# Patient Record
Sex: Female | Born: 1975
Health system: Southern US, Community
[De-identification: ages and names within clinical notes are randomized; demographics above are authoritative.]

## PROBLEM LIST (undated history)

## (undated) DIAGNOSIS — L4050 Arthropathic psoriasis, unspecified: Secondary | ICD-10-CM

## (undated) DIAGNOSIS — E119 Type 2 diabetes mellitus without complications: Secondary | ICD-10-CM

## (undated) DIAGNOSIS — R519 Headache, unspecified: Secondary | ICD-10-CM

## (undated) DIAGNOSIS — I10 Essential (primary) hypertension: Secondary | ICD-10-CM

## (undated) DIAGNOSIS — R112 Nausea with vomiting, unspecified: Secondary | ICD-10-CM

## (undated) DIAGNOSIS — Z9889 Other specified postprocedural states: Secondary | ICD-10-CM

## (undated) HISTORY — PX: BACK SURGERY: SHX140

## (undated) HISTORY — PX: KNEE SURGERY: SHX244

---

## 2015-11-18 ENCOUNTER — Ambulatory Visit: Payer: Self-pay | Admitting: Cardiovascular Disease

## 2016-03-31 DIAGNOSIS — G43109 Migraine with aura, not intractable, without status migrainosus: Secondary | ICD-10-CM | POA: Diagnosis not present

## 2016-03-31 DIAGNOSIS — R21 Rash and other nonspecific skin eruption: Secondary | ICD-10-CM | POA: Diagnosis not present

## 2016-03-31 DIAGNOSIS — M2550 Pain in unspecified joint: Secondary | ICD-10-CM | POA: Diagnosis not present

## 2016-03-31 DIAGNOSIS — E119 Type 2 diabetes mellitus without complications: Secondary | ICD-10-CM | POA: Diagnosis not present

## 2016-03-31 DIAGNOSIS — R768 Other specified abnormal immunological findings in serum: Secondary | ICD-10-CM | POA: Diagnosis not present

## 2016-03-31 DIAGNOSIS — F418 Other specified anxiety disorders: Secondary | ICD-10-CM | POA: Diagnosis not present

## 2016-03-31 DIAGNOSIS — I10 Essential (primary) hypertension: Secondary | ICD-10-CM | POA: Diagnosis not present

## 2016-04-12 DIAGNOSIS — L409 Psoriasis, unspecified: Secondary | ICD-10-CM | POA: Diagnosis not present

## 2016-04-12 DIAGNOSIS — L400 Psoriasis vulgaris: Secondary | ICD-10-CM | POA: Diagnosis not present

## 2016-04-21 DIAGNOSIS — Z79899 Other long term (current) drug therapy: Secondary | ICD-10-CM | POA: Diagnosis not present

## 2016-04-21 DIAGNOSIS — M25562 Pain in left knee: Secondary | ICD-10-CM | POA: Diagnosis not present

## 2016-04-21 DIAGNOSIS — R760 Raised antibody titer: Secondary | ICD-10-CM | POA: Diagnosis not present

## 2016-04-21 DIAGNOSIS — L4050 Arthropathic psoriasis, unspecified: Secondary | ICD-10-CM | POA: Diagnosis not present

## 2016-04-21 DIAGNOSIS — R7989 Other specified abnormal findings of blood chemistry: Secondary | ICD-10-CM | POA: Diagnosis not present

## 2016-04-21 DIAGNOSIS — M79641 Pain in right hand: Secondary | ICD-10-CM | POA: Diagnosis not present

## 2016-04-21 DIAGNOSIS — M25561 Pain in right knee: Secondary | ICD-10-CM | POA: Diagnosis not present

## 2016-04-21 DIAGNOSIS — M79642 Pain in left hand: Secondary | ICD-10-CM | POA: Diagnosis not present

## 2016-05-12 DIAGNOSIS — R7989 Other specified abnormal findings of blood chemistry: Secondary | ICD-10-CM | POA: Diagnosis not present

## 2016-05-12 DIAGNOSIS — L4050 Arthropathic psoriasis, unspecified: Secondary | ICD-10-CM | POA: Diagnosis not present

## 2016-05-12 DIAGNOSIS — Z79899 Other long term (current) drug therapy: Secondary | ICD-10-CM | POA: Diagnosis not present

## 2016-06-08 MED FILL — LABETALOL HCL 200 MG TABLET: 200 | 90 days supply | Qty: 90 | Fill #0

## 2016-06-08 MED FILL — SERTRALINE HCL 100 MG TAB: 100 | 90 days supply | Qty: 90 | Fill #0

## 2016-06-08 MED FILL — predniSONE 10 MG TABS: 10 | 30 days supply | Qty: 60 | Fill #0

## 2016-06-11 MED FILL — TRIAMCINOLONE 0.1% CREAM: 0.1 | 30 days supply | Qty: 454 | Fill #0

## 2016-06-11 MED FILL — metFORMIN HCL 500 MG TABS: 500 | 90 days supply | Qty: 90 | Fill #0

## 2016-06-11 MED FILL — SUMATRIPTAN SUCC 100 MG TAB: 100 | 30 days supply | Qty: 18 | Fill #0

## 2016-07-05 DIAGNOSIS — G43109 Migraine with aura, not intractable, without status migrainosus: Secondary | ICD-10-CM | POA: Diagnosis not present

## 2016-07-05 DIAGNOSIS — F418 Other specified anxiety disorders: Secondary | ICD-10-CM | POA: Diagnosis not present

## 2016-07-05 DIAGNOSIS — Z7984 Long term (current) use of oral hypoglycemic drugs: Secondary | ICD-10-CM | POA: Diagnosis not present

## 2016-07-05 DIAGNOSIS — E119 Type 2 diabetes mellitus without complications: Secondary | ICD-10-CM | POA: Diagnosis not present

## 2016-07-05 DIAGNOSIS — L4050 Arthropathic psoriasis, unspecified: Secondary | ICD-10-CM | POA: Diagnosis not present

## 2016-07-05 DIAGNOSIS — I10 Essential (primary) hypertension: Secondary | ICD-10-CM | POA: Diagnosis not present

## 2016-07-05 MED FILL — LOSARTAN-HCTZ 50-12.5 MG TA: 50-12.5 | 30 days supply | Qty: 30 | Fill #0

## 2016-08-03 MED FILL — TRIAMCINOLONE 0.1% CREAM: 0.1 | 30 days supply | Qty: 454 | Fill #1

## 2016-08-03 MED FILL — LOSARTAN-HCTZ 50-12.5 MG TA: 50-12.5 | 30 days supply | Qty: 30 | Fill #1

## 2016-09-06 MED FILL — LOSARTAN-HCTZ 50-12.5 MG TA: 50-12.5 | 30 days supply | Qty: 30 | Fill #2

## 2016-09-09 MED FILL — TRIAMCINOLONE 0.1% CREAM: 0.1 | 30 days supply | Qty: 454 | Fill #2

## 2016-09-09 MED FILL — SERTRALINE HCL 100 MG TAB: 100 | 90 days supply | Qty: 90 | Fill #1

## 2016-09-20 MED FILL — metFORMIN HCL 500 MG TABS: 500 | 90 days supply | Qty: 90 | Fill #1

## 2016-09-21 DIAGNOSIS — E119 Type 2 diabetes mellitus without complications: Secondary | ICD-10-CM | POA: Diagnosis not present

## 2016-09-21 DIAGNOSIS — R5383 Other fatigue: Secondary | ICD-10-CM | POA: Diagnosis not present

## 2016-09-21 DIAGNOSIS — M7989 Other specified soft tissue disorders: Secondary | ICD-10-CM | POA: Diagnosis not present

## 2016-09-21 DIAGNOSIS — M2550 Pain in unspecified joint: Secondary | ICD-10-CM | POA: Diagnosis not present

## 2016-09-21 DIAGNOSIS — L4050 Arthropathic psoriasis, unspecified: Secondary | ICD-10-CM | POA: Diagnosis not present

## 2016-09-21 DIAGNOSIS — L409 Psoriasis, unspecified: Secondary | ICD-10-CM | POA: Diagnosis not present

## 2016-09-21 DIAGNOSIS — Z6841 Body Mass Index (BMI) 40.0 and over, adult: Secondary | ICD-10-CM | POA: Diagnosis not present

## 2016-09-26 ENCOUNTER — Encounter: Payer: Self-pay | Admitting: Emergency Medicine

## 2016-09-26 ENCOUNTER — Emergency Department (INDEPENDENT_AMBULATORY_CARE_PROVIDER_SITE_OTHER)
Admission: EM | Admit: 2016-09-26 | Discharge: 2016-09-26 | Disposition: A | Discharge status: Home / Self Care | Payer: 59 | Source: Home / Self Care | Attending: Family Medicine | Admitting: Family Medicine

## 2016-09-26 DIAGNOSIS — L02211 Cutaneous abscess of abdominal wall: Secondary | ICD-10-CM | POA: Diagnosis not present

## 2016-09-26 HISTORY — DX: Essential (primary) hypertension: I10

## 2016-09-26 HISTORY — DX: Type 2 diabetes mellitus without complications: E11.9

## 2016-09-26 HISTORY — DX: Arthropathic psoriasis, unspecified: L40.50

## 2016-09-26 MED ORDER — OXYCODONE-ACETAMINOPHEN 5-325 MG PO TABS: 1.0000 | ORAL_TABLET | Freq: Four times a day (QID) | ORAL | 0 refills | Status: AC | PRN

## 2016-09-26 MED ORDER — CLINDAMYCIN HCL 300 MG PO CAPS: 300.0000 mg | ORAL_CAPSULE | Freq: Three times a day (TID) | ORAL | 0 refills | Status: AC

## 2016-09-26 NOTE — Discharge Instructions (Signed)
Leave bandage in place for 24 hours, then change daily until healed.  May apply heating pad 3 to 4 times daily.  May take Ibuprofen 200mg , 4 tabs every 8 hours with food.   If symptoms become significantly worse during the night or over the weekend, proceed to the local emergency room.

## 2016-09-26 NOTE — ED Triage Notes (Signed)
Patient presents to [facility] with a abscess to the right abdomen

## 2016-09-26 NOTE — ED Provider Notes (Signed)
[facility]    CSN: [id number] Arrival date & time: 09/26/16  1526     History   Chief Complaint Chief Complaint  Patient presents with  . Abscess    HPI Kirsten Rodriguez is a 41 y.o. female.   Patient reports the onset of a "bump" on her right lower abdomen one week ago.  The area became increasingly painful and swollen, especially during the past three days.  She has psoriasis, and has several plaques on her abdomen.  She has had sweats during the past 24 hours.   The history is provided by the patient.  Abscess  Abscess location: right lower abdomen. Size:  3cm by 6cm Abscess quality: draining, fluctuance, induration, painful, redness, warmth and weeping   Abscess quality: no itching   Red streaking: no   Duration:  1 week Progression:  Worsening Pain details:    Quality:  Aching   Severity:  Moderate   Duration:  1 week   Timing:  Constant   Progression:  Worsening Chronicity:  New Context: not skin injury   Relieved by:  Nothing Worsened by:  Draining/squeezing Ineffective treatments:  None tried Associated symptoms: fatigue   Associated symptoms: no fever, no headaches, no nausea and no vomiting     Past Medical History:  Diagnosis Date  . Diabetes mellitus without complication ([place])   . Hypertension   . Psoriatic arthritis ([place])     There are no active problems to display for this patient.   Past Surgical History:  Procedure Laterality Date  . BACK SURGERY    . KNEE SURGERY      OB History    No data available       Home Medications    Prior to Admission medications   Medication Sig Start Date End Date Taking? Authorizing Provider  losartan-hydrochlorothiazide (HYZAAR) 50-12.5 MG tablet Take 1 tablet by mouth daily.   Yes [provider]  metFORMIN (GLUCOPHAGE) 500 MG tablet Take by mouth 2 (two) times daily with a meal.   Yes [provider]  sertraline (ZOLOFT) 50 MG tablet Take 50 mg by mouth daily.   Yes  [provider]  SUMAtriptan (IMITREX) 50 MG tablet Take 50 mg by mouth every 2 (two) hours as needed for migraine. May repeat in 2 hours if headache persists or recurs.   Yes [provider]  clindamycin (CLEOCIN) 300 MG capsule Take 1 capsule (300 mg total) by mouth 3 (three) times daily. 09/26/16 10/06/16  Kandra Nicolas, MD  oxyCODONE-acetaminophen (ROXICET) 5-325 MG tablet Take 1 tablet by mouth every 6 (six) hours as needed for moderate pain. 09/26/16 09/26/17  Kandra Nicolas, MD    Family History History reviewed. No pertinent family history.  Social History Social History  Substance Use Topics  . Smoking status: Former Research scientist (life sciences)  . Smokeless tobacco: Former Systems developer  . Alcohol use Yes     Comment: 5-6 per week      Allergies   Patient has no known allergies.   Review of Systems Review of Systems  Constitutional: Positive for fatigue. Negative for fever.  Gastrointestinal: Negative for nausea and vomiting.  Neurological: Negative for headaches.  All other systems reviewed and are negative.    Physical Exam Triage Vital Signs ED Triage Vitals  Enc Vitals Group     BP 09/26/16 1549 116/81     Pulse Rate 09/26/16 1549 (!) 118     Resp 09/26/16 1549 16     Temp  09/26/16 1549 98.2 F (36.8 C)     Temp Source 09/26/16 1549 Oral     SpO2 09/26/16 1549 98 %     Weight 09/26/16 1552 286 lb (129.7 kg)     Height 09/26/16 1552 5\' 9"  (1.753 m)     Head Circumference --      Peak Flow --      Pain Score 09/26/16 1552 7     Pain Loc --      Pain Edu? --      Excl. in [place]? --    No data found.   Updated Vital Signs BP 116/81 (BP Location: Left Arm)   Pulse (!) 118   Temp 98.2 F (36.8 C) (Oral)   Resp 16   Ht 5\' 9"  (1.753 m)   Wt 286 lb (129.7 kg)   LMP 09/08/2016   SpO2 98%   BMI 42.23 kg/m   Visual Acuity Right Eye Distance:   Left Eye Distance:   Bilateral Distance:    Right Eye Near:   Left Eye Near:    Bilateral Near:     Physical Exam    Constitutional: She appears well-developed and well-nourished. No distress.  HENT:  Head: Normocephalic.  Right Ear: External ear normal.  Left Ear: External ear normal.  Nose: Nose normal.  Mouth/Throat: Oropharynx is clear and moist.  Eyes: Pupils are equal, round, and reactive to light. Conjunctivae are normal.  Neck: Neck supple.  Cardiovascular: Normal heart sounds.   Pulmonary/Chest: Breath sounds normal.  Abdominal: Bowel sounds are normal.    Area of tenderness and fluctuance right lower quadrant surrounded by erythema as noted on diagram.  There are several scattered psoriatic plaques on abdomen.  Lymphadenopathy:    She has no cervical adenopathy.  Neurological: She is alert.  Skin: Skin is warm and dry.  Nursing note and vitals reviewed.    UC Treatments / Results  Labs (all labs ordered are listed, but only abnormal results are displayed) Labs Reviewed  WOUND CULTURE    EKG  EKG Interpretation None       Radiology No results found.  Procedures Procedures  Incise and drain cyst/abscess Risks and benefits of procedure explained to patient and verbal consent obtained.  Using sterile technique, applied topical refrigerant spray followed by local anesthesia with 1% lidocaine with epinephrine, and cleansed affected area with Betadine and saline. Identified the most fluctuant area of lesion and incised with #11 blade.    Expressed blood and purulent material.  Inserted Iodoform gauze packing.  Bandage applied.  Patient tolerated well   Medications Ordered in UC Medications - No data to display   Initial Impression / Assessment and Plan / UC Course  I have reviewed the triage vital signs and the nursing notes.  Pertinent labs & imaging results that were available during my care of the patient were reviewed by me and considered in my medical decision making (see chart for details).    Patient's initial cellulitis may have been a result of infected psoriatic  plaque. Wound culture pending. Begin Clindamycin 300mg  TID for staph coverage. Rx for Percocet (#12, no ref) Leave bandage in place for 24 hours, then change daily until healed.  May apply heating pad 3 to 4 times daily.  May take Ibuprofen 200mg , 4 tabs every 8 hours with food.   If symptoms become significantly worse during the night or over the weekend, proceed to the local emergency room.  Return 24 hours for dressing change  and packing removal.    Final Clinical Impressions(s) / UC Diagnoses   Final diagnoses:  Abscess of skin of abdomen    New Prescriptions New Prescriptions   CLINDAMYCIN (CLEOCIN) 300 MG CAPSULE    Take 1 capsule (300 mg total) by mouth 3 (three) times daily.   OXYCODONE-ACETAMINOPHEN (ROXICET) 5-325 MG TABLET    Take 1 tablet by mouth every 6 (six) hours as needed for moderate pain.     Kandra Nicolas, MD 10/08/16 [phone]

## 2016-09-29 ENCOUNTER — Telehealth: Payer: Self-pay

## 2016-09-29 LAB — WOUND CULTURE
GRAM STAIN: NONE SEEN
Gram Stain: NONE SEEN

## 2016-09-29 NOTE — Telephone Encounter (Signed)
Called patient, notified of wound cx results, and to continue tx until finished.  Will follow up as needed.

## 2016-10-04 DIAGNOSIS — L02211 Cutaneous abscess of abdominal wall: Secondary | ICD-10-CM | POA: Diagnosis not present

## 2016-10-04 DIAGNOSIS — M2550 Pain in unspecified joint: Secondary | ICD-10-CM | POA: Diagnosis not present

## 2016-10-04 DIAGNOSIS — L4050 Arthropathic psoriasis, unspecified: Secondary | ICD-10-CM | POA: Diagnosis not present

## 2016-10-04 DIAGNOSIS — E119 Type 2 diabetes mellitus without complications: Secondary | ICD-10-CM | POA: Diagnosis not present

## 2016-10-04 DIAGNOSIS — R5383 Other fatigue: Secondary | ICD-10-CM | POA: Diagnosis not present

## 2016-10-04 DIAGNOSIS — M7989 Other specified soft tissue disorders: Secondary | ICD-10-CM | POA: Diagnosis not present

## 2016-10-04 DIAGNOSIS — L409 Psoriasis, unspecified: Secondary | ICD-10-CM | POA: Diagnosis not present

## 2016-10-04 DIAGNOSIS — Z6841 Body Mass Index (BMI) 40.0 and over, adult: Secondary | ICD-10-CM | POA: Diagnosis not present

## 2016-10-04 MED FILL — SULFAMETHOXAZOLE/TMP DS TAB: 800-160 | 10 days supply | Qty: 20 | Fill #0

## 2016-10-19 MED FILL — LOSARTAN-HCTZ 50-12.5 MG TA: 50-12.5 | 30 days supply | Qty: 30 | Fill #0

## 2016-11-09 DIAGNOSIS — E119 Type 2 diabetes mellitus without complications: Secondary | ICD-10-CM | POA: Diagnosis not present

## 2016-11-09 DIAGNOSIS — I10 Essential (primary) hypertension: Secondary | ICD-10-CM | POA: Diagnosis not present

## 2016-11-09 DIAGNOSIS — Z7984 Long term (current) use of oral hypoglycemic drugs: Secondary | ICD-10-CM | POA: Diagnosis not present

## 2016-11-16 DIAGNOSIS — M5441 Lumbago with sciatica, right side: Secondary | ICD-10-CM | POA: Diagnosis not present

## 2016-11-16 DIAGNOSIS — G8929 Other chronic pain: Secondary | ICD-10-CM | POA: Diagnosis not present

## 2016-11-22 MED FILL — SUMATRIPTAN SUCC 100 MG TAB: 100 | 90 days supply | Qty: 24 | Fill #0

## 2016-11-22 MED FILL — LOSARTAN-HCTZ 50-12.5 MG TA: 50-12.5 | 90 days supply | Qty: 90 | Fill #0

## 2016-12-14 DIAGNOSIS — J069 Acute upper respiratory infection, unspecified: Secondary | ICD-10-CM | POA: Diagnosis not present

## 2016-12-20 MED FILL — metFORMIN HCL 500 MG TABS: 500 | 90 days supply | Qty: 90 | Fill #0

## 2016-12-30 MED FILL — SERTRALINE HCL 100 MG TAB: 100 | 90 days supply | Qty: 90 | Fill #2

## 2017-01-04 DIAGNOSIS — R5383 Other fatigue: Secondary | ICD-10-CM | POA: Diagnosis not present

## 2017-01-04 DIAGNOSIS — Z6841 Body Mass Index (BMI) 40.0 and over, adult: Secondary | ICD-10-CM | POA: Diagnosis not present

## 2017-01-04 DIAGNOSIS — E119 Type 2 diabetes mellitus without complications: Secondary | ICD-10-CM | POA: Diagnosis not present

## 2017-01-04 DIAGNOSIS — L02211 Cutaneous abscess of abdominal wall: Secondary | ICD-10-CM | POA: Diagnosis not present

## 2017-01-04 DIAGNOSIS — M2550 Pain in unspecified joint: Secondary | ICD-10-CM | POA: Diagnosis not present

## 2017-01-04 DIAGNOSIS — L409 Psoriasis, unspecified: Secondary | ICD-10-CM | POA: Diagnosis not present

## 2017-01-04 DIAGNOSIS — L4050 Arthropathic psoriasis, unspecified: Secondary | ICD-10-CM | POA: Diagnosis not present

## 2017-02-21 MED FILL — LOSARTAN-HCTZ 50-12.5 MG TA: 50-12.5 | 90 days supply | Qty: 90 | Fill #1

## 2017-03-04 DIAGNOSIS — M545 Low back pain: Secondary | ICD-10-CM | POA: Diagnosis not present

## 2017-03-12 DIAGNOSIS — M5416 Radiculopathy, lumbar region: Secondary | ICD-10-CM | POA: Diagnosis not present

## 2017-03-17 MED FILL — metFORMIN HCL 500 MG TABS: 500 | 90 days supply | Qty: 90 | Fill #1

## 2017-03-18 DIAGNOSIS — M545 Low back pain: Secondary | ICD-10-CM | POA: Diagnosis not present

## 2017-03-21 DIAGNOSIS — M545 Low back pain: Secondary | ICD-10-CM | POA: Diagnosis not present

## 2017-03-22 DIAGNOSIS — M5416 Radiculopathy, lumbar region: Secondary | ICD-10-CM | POA: Diagnosis not present

## 2017-03-29 MED FILL — SERTRALINE HCL 100 MG TAB: 100 | 90 days supply | Qty: 90 | Fill #3

## 2017-04-06 DIAGNOSIS — M5416 Radiculopathy, lumbar region: Secondary | ICD-10-CM | POA: Diagnosis not present

## 2017-04-21 DIAGNOSIS — M47816 Spondylosis without myelopathy or radiculopathy, lumbar region: Secondary | ICD-10-CM | POA: Diagnosis not present

## 2017-05-20 MED FILL — TRIAMCINOLONE ACETONIDE 0.1: 0.1 | 30 days supply | Qty: 454 | Fill #3

## 2017-05-24 MED FILL — LOSARTAN-HCTZ 50-12.5 MG TA: 50-12.5 | 90 days supply | Qty: 90 | Fill #0

## 2017-06-02 DIAGNOSIS — E119 Type 2 diabetes mellitus without complications: Secondary | ICD-10-CM | POA: Diagnosis not present

## 2017-06-02 DIAGNOSIS — I10 Essential (primary) hypertension: Secondary | ICD-10-CM | POA: Diagnosis not present

## 2017-06-08 DIAGNOSIS — Z13220 Encounter for screening for lipoid disorders: Secondary | ICD-10-CM | POA: Diagnosis not present

## 2017-06-08 DIAGNOSIS — R748 Abnormal levels of other serum enzymes: Secondary | ICD-10-CM | POA: Diagnosis not present

## 2017-06-08 DIAGNOSIS — R7309 Other abnormal glucose: Secondary | ICD-10-CM | POA: Diagnosis not present

## 2017-06-09 ENCOUNTER — Other Ambulatory Visit: Payer: Self-pay | Admitting: Family Medicine

## 2017-06-09 DIAGNOSIS — R748 Abnormal levels of other serum enzymes: Secondary | ICD-10-CM

## 2017-06-09 MED FILL — metFORMIN HCL 500 MG TABS: 500 | 90 days supply | Qty: 360 | Fill #0

## 2017-06-13 ENCOUNTER — Encounter (HOSPITAL_BASED_OUTPATIENT_CLINIC_OR_DEPARTMENT_OTHER): Payer: Self-pay

## 2017-06-13 ENCOUNTER — Ambulatory Visit (HOSPITAL_BASED_OUTPATIENT_CLINIC_OR_DEPARTMENT_OTHER)
Admission: RE | Admit: 2017-06-13 | Discharge: 2017-06-13 | Disposition: A | Discharge status: Home / Self Care | Payer: 59 | Source: Ambulatory Visit | Attending: Family Medicine | Admitting: Family Medicine

## 2017-06-13 DIAGNOSIS — R748 Abnormal levels of other serum enzymes: Secondary | ICD-10-CM | POA: Insufficient documentation

## 2017-06-13 DIAGNOSIS — R161 Splenomegaly, not elsewhere classified: Secondary | ICD-10-CM | POA: Insufficient documentation

## 2017-06-13 DIAGNOSIS — K8020 Calculus of gallbladder without cholecystitis without obstruction: Secondary | ICD-10-CM | POA: Diagnosis not present

## 2017-06-16 DIAGNOSIS — R748 Abnormal levels of other serum enzymes: Secondary | ICD-10-CM | POA: Diagnosis not present

## 2017-06-30 DIAGNOSIS — R1011 Right upper quadrant pain: Secondary | ICD-10-CM | POA: Diagnosis not present

## 2017-06-30 DIAGNOSIS — R748 Abnormal levels of other serum enzymes: Secondary | ICD-10-CM | POA: Diagnosis not present

## 2017-06-30 DIAGNOSIS — E119 Type 2 diabetes mellitus without complications: Secondary | ICD-10-CM | POA: Diagnosis not present

## 2017-06-30 DIAGNOSIS — R161 Splenomegaly, not elsewhere classified: Secondary | ICD-10-CM | POA: Diagnosis not present

## 2017-06-30 DIAGNOSIS — Z124 Encounter for screening for malignant neoplasm of cervix: Secondary | ICD-10-CM | POA: Diagnosis not present

## 2017-06-30 DIAGNOSIS — Z0000 Encounter for general adult medical examination without abnormal findings: Secondary | ICD-10-CM | POA: Diagnosis not present

## 2017-07-08 MED FILL — SERTRALINE HCL 100 MG TAB: 100 | 30 days supply | Qty: 30 | Fill #0

## 2017-07-12 ENCOUNTER — Other Ambulatory Visit: Payer: Self-pay | Admitting: Family Medicine

## 2017-07-12 DIAGNOSIS — R161 Splenomegaly, not elsewhere classified: Secondary | ICD-10-CM

## 2017-08-05 MED FILL — SUMATRIPTAN SUCC 100 MG TAB: 100 | 30 days supply | Qty: 9 | Fill #0

## 2017-08-22 MED FILL — SERTRALINE HCL 100 MG TAB: 100 | 30 days supply | Qty: 30 | Fill #1

## 2017-08-23 MED FILL — LOSARTAN-HCTZ 50-12.5 MG TA: 50-12.5 | 90 days supply | Qty: 90 | Fill #0

## 2017-09-14 MED FILL — SUMATRIPTAN SUCC 100 MG TAB: 100 | 30 days supply | Qty: 9 | Fill #1

## 2017-09-23 MED FILL — SERTRALINE HCL 100 MG TAB: 100 | 30 days supply | Qty: 30 | Fill #2

## 2017-09-23 MED FILL — metFORMIN HCL 500 MG TABS: 500 | 90 days supply | Qty: 360 | Fill #1

## 2017-10-17 MED FILL — SUMATRIPTAN SUCC 100 MG TAB: 100 | 30 days supply | Qty: 9 | Fill #2

## 2017-11-03 MED FILL — SERTRALINE HCL 100 MG TAB: 100 | 30 days supply | Qty: 30 | Fill #3

## 2017-11-21 MED FILL — LOSARTAN-HCTZ 50-12.5 MG TA: 50-12.5 | 90 days supply | Qty: 90 | Fill #0

## 2017-11-29 ENCOUNTER — Telehealth: Payer: Self-pay | Admitting: Pharmacist

## 2017-11-29 DIAGNOSIS — Z9119 Patient's noncompliance with other medical treatment and regimen: Secondary | ICD-10-CM | POA: Diagnosis not present

## 2017-11-29 DIAGNOSIS — L409 Psoriasis, unspecified: Secondary | ICD-10-CM | POA: Diagnosis not present

## 2017-11-29 DIAGNOSIS — L4050 Arthropathic psoriasis, unspecified: Secondary | ICD-10-CM | POA: Diagnosis not present

## 2017-11-29 DIAGNOSIS — Z6841 Body Mass Index (BMI) 40.0 and over, adult: Secondary | ICD-10-CM | POA: Diagnosis not present

## 2017-11-29 DIAGNOSIS — R5383 Other fatigue: Secondary | ICD-10-CM | POA: Diagnosis not present

## 2017-11-29 DIAGNOSIS — L02211 Cutaneous abscess of abdominal wall: Secondary | ICD-10-CM | POA: Diagnosis not present

## 2017-11-29 DIAGNOSIS — E119 Type 2 diabetes mellitus without complications: Secondary | ICD-10-CM | POA: Diagnosis not present

## 2017-11-29 DIAGNOSIS — M2550 Pain in unspecified joint: Secondary | ICD-10-CM | POA: Diagnosis not present

## 2017-11-29 NOTE — Telephone Encounter (Signed)
Called patient to schedule an appointment for the [facility]. I was unable to reach the patient so I left a HIPAA-compliant message requesting that the patient return my call.   

## 2017-12-06 NOTE — Telephone Encounter (Signed)
Called patient to schedule an appointment for the [place] [facility]. I was unable to reach the patient so I left a HIPAA-compliant message requesting that the patient return my call. Second attempt.  

## 2017-12-09 ENCOUNTER — Ambulatory Visit: Payer: 59 | Admitting: Pharmacist

## 2017-12-09 DIAGNOSIS — Z79899 Other long term (current) drug therapy: Secondary | ICD-10-CM

## 2017-12-09 MED ORDER — IXEKIZUMAB 80 MG/ML ~~LOC~~ SOSY: 1.0000 mL | PREFILLED_SYRINGE | SUBCUTANEOUS | 2 refills | Status: DC

## 2017-12-09 NOTE — Progress Notes (Signed)
   S: Patient presents to [facility] [place] for review of their specialty medication therapy.  Patient is currently taking Taltz for psoriatic arthritis. Patient is managed by Marella Chimes for this.   Adherence: denies any issues - has taken in the past  Efficacy: reports that it worked well for her in the past. Did not have any adverse effects while on it.   Dosing:  Psoriatic arthritis: SubQ: 160 mg once, followed by 80 mg every 4 weeks; may administer alone or in combination with conventional disease-modifying antirheumatic drugs (eg, methotrexate).   Dose adjustments: Renal: no dose adjustments (has not been studied) Hepatic: no dose adjustments (has not been studied)   Screening: TB test: completed per patient  Monitoring: S/sx of infection: denies CBC: monitored by [facility] rheumatology S/sx of hypersensitivity: denies   O:     No results found for: WBC, HGB, HCT, MCV, PLT    Chemistry   No results found for: NA, K, CL, CO2, BUN, CREATININE, GLU No results found for: CALCIUM, ALKPHOS, AST, ALT, BILITOT     A/P: 1. Medication review: Patient currently on [place] for psoriatic arthritis. Reviewed the medication with the patient, including the following: Donnetta Hail is a monoclonal antibody used in the treatment of ankylosing spondylitis, plaque psoriasis, and psoriatic arthritis. Administration instructions were provided: Subcutaneous: Allow to reach room temperature prior to injection (30 minutes). Do not shake. Inject full amount into the upper arms, thighs or any quadrant of the abdomen; administer each injection at a different anatomic location than a previous injection and avoid areas where the skin is tender, bruised, erythematous, indurated, or affected by psoriasis. Administration in the upper, outer arm may be performed by a caregiver or health care provider. Ixekizumab is intended for use under the guidance and supervision of a physician; may be self-injected by the  patient following proper training in SubQ injection technique. Possible adverse effects include neutropenia, increased risk of infection, local injection site reaction, and antibody development (loss of efficacy of drug). May increase risk of Crohn disease or ulcerative colitis. Live vaccinations should be avoided. No recommendations for any changes.     Christella Hartigan, PharmD, BCPS, BCACP, CPP Clinical Pharmacist Practitioner  [phone]

## 2017-12-12 MED FILL — SUMAtriptan SUCCINATE 100 M: 100 | 90 days supply | Qty: 27 | Fill #0

## 2017-12-12 MED FILL — SERTRALINE HCL 100 MG TAB: 100 | 30 days supply | Qty: 30 | Fill #0

## 2017-12-21 DIAGNOSIS — I10 Essential (primary) hypertension: Secondary | ICD-10-CM | POA: Diagnosis not present

## 2017-12-21 DIAGNOSIS — R300 Dysuria: Secondary | ICD-10-CM | POA: Diagnosis not present

## 2017-12-21 DIAGNOSIS — L4050 Arthropathic psoriasis, unspecified: Secondary | ICD-10-CM | POA: Diagnosis not present

## 2017-12-21 DIAGNOSIS — Z23 Encounter for immunization: Secondary | ICD-10-CM | POA: Diagnosis not present

## 2017-12-21 DIAGNOSIS — K8020 Calculus of gallbladder without cholecystitis without obstruction: Secondary | ICD-10-CM | POA: Diagnosis not present

## 2017-12-21 DIAGNOSIS — K760 Fatty (change of) liver, not elsewhere classified: Secondary | ICD-10-CM | POA: Diagnosis not present

## 2017-12-21 DIAGNOSIS — B370 Candidal stomatitis: Secondary | ICD-10-CM | POA: Diagnosis not present

## 2017-12-21 DIAGNOSIS — E119 Type 2 diabetes mellitus without complications: Secondary | ICD-10-CM | POA: Diagnosis not present

## 2017-12-21 DIAGNOSIS — R748 Abnormal levels of other serum enzymes: Secondary | ICD-10-CM | POA: Diagnosis not present

## 2017-12-26 ENCOUNTER — Other Ambulatory Visit (HOSPITAL_COMMUNITY): Payer: Self-pay | Admitting: Family Medicine

## 2017-12-26 DIAGNOSIS — R161 Splenomegaly, not elsewhere classified: Secondary | ICD-10-CM

## 2017-12-26 DIAGNOSIS — R945 Abnormal results of liver function studies: Principal | ICD-10-CM

## 2017-12-26 DIAGNOSIS — R7989 Other specified abnormal findings of blood chemistry: Secondary | ICD-10-CM

## 2018-01-03 ENCOUNTER — Encounter (HOSPITAL_COMMUNITY): Payer: Self-pay

## 2018-01-03 ENCOUNTER — Ambulatory Visit (HOSPITAL_COMMUNITY)
Admission: RE | Admit: 2018-01-03 | Discharge: 2018-01-03 | Disposition: A | Discharge status: Home / Self Care | Payer: 59 | Source: Ambulatory Visit | Attending: Family Medicine | Admitting: Family Medicine

## 2018-01-03 ENCOUNTER — Other Ambulatory Visit (HOSPITAL_COMMUNITY): Payer: Self-pay | Admitting: Family Medicine

## 2018-01-03 DIAGNOSIS — R162 Hepatomegaly with splenomegaly, not elsewhere classified: Secondary | ICD-10-CM | POA: Insufficient documentation

## 2018-01-03 DIAGNOSIS — R161 Splenomegaly, not elsewhere classified: Secondary | ICD-10-CM

## 2018-01-03 DIAGNOSIS — K760 Fatty (change of) liver, not elsewhere classified: Secondary | ICD-10-CM | POA: Diagnosis not present

## 2018-01-03 DIAGNOSIS — R7989 Other specified abnormal findings of blood chemistry: Secondary | ICD-10-CM

## 2018-01-03 DIAGNOSIS — R945 Abnormal results of liver function studies: Secondary | ICD-10-CM | POA: Diagnosis present

## 2018-01-03 DIAGNOSIS — R748 Abnormal levels of other serum enzymes: Secondary | ICD-10-CM | POA: Diagnosis not present

## 2018-01-03 LAB — POCT I-STAT CREATININE: Creatinine, Ser: 0.5 mg/dL (ref 0.44–1.00)

## 2018-01-03 MED ORDER — IOHEXOL 300 MG/ML  SOLN
100.0000 mL | Freq: Once | INTRAMUSCULAR | Status: AC | PRN
  Administered 2018-01-03: 100 mL via INTRAVENOUS

## 2018-01-04 ENCOUNTER — Other Ambulatory Visit (HOSPITAL_COMMUNITY): Payer: Self-pay | Admitting: Family Medicine

## 2018-01-04 DIAGNOSIS — R160 Hepatomegaly, not elsewhere classified: Secondary | ICD-10-CM

## 2018-01-06 ENCOUNTER — Ambulatory Visit (HOSPITAL_COMMUNITY)
Admission: RE | Admit: 2018-01-06 | Discharge: 2018-01-06 | Disposition: A | Discharge status: Home / Self Care | Payer: 59 | Source: Ambulatory Visit | Attending: Family Medicine | Admitting: Family Medicine

## 2018-01-06 DIAGNOSIS — R162 Hepatomegaly with splenomegaly, not elsewhere classified: Secondary | ICD-10-CM | POA: Diagnosis not present

## 2018-01-06 DIAGNOSIS — R160 Hepatomegaly, not elsewhere classified: Secondary | ICD-10-CM

## 2018-01-06 DIAGNOSIS — K760 Fatty (change of) liver, not elsewhere classified: Secondary | ICD-10-CM | POA: Diagnosis not present

## 2018-01-06 DIAGNOSIS — K8020 Calculus of gallbladder without cholecystitis without obstruction: Secondary | ICD-10-CM | POA: Insufficient documentation

## 2018-01-06 DIAGNOSIS — K769 Liver disease, unspecified: Secondary | ICD-10-CM | POA: Insufficient documentation

## 2018-01-06 DIAGNOSIS — I868 Varicose veins of other specified sites: Secondary | ICD-10-CM | POA: Diagnosis not present

## 2018-01-06 MED ORDER — GADOXETATE DISODIUM 0.25 MMOL/ML IV SOLN
10.0000 mL | Freq: Once | INTRAVENOUS | Status: AC
  Administered 2018-01-06: 10 mL via INTRAVENOUS

## 2018-01-18 MED FILL — SERTRALINE HCL 100 MG TAB: 100 | 30 days supply | Qty: 30 | Fill #0

## 2018-02-09 MED FILL — metFORMIN HCL 500 MG TABS: 500 | 90 days supply | Qty: 360 | Fill #2

## 2018-02-17 MED FILL — LOSARTAN POTASSIUM 50 MG TA: 50 | 90 days supply | Qty: 90 | Fill #0

## 2018-02-17 MED FILL — HYDROCHLOROTHIAZIDE 12.5 MG: 12.5 | 90 days supply | Qty: 90 | Fill #0

## 2018-03-02 MED FILL — SERTRALINE HCL 100 MG TAB: 100 | 30 days supply | Qty: 30 | Fill #0

## 2018-03-21 ENCOUNTER — Telehealth: Payer: 59 | Admitting: Family Medicine

## 2018-03-21 DIAGNOSIS — J069 Acute upper respiratory infection, unspecified: Secondary | ICD-10-CM

## 2018-03-21 MED ORDER — FLUTICASONE PROPIONATE 50 MCG/ACT NA SUSP: 2.0000 | Freq: Every day | NASAL | 0 refills | Status: DC

## 2018-03-21 MED ORDER — BENZONATATE 100 MG PO CAPS: 100.0000 mg | ORAL_CAPSULE | Freq: Two times a day (BID) | ORAL | 0 refills | Status: DC | PRN

## 2018-03-21 MED FILL — FLUTICASONE PROP 50 MCG SPR: 50 | 30 days supply | Qty: 16 | Fill #0

## 2018-03-21 MED FILL — BENZONATATE 100 MG CAPS: 100 | 10 days supply | Qty: 20 | Fill #0

## 2018-03-21 NOTE — Progress Notes (Signed)
We are sorry you are not feeling well.  Here is how we plan to help!  Based on what you have shared with me, it looks like you may have a viral upper respiratory infection or a "common cold".  Colds are caused by a large number of viruses; however, rhinovirus is the most common cause.   Symptoms of the common cold vary from person to person, with common symptoms including sore throat, cough, and malaise.  A low-grade fever of 100.4 may present, but is often uncommon.  Symptoms vary however, and are closely related to a person's age or underlying illnesses.  The most common symptoms associated with the common cold are nasal discharge or congestion, cough, sneezing, headache and pressure in the ears and face.  Cold symptoms usually persist for about 3 to 10 days, but can last up to 2 weeks.  It is important to know that colds do not cause serious illness or complications in most cases.    The common cold is transmitted from person to person, with the most common method of transmission being a person's hands.  The virus is able to live on the skin and can infect other persons for up to 2 hours after direct contact.  Also, colds are transmitted when someone coughs or sneezes; thus, it is important to cover the mouth to reduce this risk.  To keep the spread of the common cold at [place], good hand hygiene is very important.  This is an infection that is most likely caused by a virus. There are no specific treatments for the common cold other than to help you with the symptoms until the infection runs its course.    For nasal congestion, you may use an oral decongestants such as Mucinex D or if you have glaucoma or high blood pressure use plain Mucinex.  Saline nasal spray or nasal drops can help and can safely be used as often as needed for congestion.  For your congestion, I have prescribed Fluticasone nasal spray one spray in each nostril twice a day  If you do not have a history of heart disease, hypertension,  diabetes or thyroid disease, prostate/bladder issues or glaucoma, you may also use Sudafed to treat nasal congestion.  It is highly recommended that you consult with a pharmacist or your primary care physician to ensure this medication is safe for you to take.     If you have a cough, you may use cough suppressants such as Delsym and Robitussin.  If you have glaucoma or high blood pressure, you can also use Coricidin HBP.   For cough I have prescribed for you A prescription cough medication called Tessalon Perles 100 mg. You may take 1-2 capsules every 8 hours as needed for cough  If you have a sore or scratchy throat, use a saltwater gargle-  to  teaspoon of salt dissolved in a 4-ounce to 8-ounce glass of warm water.  Gargle the solution for approximately 15-30 seconds and then spit.  It is important not to swallow the solution.  You can also use throat lozenges/cough drops and Chloraseptic spray to help with throat pain or discomfort.  Warm or cold liquids can also be helpful in relieving throat pain.  For headache, pain or general discomfort, you can use Ibuprofen or Tylenol as directed.   Some authorities believe that zinc sprays or the use of Echinacea may shorten the course of your symptoms.   HOME CARE . Only take medications as instructed by your  medical team. . Be sure to drink plenty of fluids. Water is fine as well as fruit juices, sodas and electrolyte beverages. You may want to stay away from caffeine or alcohol. If you are nauseated, try taking small sips of liquids. How do you know if you are getting enough fluid? Your urine should be a pale yellow or almost colorless. . Get rest. . Taking a steamy shower or using a humidifier may help nasal congestion and ease sore throat pain. You can place a towel over your head and breathe in the steam from hot water coming from a faucet. . Using a saline nasal spray works much the same way. . Cough drops, hard candies and sore throat lozenges  may ease your cough. . Avoid close contacts especially the very young and the elderly . Cover your mouth if you cough or sneeze . Always remember to wash your hands.   GET HELP RIGHT AWAY IF: . You develop worsening fever. . If your symptoms do not improve within 10 days . You develop yellow or green discharge from your nose over 3 days. . You have coughing fits . You develop a severe head ache or visual changes. . You develop shortness of breath, difficulty breathing or start having chest pain . Your symptoms persist after you have completed your treatment plan  MAKE SURE YOU   Understand these instructions.  Will watch your condition.  Will get help right away if you are not doing well or get worse.  Your e-visit answers were reviewed by a board certified advanced clinical practitioner to complete your personal care plan. Depending upon the condition, your plan could have included both over the counter or prescription medications. Please review your pharmacy choice. If there is a problem, you may call our nursing hot line at and have the prescription routed to another pharmacy. Your safety is important to [place]. If you have drug allergies check your prescription carefully.   You can use MyChart to ask questions about today's visit, request a non-urgent call back, or ask for a work or school excuse for 24 hours related to this e-Visit. If it has been greater than 24 hours you will need to follow up with your provider, or enter a new e-Visit to address those concerns. You will get an e-mail in the next two days asking about your experience.  I hope that your e-visit has been valuable and will speed your recovery. Thank you for using e-visits.

## 2018-03-28 MED FILL — AMOXICILLIN 500 MG CAPSULE: 500 | 10 days supply | Qty: 20 | Fill #0

## 2018-04-24 DIAGNOSIS — L409 Psoriasis, unspecified: Secondary | ICD-10-CM | POA: Diagnosis not present

## 2018-04-24 DIAGNOSIS — Z6841 Body Mass Index (BMI) 40.0 and over, adult: Secondary | ICD-10-CM | POA: Diagnosis not present

## 2018-04-24 DIAGNOSIS — R5383 Other fatigue: Secondary | ICD-10-CM | POA: Diagnosis not present

## 2018-04-24 DIAGNOSIS — E119 Type 2 diabetes mellitus without complications: Secondary | ICD-10-CM | POA: Diagnosis not present

## 2018-04-24 DIAGNOSIS — L4050 Arthropathic psoriasis, unspecified: Secondary | ICD-10-CM | POA: Diagnosis not present

## 2018-04-24 DIAGNOSIS — Z9119 Patient's noncompliance with other medical treatment and regimen: Secondary | ICD-10-CM | POA: Diagnosis not present

## 2018-04-24 DIAGNOSIS — L02211 Cutaneous abscess of abdominal wall: Secondary | ICD-10-CM | POA: Diagnosis not present

## 2018-04-24 DIAGNOSIS — M2550 Pain in unspecified joint: Secondary | ICD-10-CM | POA: Diagnosis not present

## 2018-05-19 MED FILL — SUMATRIPTAN SUCC 100 MG TAB: 100 | 90 days supply | Qty: 27 | Fill #0

## 2018-05-19 MED FILL — SERTRALINE HCL 100 MG TAB: 100 | 90 days supply | Qty: 90 | Fill #0

## 2018-05-23 MED FILL — LOSARTAN POTASSIUM 50 MG TA: 50 | 30 days supply | Qty: 30 | Fill #0

## 2018-05-23 MED FILL — HYDROCHLOROTHIAZIDE 12.5 MG: 12.5 | 30 days supply | Qty: 30 | Fill #0

## 2018-06-26 MED FILL — LOSARTAN POTASSIUM 50 MG TA: 50 | 30 days supply | Qty: 30 | Fill #1

## 2018-06-26 MED FILL — HYDROCHLOROTHIAZIDE 12.5 MG: 12.5 | 30 days supply | Qty: 30 | Fill #1

## 2018-07-20 MED FILL — LOSARTAN POTASSIUM 50 MG TA: 50 | 30 days supply | Qty: 30 | Fill #0

## 2018-07-20 MED FILL — HYDROCHLOROTHIAZIDE 12.5 MG: 12.5 | 30 days supply | Qty: 30 | Fill #2

## 2018-07-31 MED FILL — metFORMIN HCL 500 MG TABS: 500 | 90 days supply | Qty: 360 | Fill #0

## 2018-08-19 MED FILL — HYDROCHLOROTHIAZIDE 12.5 MG: 12.5 | 30 days supply | Qty: 30 | Fill #0

## 2018-08-29 MED FILL — SERTRALINE HCL 100 MG TAB: 100 | 90 days supply | Qty: 90 | Fill #0

## 2018-08-29 MED FILL — LOSARTAN-HCTZ 50-12.5 MG TA: 50-12.5 | 30 days supply | Qty: 30 | Fill #0

## 2018-08-29 MED FILL — SUMAtriptan SUCCINATE 100 M: 100 | 90 days supply | Qty: 27 | Fill #0

## 2018-09-29 MED FILL — LOSARTAN-HCTZ 50-12.5 MG TA: 50-12.5 | 30 days supply | Qty: 30 | Fill #1

## 2018-10-18 DIAGNOSIS — Z13220 Encounter for screening for lipoid disorders: Secondary | ICD-10-CM | POA: Diagnosis not present

## 2018-10-18 DIAGNOSIS — E119 Type 2 diabetes mellitus without complications: Secondary | ICD-10-CM | POA: Diagnosis not present

## 2018-10-18 DIAGNOSIS — D1803 Hemangioma of intra-abdominal structures: Secondary | ICD-10-CM | POA: Diagnosis not present

## 2018-10-18 DIAGNOSIS — I10 Essential (primary) hypertension: Secondary | ICD-10-CM | POA: Diagnosis not present

## 2018-10-18 DIAGNOSIS — Z0000 Encounter for general adult medical examination without abnormal findings: Secondary | ICD-10-CM | POA: Diagnosis not present

## 2018-10-18 DIAGNOSIS — L4050 Arthropathic psoriasis, unspecified: Secondary | ICD-10-CM | POA: Diagnosis not present

## 2018-10-18 DIAGNOSIS — F418 Other specified anxiety disorders: Secondary | ICD-10-CM | POA: Diagnosis not present

## 2018-10-18 DIAGNOSIS — Z1231 Encounter for screening mammogram for malignant neoplasm of breast: Secondary | ICD-10-CM | POA: Diagnosis not present

## 2018-10-23 ENCOUNTER — Other Ambulatory Visit: Payer: Self-pay | Admitting: Family Medicine

## 2018-10-23 DIAGNOSIS — Z1231 Encounter for screening mammogram for malignant neoplasm of breast: Secondary | ICD-10-CM

## 2018-10-23 DIAGNOSIS — D1803 Hemangioma of intra-abdominal structures: Secondary | ICD-10-CM

## 2018-10-27 MED FILL — LOSARTAN-HCTZ 50-12.5 MG TA: 50-12.5 | 30 days supply | Qty: 30 | Fill #2

## 2018-11-08 DIAGNOSIS — J069 Acute upper respiratory infection, unspecified: Secondary | ICD-10-CM | POA: Diagnosis not present

## 2018-11-14 DIAGNOSIS — J40 Bronchitis, not specified as acute or chronic: Secondary | ICD-10-CM | POA: Diagnosis not present

## 2018-11-14 MED FILL — AZITHROMYCIN 250 MG TABLET: 250 | 5 days supply | Qty: 6 | Fill #0

## 2018-11-29 DIAGNOSIS — E1169 Type 2 diabetes mellitus with other specified complication: Secondary | ICD-10-CM | POA: Diagnosis not present

## 2018-11-29 DIAGNOSIS — R05 Cough: Secondary | ICD-10-CM | POA: Diagnosis not present

## 2018-11-29 DIAGNOSIS — I10 Essential (primary) hypertension: Secondary | ICD-10-CM | POA: Diagnosis not present

## 2018-11-30 DIAGNOSIS — R05 Cough: Secondary | ICD-10-CM | POA: Diagnosis not present

## 2018-12-01 MED FILL — SERTRALINE HCL 100 MG TAB: 100 | 90 days supply | Qty: 90 | Fill #1

## 2018-12-01 MED FILL — LOSARTAN-HCTZ 50-12.5 MG TA: 50-12.5 | 30 days supply | Qty: 30 | Fill #3

## 2018-12-06 MED FILL — metFORMIN HCL 1000 MG TABS: 1000 | 90 days supply | Qty: 180 | Fill #0

## 2018-12-06 MED FILL — SAXENDA 18 MG/3 ML PEN: 18 | 75 days supply | Qty: 15 | Fill #0

## 2018-12-07 MED FILL — UNIFINE PENTIPS 32GX5/32": 32G X 4 MM | 90 days supply | Qty: 100 | Fill #0

## 2018-12-07 MED FILL — UNIFINE PENTIPS 32GX5/32: 32G X 4 MM | 90 days supply | Qty: 100 | Fill #0

## 2018-12-15 MED FILL — SUMAtriptan SUCCINATE 100 M: 100 | 90 days supply | Qty: 27 | Fill #1

## 2018-12-28 MED FILL — LOSARTAN-HCTZ 50-12.5 MG TA: 50-12.5 | 30 days supply | Qty: 30 | Fill #4

## 2019-01-02 MED FILL — SUMAtriptan SUCCINATE 100 M: 100 | 90 days supply | Qty: 27 | Fill #1

## 2019-02-02 MED FILL — LOSARTAN-HCTZ 50-12.5 MG TA: 50-12.5 | 30 days supply | Qty: 30 | Fill #5

## 2019-02-27 MED FILL — SERTRALINE HCL 100 MG TAB: 100 | 90 days supply | Qty: 90 | Fill #2

## 2019-03-05 MED FILL — LOSARTAN-HCTZ 50-12.5 MG TA: 50-12.5 | 30 days supply | Qty: 30 | Fill #6

## 2019-03-13 MED FILL — UNIFINE PENTIPS 32GX5/32: 32G X 4 MM | 90 days supply | Qty: 100 | Fill #1

## 2019-03-13 MED FILL — SAXENDA 18 MG/3 ML PEN: 18 | 75 days supply | Qty: 15 | Fill #1

## 2019-03-28 DIAGNOSIS — E1169 Type 2 diabetes mellitus with other specified complication: Secondary | ICD-10-CM | POA: Diagnosis not present

## 2019-03-28 DIAGNOSIS — K760 Fatty (change of) liver, not elsewhere classified: Secondary | ICD-10-CM | POA: Diagnosis not present

## 2019-04-02 IMAGING — CT CT ABD-PELV W/ CM
2 of 5 series · 15 of 46 positions shown, 17 images · IV contrast (APPLIED)
Comparison: Abdominal ultrasound June 13, 2017

CLINICAL DATA: Elevated liver enzymes.  History of splenomegaly

EXAM:
CT ABDOMEN AND PELVIS WITH CONTRAST
TECHNIQUE: Multidetector CT imaging of the abdomen and pelvis was performed
using the standard protocol following bolus administration of
intravenous contrast.
CONTRAST:  100mL OMNIPAQUE IOHEXOL 300 MG/ML  SOLN

[Series 3: abdomen 5.0 · axial · 0.90mm/px · z∈[+839,+1239]mm · 12 of 94 slices shown, 14 images]
[im 7/94  soft-tissue]
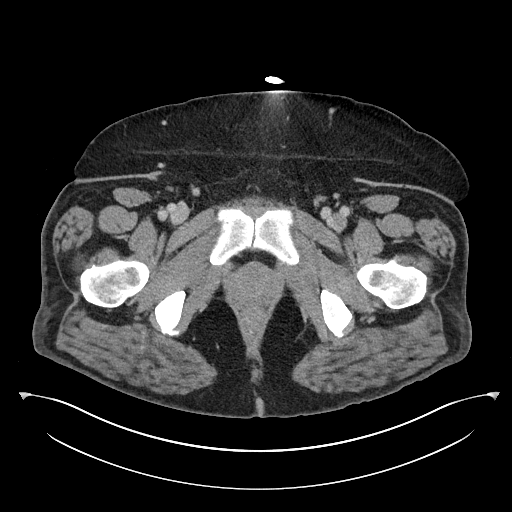
[im 7/94  bone]
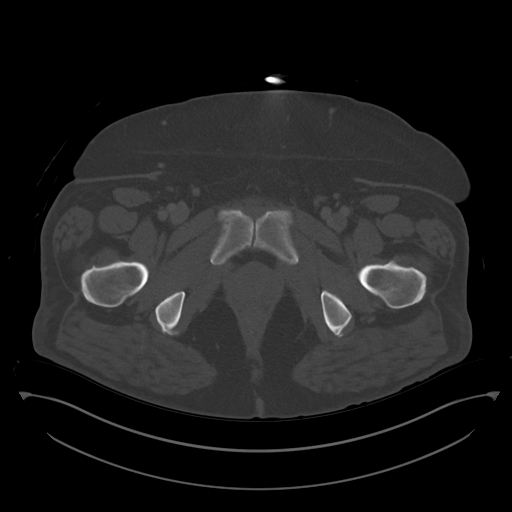
[im 13/94  soft-tissue]
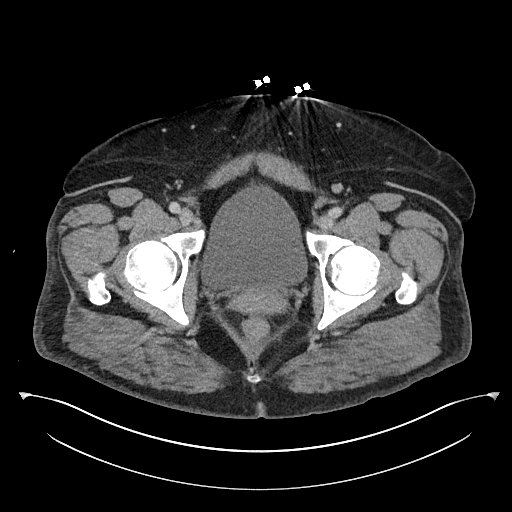
[im 19/94  soft-tissue]
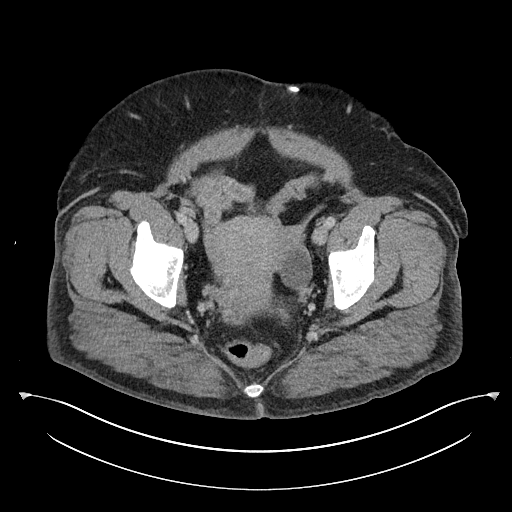
[im 32/94  soft-tissue]
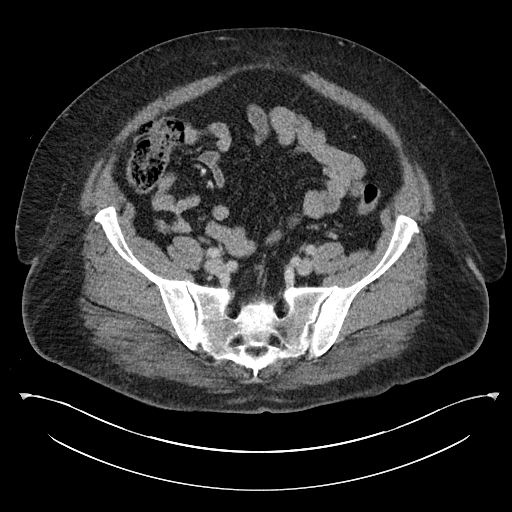
[im 38/94  soft-tissue]
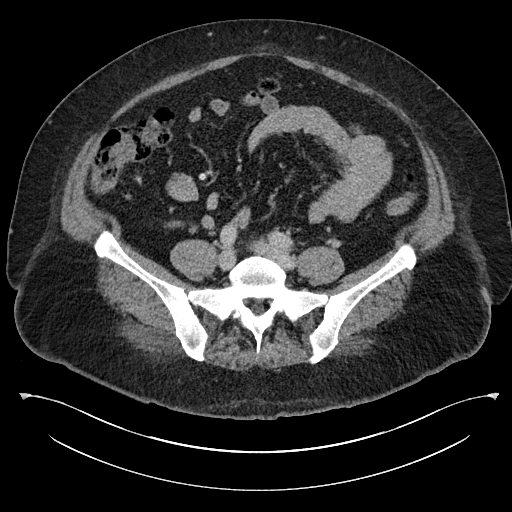
[im 44/94  soft-tissue]
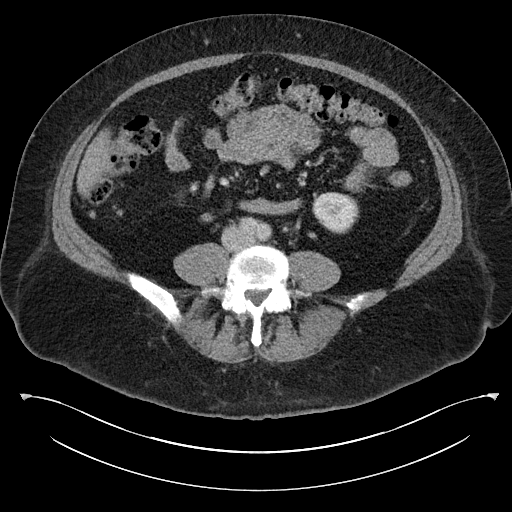
[im 50/94  soft-tissue]
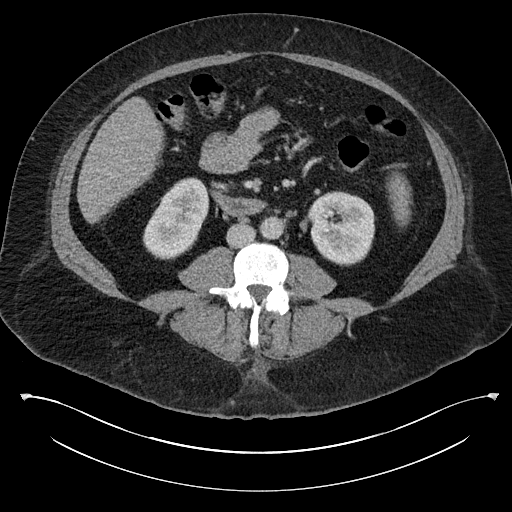
[im 56/94  soft-tissue]
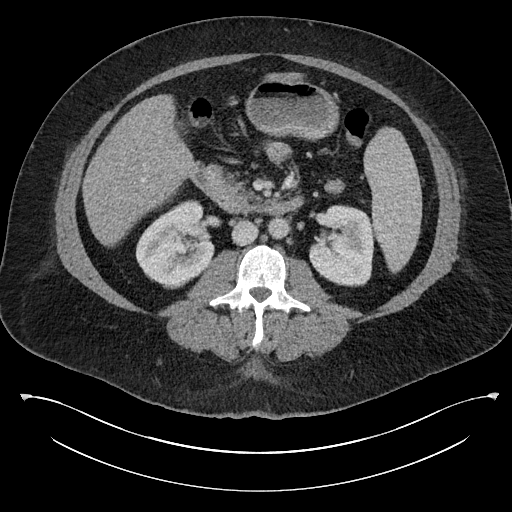
[im 63/94  soft-tissue]
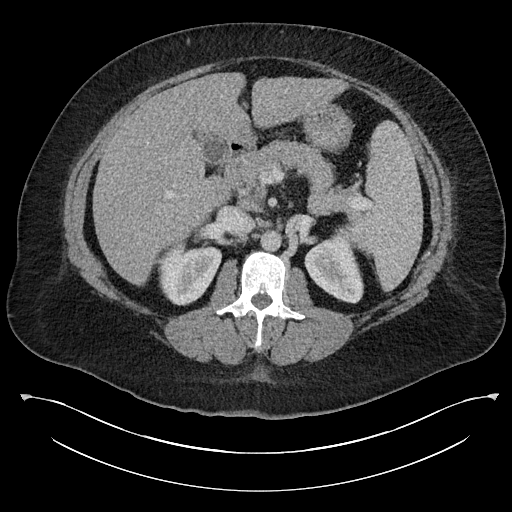
[im 63/94  bone]
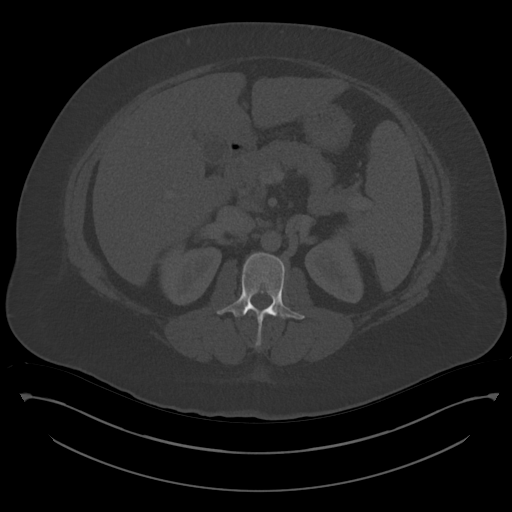
[im 75/94  soft-tissue]
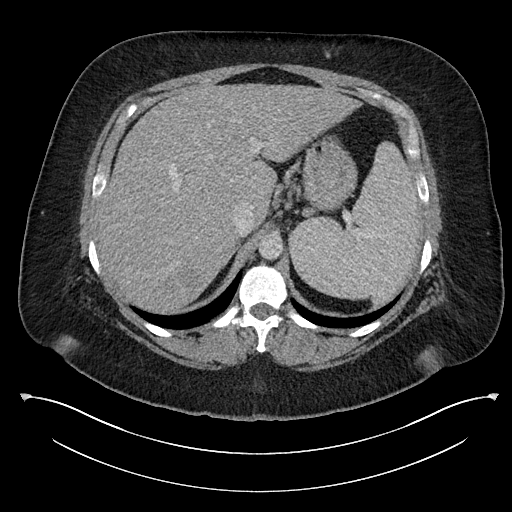
[im 81/94  soft-tissue]
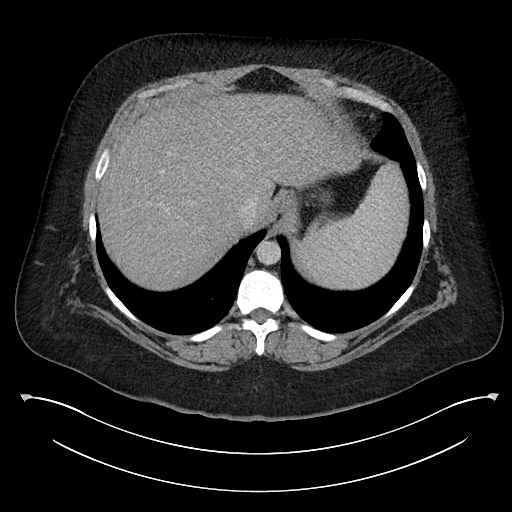
[im 87/94  soft-tissue]
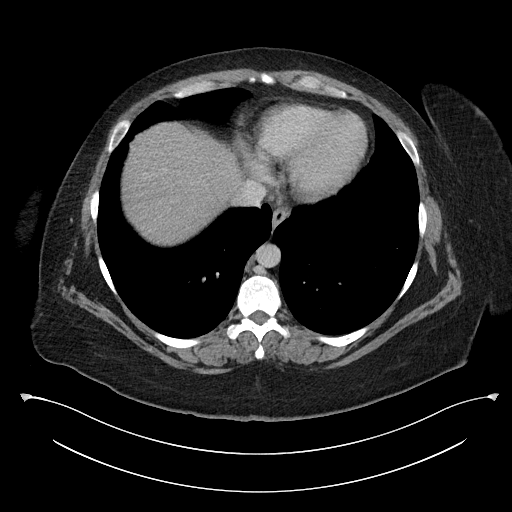

[Series 6: abdomen 3.0 mpr cor · coronal · 0.92mm/px · 3 of 128 slices shown]
[im 43/128  soft-tissue]
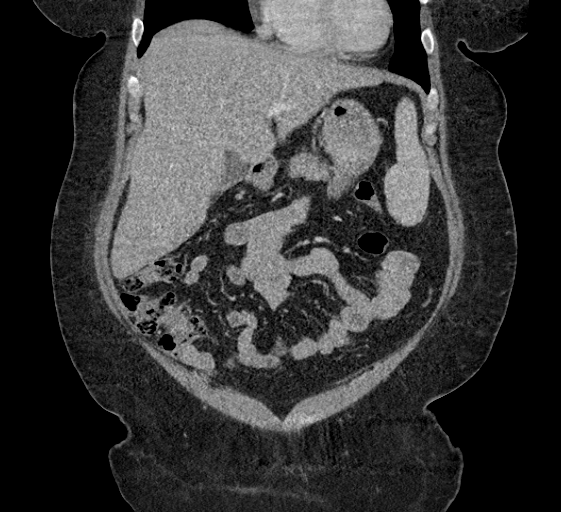
[im 57/128  soft-tissue]
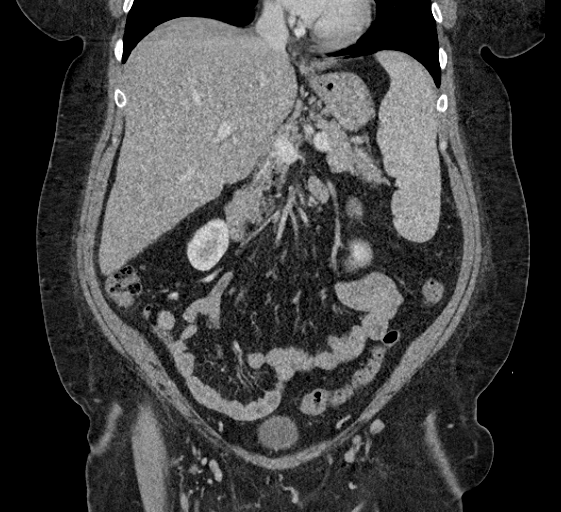
[im 71/128  soft-tissue]
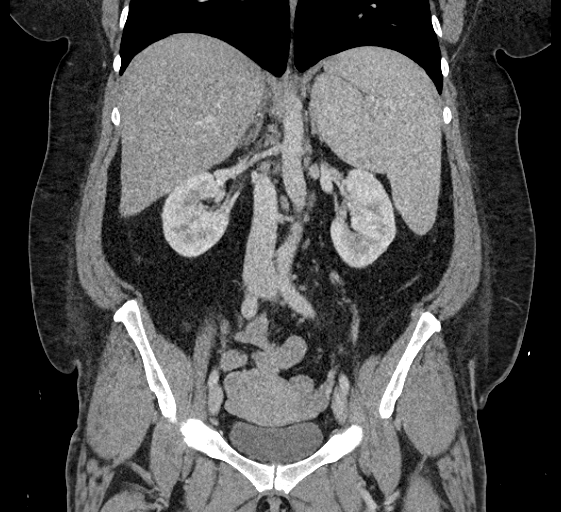

[15 of 46 positions shown; findings below may reference images not displayed]

FINDINGS: Lower chest: Lung bases are clear.

Hepatobiliary: There is hepatic steatosis. Liver measures 25.9 cm in
length. Liver has an equivocal nodular contour along portions,
primarily on the left. There is a decreased attenuation mass arising
from the posterior aspect of the right lobe of the liver measuring
5.7 x 4.4 x 4.4 cm. No other focal liver lesions are appreciable.

Gallbladder wall is not appreciably thickened. No biliary duct
dilatation evident.

Pancreas: No pancreatic mass or inflammatory focus.

Spleen: Spleen measures 18.0 x 14.9 x 11.2 cm with a measured
splenic volume of 1,502 cubic cm. No focal splenic lesions are
evident.

Adrenals/Urinary Tract: Adrenals appear unremarkable. Kidneys
bilaterally show no evident mass or hydronephrosis on either side.
There is no evident renal or ureteral calculus on either side.
Urinary bladder is midline with wall thickness within normal limits.

Stomach/Bowel: There is no appreciable bowel wall or mesenteric
thickening. No evident bowel obstruction. No free air or portal
venous air.

Vascular/Lymphatic: Aorta is mildly tortuous without aneurysm. No
vascular lesions are evident. There is no appreciable adenopathy in
the abdomen or pelvis.

Reproductive: Uterus is anteverted. There is a cystic area arising
from the left ovary measuring 3.2 x 2.9 x 3.0 cm, a likely simple
cyst versus dominant follicle. No other pelvic mass evident.

Other: Appendix appears normal. No abscess or ascites evident in the
abdomen or pelvis.

Musculoskeletal: Degenerative changes noted in the lower lumbar
spine. There are no blastic or lytic bone lesions. No intramuscular
or abdominal wall lesions are appreciable.
IMPRESSION: 1. There is a solid mass arising from the posterior segment of the
right lobe of the liver measuring 5.7 x 4.4 x 4.4 cm. This mass does
not have an appearance that is typical for hemangioma. Its etiology
is uncertain. Given this finding, it may be prudent to consider pre
and serial post-contrast MR to further evaluate.

2. Hepatomegaly with hepatic steatosis. Subtle nodular contour along
portions of the liver raise question of underlying hepatic
cirrhosis.

3.  Splenomegaly.  No focal splenic lesions evident.

4. No evident bowel obstruction. No abscess in the abdomen pelvis.
Appendix appears normal.

5.  No evident renal or ureteral calculus.  No hydronephrosis.

These results will be called to the ordering clinician or
representative by the Radiologist Assistant, and communication
documented in the PACS or zVision Dashboard.

## 2019-04-04 MED FILL — LOSARTAN-HCTZ 50-12.5 MG TA: 50-12.5 | 30 days supply | Qty: 30 | Fill #7

## 2019-04-04 MED FILL — metFORMIN HCL 1000 MG TABS: 1000 | 90 days supply | Qty: 180 | Fill #1

## 2019-04-13 MED FILL — SUMAtriptan SUCCINATE 100 M: 100 | 90 days supply | Qty: 27 | Fill #0

## 2019-04-26 MED FILL — SAXENDA 18 MG/3 ML PEN: 18 | 30 days supply | Qty: 15 | Fill #0

## 2019-05-11 MED FILL — LOSARTAN-HCTZ 50-12.5 MG TA: 50-12.5 | 30 days supply | Qty: 30 | Fill #8

## 2019-06-04 MED FILL — SERTRALINE HCL 100 MG TAB: 100 | 90 days supply | Qty: 90 | Fill #3

## 2019-06-11 MED FILL — LOSARTAN-HCTZ 50-12.5 MG TA: 50-12.5 | 30 days supply | Qty: 30 | Fill #9

## 2019-06-25 MED FILL — SAXENDA 18 MG/3 ML PEN: 18 | 30 days supply | Qty: 15 | Fill #1

## 2019-07-04 DIAGNOSIS — Z029 Encounter for administrative examinations, unspecified: Secondary | ICD-10-CM | POA: Diagnosis not present

## 2019-07-04 DIAGNOSIS — G44009 Cluster headache syndrome, unspecified, not intractable: Secondary | ICD-10-CM | POA: Diagnosis not present

## 2019-07-04 DIAGNOSIS — M5416 Radiculopathy, lumbar region: Secondary | ICD-10-CM | POA: Diagnosis not present

## 2019-07-05 DIAGNOSIS — L409 Psoriasis, unspecified: Secondary | ICD-10-CM | POA: Diagnosis not present

## 2019-07-05 DIAGNOSIS — M2550 Pain in unspecified joint: Secondary | ICD-10-CM | POA: Diagnosis not present

## 2019-07-05 DIAGNOSIS — R5383 Other fatigue: Secondary | ICD-10-CM | POA: Diagnosis not present

## 2019-07-05 DIAGNOSIS — E669 Obesity, unspecified: Secondary | ICD-10-CM | POA: Diagnosis not present

## 2019-07-05 DIAGNOSIS — E119 Type 2 diabetes mellitus without complications: Secondary | ICD-10-CM | POA: Diagnosis not present

## 2019-07-05 DIAGNOSIS — L4050 Arthropathic psoriasis, unspecified: Secondary | ICD-10-CM | POA: Diagnosis not present

## 2019-07-05 DIAGNOSIS — Z6838 Body mass index (BMI) 38.0-38.9, adult: Secondary | ICD-10-CM | POA: Diagnosis not present

## 2019-07-05 DIAGNOSIS — Z9119 Patient's noncompliance with other medical treatment and regimen: Secondary | ICD-10-CM | POA: Diagnosis not present

## 2019-07-09 MED FILL — LOSARTAN-HCTZ 50-12.5 MG TA: 50-12.5 | 30 days supply | Qty: 30 | Fill #10

## 2019-08-15 MED FILL — LOSARTAN-HCTZ 50-12.5 MG TA: 50-12.5 | 30 days supply | Qty: 30 | Fill #11

## 2019-09-10 MED FILL — SAXENDA 18 MG/3 ML PEN: 18 | 30 days supply | Qty: 15 | Fill #2

## 2019-09-10 MED FILL — metFORMIN HCL 1000 MG TABS: 1000 | 90 days supply | Qty: 180 | Fill #2

## 2019-09-11 ENCOUNTER — Other Ambulatory Visit (HOSPITAL_COMMUNITY): Payer: Self-pay | Admitting: Family Medicine

## 2019-09-11 MED FILL — SUMAtriptan SUCCINATE 100 M: 100 | 90 days supply | Qty: 27 | Fill #1

## 2019-09-11 MED FILL — SERTRALINE HCL 100 MG TAB: 100 | 90 days supply | Qty: 90 | Fill #0

## 2019-09-11 MED FILL — LOSARTAN-HCTZ 50-12.5 MG TA: 50-12.5 | 90 days supply | Qty: 90 | Fill #0

## 2019-10-04 DIAGNOSIS — Z9119 Patient's noncompliance with other medical treatment and regimen: Secondary | ICD-10-CM | POA: Diagnosis not present

## 2019-10-04 DIAGNOSIS — G5601 Carpal tunnel syndrome, right upper limb: Secondary | ICD-10-CM | POA: Diagnosis not present

## 2019-10-04 DIAGNOSIS — M2550 Pain in unspecified joint: Secondary | ICD-10-CM | POA: Diagnosis not present

## 2019-10-04 DIAGNOSIS — L409 Psoriasis, unspecified: Secondary | ICD-10-CM | POA: Diagnosis not present

## 2019-10-04 DIAGNOSIS — E669 Obesity, unspecified: Secondary | ICD-10-CM | POA: Diagnosis not present

## 2019-10-04 DIAGNOSIS — Z6838 Body mass index (BMI) 38.0-38.9, adult: Secondary | ICD-10-CM | POA: Diagnosis not present

## 2019-10-04 DIAGNOSIS — E119 Type 2 diabetes mellitus without complications: Secondary | ICD-10-CM | POA: Diagnosis not present

## 2019-10-04 DIAGNOSIS — L4050 Arthropathic psoriasis, unspecified: Secondary | ICD-10-CM | POA: Diagnosis not present

## 2019-10-04 DIAGNOSIS — R5383 Other fatigue: Secondary | ICD-10-CM | POA: Diagnosis not present

## 2019-10-04 MED FILL — NAPROXEN 500 MG TABLET: 500 | 30 days supply | Qty: 60 | Fill #0

## 2019-11-02 MED FILL — SAXENDA 18 MG/3 ML PEN: 18 | 30 days supply | Qty: 15 | Fill #3

## 2019-12-20 ENCOUNTER — Other Ambulatory Visit (HOSPITAL_COMMUNITY): Payer: Self-pay | Admitting: Family Medicine

## 2019-12-20 MED FILL — SERTRALINE HCL 100 MG TAB: 100 | 90 days supply | Qty: 90 | Fill #1

## 2019-12-20 MED FILL — METFORMIN HCL 1000 MG TABS: 1000 | 90 days supply | Qty: 180 | Fill #0

## 2019-12-20 MED FILL — UNIFINE PENTIPS 32GX5/32: 32G X 4 MM | 90 days supply | Qty: 100 | Fill #0

## 2019-12-20 MED FILL — LOSARTAN-HCTZ 50-12.5 MG TA: 50-12.5 | 90 days supply | Qty: 90 | Fill #1

## 2020-01-03 ENCOUNTER — Other Ambulatory Visit (HOSPITAL_COMMUNITY): Payer: Self-pay | Admitting: Family Medicine

## 2020-01-03 MED FILL — SAXENDA 18 MG/3 ML PEN: 18 | 30 days supply | Qty: 15 | Fill #4

## 2020-01-03 MED FILL — SUMAtriptan SUCCINATE 100 M: 100 | 90 days supply | Qty: 27 | Fill #0

## 2020-01-29 ENCOUNTER — Other Ambulatory Visit (HOSPITAL_COMMUNITY): Payer: Self-pay | Admitting: Family Medicine

## 2020-01-29 DIAGNOSIS — Z7984 Long term (current) use of oral hypoglycemic drugs: Secondary | ICD-10-CM | POA: Diagnosis not present

## 2020-01-29 DIAGNOSIS — E1169 Type 2 diabetes mellitus with other specified complication: Secondary | ICD-10-CM | POA: Diagnosis not present

## 2020-01-29 DIAGNOSIS — G8929 Other chronic pain: Secondary | ICD-10-CM | POA: Diagnosis not present

## 2020-01-29 DIAGNOSIS — M5441 Lumbago with sciatica, right side: Secondary | ICD-10-CM | POA: Diagnosis not present

## 2020-01-29 DIAGNOSIS — E113293 Type 2 diabetes mellitus with mild nonproliferative diabetic retinopathy without macular edema, bilateral: Secondary | ICD-10-CM | POA: Diagnosis not present

## 2020-01-29 DIAGNOSIS — I10 Essential (primary) hypertension: Secondary | ICD-10-CM | POA: Diagnosis not present

## 2020-01-29 MED FILL — CYCLOBENZAPRINE HCL 10 MG T: 10 | 10 days supply | Qty: 30 | Fill #0

## 2020-01-30 ENCOUNTER — Other Ambulatory Visit (HOSPITAL_COMMUNITY): Payer: Self-pay | Admitting: Family Medicine

## 2020-01-30 MED FILL — GLIMEPIRIDE 1 MG TABLET: 1 | 30 days supply | Qty: 30 | Fill #0

## 2020-01-30 MED FILL — FREESTYLE LITE TEST STRIP: 25 days supply | Qty: 100 | Fill #0

## 2020-02-08 MED FILL — SAXENDA 18 MG/3 ML PEN: 18 | 30 days supply | Qty: 15 | Fill #5

## 2020-02-22 MED FILL — SAXENDA 18 MG/3 ML PEN: 18 | 30 days supply | Qty: 15 | Fill #5

## 2020-02-28 ENCOUNTER — Other Ambulatory Visit (HOSPITAL_COMMUNITY): Payer: Self-pay | Admitting: Family Medicine

## 2020-03-03 MED FILL — UNIFINE PENTIPS 32GX5/32: 32G X 4 MM | 90 days supply | Qty: 100 | Fill #0

## 2020-03-21 MED FILL — LOSARTAN-HCTZ 50-12.5 MG TA: 50-12.5 | 30 days supply | Qty: 30 | Fill #2

## 2020-03-21 MED FILL — SERTRALINE HCL 100 MG TAB: 100 | 90 days supply | Qty: 90 | Fill #2

## 2020-04-04 MED FILL — GLIMEPIRIDE 1 MG TABLET: 1 | 30 days supply | Qty: 30 | Fill #1

## 2020-04-11 MED FILL — CYCLOBENZAPRINE HCL 10 MG T: 10 | 10 days supply | Qty: 30 | Fill #1

## 2020-04-22 MED FILL — LOSARTAN-HCTZ 50-12.5 MG TA: 50-12.5 | 30 days supply | Qty: 30 | Fill #3

## 2020-05-08 ENCOUNTER — Telehealth: Payer: Self-pay | Admitting: Pharmacist

## 2020-05-08 DIAGNOSIS — E119 Type 2 diabetes mellitus without complications: Secondary | ICD-10-CM | POA: Diagnosis not present

## 2020-05-08 DIAGNOSIS — M2550 Pain in unspecified joint: Secondary | ICD-10-CM | POA: Diagnosis not present

## 2020-05-08 DIAGNOSIS — Z6839 Body mass index (BMI) 39.0-39.9, adult: Secondary | ICD-10-CM | POA: Diagnosis not present

## 2020-05-08 DIAGNOSIS — L4050 Arthropathic psoriasis, unspecified: Secondary | ICD-10-CM | POA: Diagnosis not present

## 2020-05-08 DIAGNOSIS — Z9119 Patient's noncompliance with other medical treatment and regimen: Secondary | ICD-10-CM | POA: Diagnosis not present

## 2020-05-08 DIAGNOSIS — G5601 Carpal tunnel syndrome, right upper limb: Secondary | ICD-10-CM | POA: Diagnosis not present

## 2020-05-08 DIAGNOSIS — M25431 Effusion, right wrist: Secondary | ICD-10-CM | POA: Diagnosis not present

## 2020-05-08 DIAGNOSIS — R5383 Other fatigue: Secondary | ICD-10-CM | POA: Diagnosis not present

## 2020-05-08 DIAGNOSIS — L409 Psoriasis, unspecified: Secondary | ICD-10-CM | POA: Diagnosis not present

## 2020-05-08 NOTE — Telephone Encounter (Signed)
Called patient to schedule an appointment for the [place] [facility]. I was unable to reach the patient so I left a HIPAA-compliant message requesting that the patient return my call.   Luke Van Ausdall, PharmD, BCACP, CPP Clinical Pharmacist [facility] [phone]  

## 2020-05-19 ENCOUNTER — Other Ambulatory Visit: Payer: Self-pay

## 2020-05-19 ENCOUNTER — Other Ambulatory Visit: Payer: Self-pay | Admitting: Internal Medicine

## 2020-05-19 ENCOUNTER — Ambulatory Visit: Payer: 59 | Attending: Family Medicine | Admitting: Pharmacist

## 2020-05-19 DIAGNOSIS — Z79899 Other long term (current) drug therapy: Secondary | ICD-10-CM

## 2020-05-19 MED ORDER — TALTZ 80 MG/ML ~~LOC~~ SOSY: PREFILLED_SYRINGE | SUBCUTANEOUS | 0 refills | Status: DC

## 2020-05-19 NOTE — Progress Notes (Signed)
   S: Patient presents to [facility] [place] for review of their specialty medication therapy.  Patient is getting ready to restart Taltz for psoriatic arthritis. Patient is managed by Marella Chimes for this.   Adherence: denies any issues - has taken in the past  Efficacy: reports that it worked well for her in the past. Did not have any adverse effects while on it.   Dosing:  Psoriatic arthritis: SubQ: 160 mg once, followed by 80 mg every 4 weeks; may administer alone or in combination with conventional disease-modifying antirheumatic drugs (eg, methotrexate).   Dose adjustments: Renal: no dose adjustments (has not been studied) Hepatic: no dose adjustments (has not been studied)  Screening: TB test: completed per patient  Monitoring: S/sx of infection: denies CBC: monitored by [facility] rheumatology S/sx of hypersensitivity: denies   O:     No results found for: WBC, HGB, HCT, MCV, PLT    Chemistry      Component Value Date/Time   CREATININE 0.50 01/03/2018 1656   No results found for: CALCIUM, ALKPHOS, AST, ALT, BILITOT     A/P: 1. Medication review: Patient currently prescribed Taltz for psoriatic arthritis. Reviewed the medication with the patient, including the following: Donnetta Hail is a monoclonal antibody used in the treatment of ankylosing spondylitis, plaque psoriasis, and psoriatic arthritis. Administration instructions were provided: Subcutaneous: Allow to reach room temperature prior to injection (30 minutes). Do not shake. Inject full amount into the upper arms, thighs or any quadrant of the abdomen; administer each injection at a different anatomic location than a previous injection and avoid areas where the skin is tender, bruised, erythematous, indurated, or affected by psoriasis. Administration in the upper, outer arm may be performed by a caregiver or health care provider. Ixekizumab is intended for use under the guidance and supervision of a physician; may be  self-injected by the patient following proper training in SubQ injection technique. Possible adverse effects include neutropenia, increased risk of infection, local injection site reaction, and antibody development (loss of efficacy of drug). May increase risk of Crohn disease or ulcerative colitis. Live vaccinations should be avoided. No recommendations for any changes.   Benard Halsted, PharmD, [place], [place] [phone]

## 2020-05-22 MED FILL — GLIMEPIRIDE 1 MG TABLET: 1 | 30 days supply | Qty: 30 | Fill #2

## 2020-05-22 MED FILL — METFORMIN HCL 1000 MG TABS: 1000 | 90 days supply | Qty: 180 | Fill #1

## 2020-05-22 MED FILL — LOSARTAN-HCTZ 50-12.5 MG TA: 50-12.5 | 30 days supply | Qty: 30 | Fill #4

## 2020-05-29 ENCOUNTER — Other Ambulatory Visit (HOSPITAL_COMMUNITY): Payer: Self-pay

## 2020-06-03 ENCOUNTER — Other Ambulatory Visit (HOSPITAL_COMMUNITY): Payer: Self-pay

## 2020-06-03 MED FILL — Ixekizumab Subcutaneous Soln Prefilled Syringe 80 MG/ML: SUBCUTANEOUS | 28 days supply | Qty: 4 | Fill #0 | Status: CN

## 2020-06-22 ENCOUNTER — Emergency Department
Admission: RE | Admit: 2020-06-22 | Discharge: 2020-06-22 | Disposition: A | Discharge status: Home / Self Care | Payer: 59 | Source: Ambulatory Visit

## 2020-06-22 ENCOUNTER — Other Ambulatory Visit: Payer: Self-pay

## 2020-06-22 VITALS — BP 129/86 | HR 89 | Temp 98.4°F

## 2020-06-22 DIAGNOSIS — J069 Acute upper respiratory infection, unspecified: Secondary | ICD-10-CM | POA: Diagnosis not present

## 2020-06-22 MED ORDER — AMOXICILLIN 875 MG PO TABS: 875.0000 mg | ORAL_TABLET | Freq: Two times a day (BID) | ORAL | 0 refills | Status: AC

## 2020-06-22 NOTE — Discharge Instructions (Addendum)
Sent in amoxicillin for you to take twice a day for 7 days.  May continue with over-the-counter cough medication  Follow up with this office or with primary care if symptoms are persisting.  Follow up in the ER for high fever, trouble swallowing, trouble breathing, other concerning symptoms.

## 2020-06-22 NOTE — ED Triage Notes (Signed)
Patient c/o productive cough, congestion, runny nose, afebrile x 10 days.  Patient has been taking Mucinex, Alka Seltzer.  Patient is vaccinated.

## 2020-06-22 NOTE — ED Provider Notes (Signed)
[place]   [id number] 06/22/20 Arrival Time: 1056   CC: COVID symptoms  SUBJECTIVE: History from: patient.  Kirsten Rodriguez is a 45 y.o. female who presents with cough, fatigue, nasal congestion, headache for the last 10 days.  States that everyone in the household has the same illness.  Denies sick exposure to COVID, flu or strep. Denies recent travel. Has negative history of Covid. Has completed Covid vaccines and booster.  Has completed flu vaccine.  Has taken over-the-counter cough and cold with mild temporary relief.  Mom is a current employee, and has had negative COVID PCR within the last 7 days. There are no aggravating or alleviating factors. Denies previous symptoms in the past. Denies fever, chills, fatigue, sinus pain, rhinorrhea, sore throat, SOB, wheezing, chest pain, nausea, changes in bowel or bladder habits.    ROS: As per HPI.  All other pertinent ROS negative.     Past Medical History:  Diagnosis Date  . Diabetes mellitus without complication ([place])   . Hypertension   . Psoriatic arthritis [facility])    Past Surgical History:  Procedure Laterality Date  . BACK SURGERY     L-spine  . KNEE SURGERY Right    No Known Allergies No current facility-administered medications on file prior to encounter.   Current Outpatient Medications on File Prior to Encounter  Medication Sig Dispense Refill  . glimepiride (AMARYL) 1 MG tablet TAKE 1 TABLET BY MOUTH ONCE DAILY WITH BREAKFAST OR FIRST MAIN MEAL OF THE DAY. 30 tablet 3  . glucose blood test strip USE AS DIRECTED TO CHECK BLOOD GLUCOSE 3 TO 4 TIMES DAILY. (Patient taking differently: USE AS DIRECTED TO CHECK BLOOD GLUCOSE 3 TO 4 TIMES DAILY.) 100 strip 99  . Insulin Pen Needle 32G X 4 MM MISC USE AS DIRECTED 100 each 0  . Insulin Pen Needle 32G X 4 MM MISC USE AS DIRECTED 100 each 0  . Ixekizumab 80 MG/ML SOSY ON DAY 0, INJECT 2 SYRINGES UNDER THE SKIN, THEN INJECT 1 SYRINGE EVERY 2 WEEKS FOR 6 WEEKS. 8 mL 0  .  Liraglutide -Weight Management (SAXENDA) 18 MG/3ML SOPN See admin instructions.    Marland Kitchen losartan (COZAAR) 100 MG tablet 2 tablets    . losartan-hydrochlorothiazide (HYZAAR) 50-12.5 MG tablet Take 1 tablet by mouth daily.    Marland Kitchen losartan-hydrochlorothiazide (HYZAAR) 50-12.5 MG tablet TAKE 1 TABLET BY MOUTH ONCE DAILY. 90 tablet 3  . metFORMIN (GLUCOPHAGE) 1000 MG tablet TAKE 1 TABLET BY MOUTH 2 TIMES A DAY WITH A MEAL 180 tablet 3  . sertraline (ZOLOFT) 50 MG tablet Take 50 mg by mouth daily.    . SUMAtriptan (IMITREX) 100 MG tablet TAKE 1 TABLET BY MOUTH AT ONSET OF HEADACHE,MAY BE REPEATED IN 2 HOURS IF STILL HAVING MIGRAINE MAX DOSE=200 MG/DAY 27 tablet 1  . benzonatate (TESSALON) 100 MG capsule Take 1 capsule (100 mg total) by mouth 2 (two) times daily as needed for cough. 20 capsule 0  . cyclobenzaprine (FLEXERIL) 10 MG tablet TAKE 1 TABLET BY MOUTH UP TO 3 TIMES A DAY AS NEEDED FOR BACK PAIN FOR 10 DAYS 30 tablet 1  . fluticasone (FLONASE) 50 MCG/ACT nasal spray Place 2 sprays into both nostrils daily. 16 g 0  . metFORMIN (GLUCOPHAGE) 500 MG tablet Take by mouth 2 (two) times daily with a meal.    . sertraline (ZOLOFT) 100 MG tablet TAKE 1 TABLET BY MOUTH ONCE DAILY. NEEDS OFFICE VISIT FOR FURTHER REFILLS. 90 tablet 3  .  SUMAtriptan (IMITREX) 50 MG tablet Take 50 mg by mouth every 2 (two) hours as needed for migraine. May repeat in 2 hours if headache persists or recurs.     Social History   Socioeconomic History  . Marital status: Married    Spouse name: Not on file  . Number of children: Not on file  . Years of education: Not on file  . Highest education level: Not on file  Occupational History  . Not on file  Tobacco Use  . Smoking status: Former Research scientist (life sciences)  . Smokeless tobacco: Former Network engineer  . Vaping Use: Former  Substance and Sexual Activity  . Alcohol use: Yes    Comment: 5-6 per week   . Drug use: No  . Sexual activity: Not on file  Other Topics Concern  . Not on file   Social History Narrative  . Not on file   Social Determinants of Health   Financial Resource Strain: Not on file  Food Insecurity: Not on file  Transportation Needs: Not on file  Physical Activity: Not on file  Stress: Not on file  Social Connections: Not on file  Intimate Partner Violence: Not on file   No family history on file.  OBJECTIVE:  Vitals:   06/22/20 1125  BP: 129/86  Pulse: 89  Temp: 98.4 F (36.9 C)  TempSrc: Oral  SpO2: 96%     General appearance: alert; appears fatigued, but nontoxic; speaking in full sentences and tolerating own secretions HEENT: NCAT; Ears: EACs clear, TMs pearly gray; Eyes: PERRL.  EOM grossly intact. Sinuses: nontender; Nose: nares patent with clear rhinorrhea, Throat: oropharynx erythematous, cobblestoning present, tonsils non erythematous or enlarged, uvula midline  Neck: supple without LAD Lungs: unlabored respirations, symmetrical air entry; cough: mild; no respiratory distress; CTAB Heart: regular rate and rhythm.  Radial pulses 2+ symmetrical bilaterally Skin: warm and dry Psychological: alert and cooperative; normal mood and affect  LABS:  No results found for this or any previous visit (from the past 24 hour(s)).   ASSESSMENT & PLAN:  1. Upper respiratory tract infection, unspecified type     Meds ordered this encounter  Medications  . amoxicillin (AMOXIL) 875 MG tablet    Sig: Take 1 tablet (875 mg total) by mouth 2 (two) times daily for 7 days.    Dispense:  14 tablet    Refill:  0    Order Specific Question:   Supervising Provider    Answer:   Chase Picket [[id number]]    We will go ahead and cover for bacterial URI with amoxicillin 875 twice daily x7 days, given length of symptoms and negative COVID PCR within the last 7 days Continue supportive care at home Get plenty of rest and push fluids Use OTC zyrtec for nasal congestion, runny nose, and/or sore throat Use OTC flonase for nasal congestion and runny  nose Use medications daily for symptom relief Use OTC medications like ibuprofen or tylenol as needed fever or pain Call or go to the ED if you have any new or worsening symptoms such as fever, worsening cough, shortness of breath, chest tightness, chest pain, turning blue, changes in mental status.  Reviewed expectations re: course of current medical issues. Questions answered. Outlined signs and symptoms indicating need for more acute intervention. Patient verbalized understanding. After Visit Summary given.         Faustino Congress, NP 06/22/20 1253

## 2020-06-24 ENCOUNTER — Other Ambulatory Visit (HOSPITAL_COMMUNITY): Payer: Self-pay

## 2020-06-25 ENCOUNTER — Other Ambulatory Visit (HOSPITAL_COMMUNITY): Payer: Self-pay

## 2020-06-25 MED ORDER — LOSARTAN POTASSIUM-HCTZ 50-12.5 MG PO TABS
1.0000 | ORAL_TABLET | Freq: Every day | ORAL | 3 refills | Status: DC
  Filled 2020-06-25: qty 90, 90d supply, fill #0

## 2020-06-25 MED ORDER — SUMATRIPTAN SUCCINATE 100 MG PO TABS: ORAL_TABLET | ORAL | 1 refills | Status: AC

## 2020-06-25 MED ORDER — SERTRALINE HCL 100 MG PO TABS: 100.0000 mg | ORAL_TABLET | Freq: Every day | ORAL | 3 refills | Status: DC

## 2020-06-25 MED ORDER — INSULIN PEN NEEDLE 32G X 4 MM MISC
3 refills | Status: DC
  Filled 2020-06-25: qty 100, 90d supply, fill #0
  Filled 2021-02-10: qty 100, 90d supply, fill #1

## 2020-06-25 MED ORDER — SAXENDA 18 MG/3ML ~~LOC~~ SOPN
3.0000 mg | PEN_INJECTOR | Freq: Every day | SUBCUTANEOUS | 6 refills | Status: DC
  Filled 2020-06-25: qty 15, 30d supply, fill #0
  Filled 2021-02-10: qty 15, 30d supply, fill #1
  Filled 2021-04-09: qty 15, 30d supply, fill #0

## 2020-06-25 MED ORDER — GLIMEPIRIDE 1 MG PO TABS: 1.0000 mg | ORAL_TABLET | Freq: Every day | ORAL | 3 refills | Status: DC

## 2020-06-25 MED FILL — Losartan Potassium & Hydrochlorothiazide Tab 50-12.5 MG: ORAL | 30 days supply | Qty: 30 | Fill #0 | Status: AC

## 2020-06-25 MED FILL — Sertraline HCl Tab 100 MG: ORAL | 90 days supply | Qty: 90 | Fill #0 | Status: AC

## 2020-06-25 MED FILL — Sumatriptan Succinate Tab 100 MG: ORAL | 90 days supply | Qty: 27 | Fill #0 | Status: AC

## 2020-06-25 MED FILL — Glimepiride Tab 1 MG: ORAL | 30 days supply | Qty: 30 | Fill #0 | Status: AC

## 2020-07-28 MED FILL — Losartan Potassium & Hydrochlorothiazide Tab 50-12.5 MG: ORAL | 30 days supply | Qty: 30 | Fill #1 | Status: AC

## 2020-07-29 ENCOUNTER — Other Ambulatory Visit (HOSPITAL_COMMUNITY): Payer: Self-pay

## 2020-08-21 ENCOUNTER — Other Ambulatory Visit (HOSPITAL_COMMUNITY): Payer: Self-pay

## 2020-08-21 MED ORDER — GLIMEPIRIDE 1 MG PO TABS
1.0000 mg | ORAL_TABLET | Freq: Every morning | ORAL | 3 refills | Status: DC
  Filled 2020-08-21 – 2020-09-01 (×2): qty 30, 30d supply, fill #0
  Filled 2020-10-31: qty 30, 30d supply, fill #1
  Filled 2020-12-24: qty 30, 30d supply, fill #2

## 2020-08-29 ENCOUNTER — Other Ambulatory Visit (HOSPITAL_COMMUNITY): Payer: Self-pay

## 2020-09-01 ENCOUNTER — Other Ambulatory Visit (HOSPITAL_COMMUNITY): Payer: Self-pay

## 2020-09-01 MED FILL — Losartan Potassium & Hydrochlorothiazide Tab 50-12.5 MG: ORAL | 30 days supply | Qty: 30 | Fill #2 | Status: AC

## 2020-09-28 ENCOUNTER — Other Ambulatory Visit (HOSPITAL_COMMUNITY): Payer: Self-pay

## 2020-09-28 MED FILL — Metformin HCl Tab 1000 MG: ORAL | 90 days supply | Qty: 180 | Fill #0 | Status: AC

## 2020-09-29 ENCOUNTER — Other Ambulatory Visit (HOSPITAL_COMMUNITY): Payer: Self-pay

## 2020-09-29 MED ORDER — SERTRALINE HCL 100 MG PO TABS
100.0000 mg | ORAL_TABLET | Freq: Every day | ORAL | 0 refills | Status: DC
  Filled 2020-09-29: qty 90, 90d supply, fill #0

## 2020-09-29 MED ORDER — LOSARTAN POTASSIUM-HCTZ 50-12.5 MG PO TABS
1.0000 | ORAL_TABLET | Freq: Every day | ORAL | 0 refills | Status: DC
  Filled 2020-09-29: qty 90, 90d supply, fill #0

## 2020-10-31 ENCOUNTER — Other Ambulatory Visit (HOSPITAL_COMMUNITY): Payer: Self-pay

## 2020-11-11 ENCOUNTER — Other Ambulatory Visit (HOSPITAL_COMMUNITY): Payer: Self-pay

## 2020-11-11 MED ORDER — SUMATRIPTAN SUCCINATE 100 MG PO TABS
ORAL_TABLET | ORAL | 1 refills | Status: DC
  Filled 2020-11-11 – 2021-04-09 (×2): qty 27, 90d supply, fill #0

## 2020-11-13 DIAGNOSIS — U071 COVID-19: Secondary | ICD-10-CM | POA: Diagnosis not present

## 2020-11-17 ENCOUNTER — Other Ambulatory Visit (HOSPITAL_COMMUNITY): Payer: Self-pay

## 2020-11-17 MED ORDER — GUAIFENESIN-CODEINE 100-10 MG/5ML PO SOLN
5.0000 mL | ORAL | 0 refills | Status: DC | PRN
  Filled 2020-11-17: qty 240, 4d supply, fill #0

## 2020-11-17 MED ORDER — PREDNISONE 10 MG PO TABS
ORAL_TABLET | ORAL | 0 refills | Status: AC
  Filled 2020-11-17: qty 21, 6d supply, fill #0

## 2020-11-17 MED ORDER — AZITHROMYCIN 250 MG PO TABS
ORAL_TABLET | ORAL | 0 refills | Status: DC
  Filled 2020-11-17: qty 6, 5d supply, fill #0

## 2020-12-11 ENCOUNTER — Other Ambulatory Visit (HOSPITAL_COMMUNITY): Payer: Self-pay

## 2020-12-24 ENCOUNTER — Other Ambulatory Visit (HOSPITAL_COMMUNITY): Payer: Self-pay

## 2020-12-24 MED ORDER — LOSARTAN POTASSIUM-HCTZ 50-12.5 MG PO TABS
1.0000 | ORAL_TABLET | Freq: Every day | ORAL | 0 refills | Status: DC
  Filled 2020-12-24: qty 90, 90d supply, fill #0

## 2020-12-24 MED ORDER — SERTRALINE HCL 100 MG PO TABS
100.0000 mg | ORAL_TABLET | Freq: Every day | ORAL | 0 refills | Status: DC
  Filled 2020-12-24: qty 90, 90d supply, fill #0

## 2020-12-26 ENCOUNTER — Other Ambulatory Visit (HOSPITAL_COMMUNITY): Payer: Self-pay

## 2020-12-29 ENCOUNTER — Other Ambulatory Visit (HOSPITAL_COMMUNITY): Payer: Self-pay

## 2021-01-01 ENCOUNTER — Other Ambulatory Visit (HOSPITAL_COMMUNITY): Payer: Self-pay

## 2021-01-02 ENCOUNTER — Other Ambulatory Visit (HOSPITAL_COMMUNITY): Payer: Self-pay

## 2021-01-02 DIAGNOSIS — Z124 Encounter for screening for malignant neoplasm of cervix: Secondary | ICD-10-CM | POA: Diagnosis not present

## 2021-01-02 DIAGNOSIS — E1169 Type 2 diabetes mellitus with other specified complication: Secondary | ICD-10-CM | POA: Diagnosis not present

## 2021-01-02 DIAGNOSIS — Z0000 Encounter for general adult medical examination without abnormal findings: Secondary | ICD-10-CM | POA: Diagnosis not present

## 2021-01-02 DIAGNOSIS — L4050 Arthropathic psoriasis, unspecified: Secondary | ICD-10-CM | POA: Diagnosis not present

## 2021-01-02 DIAGNOSIS — Z13220 Encounter for screening for lipoid disorders: Secondary | ICD-10-CM | POA: Diagnosis not present

## 2021-01-02 DIAGNOSIS — Z7984 Long term (current) use of oral hypoglycemic drugs: Secondary | ICD-10-CM | POA: Diagnosis not present

## 2021-01-02 DIAGNOSIS — Z1239 Encounter for other screening for malignant neoplasm of breast: Secondary | ICD-10-CM | POA: Diagnosis not present

## 2021-01-02 DIAGNOSIS — F418 Other specified anxiety disorders: Secondary | ICD-10-CM | POA: Diagnosis not present

## 2021-01-02 DIAGNOSIS — I10 Essential (primary) hypertension: Secondary | ICD-10-CM | POA: Diagnosis not present

## 2021-01-02 DIAGNOSIS — G43109 Migraine with aura, not intractable, without status migrainosus: Secondary | ICD-10-CM | POA: Diagnosis not present

## 2021-01-02 MED ORDER — METFORMIN HCL 1000 MG PO TABS
1000.0000 mg | ORAL_TABLET | Freq: Two times a day (BID) | ORAL | 3 refills | Status: DC
  Filled 2021-01-02 – 2021-04-14 (×3): qty 180, 90d supply, fill #0
  Filled 2021-09-17: qty 180, 90d supply, fill #1

## 2021-01-12 ENCOUNTER — Other Ambulatory Visit: Payer: Self-pay | Admitting: Family Medicine

## 2021-01-12 DIAGNOSIS — Z1231 Encounter for screening mammogram for malignant neoplasm of breast: Secondary | ICD-10-CM

## 2021-01-13 ENCOUNTER — Other Ambulatory Visit (HOSPITAL_COMMUNITY): Payer: Self-pay

## 2021-01-14 ENCOUNTER — Other Ambulatory Visit (HOSPITAL_COMMUNITY): Payer: Self-pay

## 2021-02-10 ENCOUNTER — Other Ambulatory Visit (HOSPITAL_COMMUNITY): Payer: Self-pay

## 2021-02-27 ENCOUNTER — Other Ambulatory Visit (HOSPITAL_COMMUNITY): Payer: Self-pay

## 2021-03-31 ENCOUNTER — Other Ambulatory Visit (HOSPITAL_COMMUNITY): Payer: Self-pay

## 2021-04-01 ENCOUNTER — Other Ambulatory Visit (HOSPITAL_COMMUNITY): Payer: Self-pay

## 2021-04-01 MED ORDER — SERTRALINE HCL 100 MG PO TABS
100.0000 mg | ORAL_TABLET | Freq: Every day | ORAL | 0 refills | Status: DC
  Filled 2021-04-01: qty 90, 90d supply, fill #0

## 2021-04-01 MED ORDER — LOSARTAN POTASSIUM-HCTZ 50-12.5 MG PO TABS
ORAL_TABLET | ORAL | 0 refills | Status: DC
  Filled 2021-04-01: qty 90, 90d supply, fill #0

## 2021-04-10 ENCOUNTER — Other Ambulatory Visit (HOSPITAL_COMMUNITY): Payer: Self-pay

## 2021-04-14 ENCOUNTER — Other Ambulatory Visit (HOSPITAL_COMMUNITY): Payer: Self-pay

## 2021-04-16 ENCOUNTER — Other Ambulatory Visit (HOSPITAL_COMMUNITY): Payer: Self-pay

## 2021-04-22 ENCOUNTER — Other Ambulatory Visit (HOSPITAL_COMMUNITY): Payer: Self-pay

## 2021-04-22 DIAGNOSIS — E1169 Type 2 diabetes mellitus with other specified complication: Secondary | ICD-10-CM | POA: Diagnosis not present

## 2021-04-22 DIAGNOSIS — E113293 Type 2 diabetes mellitus with mild nonproliferative diabetic retinopathy without macular edema, bilateral: Secondary | ICD-10-CM | POA: Diagnosis not present

## 2021-04-22 DIAGNOSIS — I10 Essential (primary) hypertension: Secondary | ICD-10-CM | POA: Diagnosis not present

## 2021-04-22 DIAGNOSIS — Z7984 Long term (current) use of oral hypoglycemic drugs: Secondary | ICD-10-CM | POA: Diagnosis not present

## 2021-04-22 MED ORDER — OZEMPIC (2 MG/DOSE) 8 MG/3ML ~~LOC~~ SOPN
PEN_INJECTOR | SUBCUTANEOUS | 3 refills | Status: DC
  Filled 2021-04-22 (×2): qty 3, 28d supply, fill #0
  Filled 2021-05-06: qty 9, 90d supply, fill #0
  Filled 2021-10-23: qty 9, 84d supply, fill #1

## 2021-04-22 MED ORDER — ROSUVASTATIN CALCIUM 5 MG PO TABS
ORAL_TABLET | ORAL | 3 refills | Status: DC
  Filled 2021-04-22: qty 13, 30d supply, fill #0
  Filled 2021-09-29: qty 12, 28d supply, fill #0
  Filled 2021-11-25: qty 12, 28d supply, fill #1
  Filled 2022-01-11: qty 12, 28d supply, fill #2
  Filled 2022-02-19 (×3): qty 12, 28d supply, fill #3
  Filled 2022-03-31: qty 12, 28d supply, fill #4

## 2021-04-30 ENCOUNTER — Other Ambulatory Visit (HOSPITAL_COMMUNITY): Payer: Self-pay

## 2021-05-06 ENCOUNTER — Other Ambulatory Visit (HOSPITAL_COMMUNITY): Payer: Self-pay

## 2021-07-02 ENCOUNTER — Other Ambulatory Visit (HOSPITAL_COMMUNITY): Payer: Self-pay

## 2021-07-03 ENCOUNTER — Other Ambulatory Visit (HOSPITAL_COMMUNITY): Payer: Self-pay

## 2021-07-07 ENCOUNTER — Other Ambulatory Visit (HOSPITAL_COMMUNITY): Payer: Self-pay

## 2021-07-07 MED ORDER — SERTRALINE HCL 100 MG PO TABS
ORAL_TABLET | ORAL | 0 refills | Status: DC
  Filled 2021-07-07: qty 90, 90d supply, fill #0

## 2021-07-07 MED ORDER — LOSARTAN POTASSIUM-HCTZ 50-12.5 MG PO TABS
ORAL_TABLET | ORAL | 0 refills | Status: DC
  Filled 2021-07-07: qty 90, 90d supply, fill #0

## 2021-07-24 ENCOUNTER — Other Ambulatory Visit (HOSPITAL_COMMUNITY): Payer: Self-pay

## 2021-07-24 DIAGNOSIS — E785 Hyperlipidemia, unspecified: Secondary | ICD-10-CM | POA: Diagnosis not present

## 2021-07-24 DIAGNOSIS — E1169 Type 2 diabetes mellitus with other specified complication: Secondary | ICD-10-CM | POA: Diagnosis not present

## 2021-07-24 DIAGNOSIS — I10 Essential (primary) hypertension: Secondary | ICD-10-CM | POA: Diagnosis not present

## 2021-07-24 DIAGNOSIS — G43109 Migraine with aura, not intractable, without status migrainosus: Secondary | ICD-10-CM | POA: Diagnosis not present

## 2021-07-24 DIAGNOSIS — E118 Type 2 diabetes mellitus with unspecified complications: Secondary | ICD-10-CM | POA: Diagnosis not present

## 2021-07-24 MED ORDER — OZEMPIC (2 MG/DOSE) 8 MG/3ML ~~LOC~~ SOPN
PEN_INJECTOR | SUBCUTANEOUS | 3 refills | Status: DC
  Filled 2021-07-24: qty 9, 84d supply, fill #0
  Filled 2022-01-12: qty 3, 28d supply, fill #1
  Filled 2022-02-19 (×2): qty 3, 28d supply, fill #2
  Filled 2022-03-18: qty 3, 28d supply, fill #3
  Filled 2022-04-26: qty 3, 28d supply, fill #4
  Filled 2022-05-31: qty 3, 28d supply, fill #5
  Filled 2022-06-28 – 2022-07-08 (×2): qty 3, 28d supply, fill #6

## 2021-07-24 MED ORDER — JARDIANCE 10 MG PO TABS
10.0000 mg | ORAL_TABLET | Freq: Every morning | ORAL | 1 refills | Status: DC
  Filled 2021-07-24: qty 90, 90d supply, fill #0
  Filled 2021-11-25: qty 90, 90d supply, fill #1

## 2021-07-24 MED ORDER — FREESTYLE LITE TEST VI STRP
ORAL_STRIP | 3 refills | Status: DC
  Filled 2021-07-24: qty 100, 25d supply, fill #0
  Filled 2021-11-25: qty 100, 25d supply, fill #1

## 2021-07-24 MED ORDER — SUMATRIPTAN SUCCINATE 100 MG PO TABS
ORAL_TABLET | ORAL | 3 refills | Status: DC
  Filled 2021-07-24: qty 9, 30d supply, fill #0
  Filled 2021-09-29: qty 9, 30d supply, fill #1
  Filled 2021-11-25: qty 9, 30d supply, fill #2
  Filled 2022-01-11: qty 9, 30d supply, fill #3
  Filled 2022-02-19 (×2): qty 9, 30d supply, fill #4
  Filled 2022-03-31: qty 9, 30d supply, fill #5
  Filled 2022-05-23: qty 9, 30d supply, fill #6

## 2021-07-24 MED ORDER — FREESTYLE LANCETS MISC
3 refills | Status: DC
  Filled 2021-07-24: qty 100, 25d supply, fill #0
  Filled 2021-11-25: qty 100, 25d supply, fill #1

## 2021-08-12 DIAGNOSIS — G4733 Obstructive sleep apnea (adult) (pediatric): Secondary | ICD-10-CM | POA: Diagnosis not present

## 2021-08-18 DIAGNOSIS — L400 Psoriasis vulgaris: Secondary | ICD-10-CM | POA: Diagnosis not present

## 2021-08-18 DIAGNOSIS — L4050 Arthropathic psoriasis, unspecified: Secondary | ICD-10-CM | POA: Diagnosis not present

## 2021-09-10 DIAGNOSIS — Z6837 Body mass index (BMI) 37.0-37.9, adult: Secondary | ICD-10-CM | POA: Diagnosis not present

## 2021-09-10 DIAGNOSIS — G5601 Carpal tunnel syndrome, right upper limb: Secondary | ICD-10-CM | POA: Diagnosis not present

## 2021-09-10 DIAGNOSIS — E669 Obesity, unspecified: Secondary | ICD-10-CM | POA: Diagnosis not present

## 2021-09-10 DIAGNOSIS — M2550 Pain in unspecified joint: Secondary | ICD-10-CM | POA: Diagnosis not present

## 2021-09-10 DIAGNOSIS — L409 Psoriasis, unspecified: Secondary | ICD-10-CM | POA: Diagnosis not present

## 2021-09-10 DIAGNOSIS — R5383 Other fatigue: Secondary | ICD-10-CM | POA: Diagnosis not present

## 2021-09-10 DIAGNOSIS — L4050 Arthropathic psoriasis, unspecified: Secondary | ICD-10-CM | POA: Diagnosis not present

## 2021-09-18 ENCOUNTER — Other Ambulatory Visit (HOSPITAL_COMMUNITY): Payer: Self-pay

## 2021-09-23 ENCOUNTER — Other Ambulatory Visit (HOSPITAL_COMMUNITY): Payer: Self-pay

## 2021-09-24 ENCOUNTER — Other Ambulatory Visit (HOSPITAL_COMMUNITY): Payer: Self-pay

## 2021-09-24 ENCOUNTER — Ambulatory Visit: Payer: 59 | Attending: Internal Medicine | Admitting: Pharmacist

## 2021-09-24 DIAGNOSIS — Z79899 Other long term (current) drug therapy: Secondary | ICD-10-CM

## 2021-09-24 MED ORDER — TALTZ 80 MG/ML ~~LOC~~ SOSY: PREFILLED_SYRINGE | SUBCUTANEOUS | 4 refills | Status: DC

## 2021-09-24 MED ORDER — TALTZ 80 MG/ML ~~LOC~~ SOSY
PREFILLED_SYRINGE | SUBCUTANEOUS | 4 refills | Status: DC
  Filled 2021-09-24: qty 1, fill #0
  Filled 2021-09-25: qty 1, 28d supply, fill #0
  Filled 2021-10-22: qty 1, 28d supply, fill #1
  Filled 2021-12-04: qty 1, 28d supply, fill #2
  Filled 2022-01-05: qty 1, 28d supply, fill #3
  Filled 2022-02-01 – 2022-02-26 (×5): qty 1, 28d supply, fill #4

## 2021-09-24 NOTE — Progress Notes (Signed)
   S: Patient presents today for review of their specialty medication therapy.  Patient is getting ready to restart Taltz for psoriatic arthritis. Patient is managed by Leafy Kindle for this.   Adherence: denies any issues - has taken in the past.  Efficacy: reports that it worked well for her in the past. Did not have any adverse effects while on it.   Dosing:  Psoriatic arthritis: SubQ: 160 mg once, followed by 80 mg every 4 weeks; may administer alone or in combination with conventional disease-modifying antirheumatic drugs (eg, methotrexate).   Dose adjustments: Renal: no dose adjustments (has not been studied) Hepatic: no dose adjustments (has not been studied)  Screening: TB test: completed per patient  Monitoring: S/sx of infection: denies CBC: monitored by [facility] rheumatology S/sx of hypersensitivity: denies   O:     No results found for: "WBC", "HGB", "HCT", "MCV", "PLT"    Chemistry      Component Value Date/Time   CREATININE 0.50 01/03/2018 1656   No results found for: "CALCIUM", "ALKPHOS", "AST", "ALT", "BILITOT"     A/P: 1. Medication review: Patient is about to restart Taltz for psoriatic arthritis. Reviewed the medication with the patient, including the following: Donnetta Hail is a monoclonal antibody used in the treatment of ankylosing spondylitis, plaque psoriasis, and psoriatic arthritis. Administration instructions were provided: Subcutaneous: Allow to reach room temperature prior to injection (30 minutes). Do not shake. Inject full amount into the upper arms, thighs or any quadrant of the abdomen; administer each injection at a different anatomic location than a previous injection and avoid areas where the skin is tender, bruised, erythematous, indurated, or affected by psoriasis. Administration in the upper, outer arm may be performed by a caregiver or health care provider. Ixekizumab is intended for use under the guidance and supervision of a physician;  may be self-injected by the patient following proper training in SubQ injection technique. Possible adverse effects include neutropenia, increased risk of infection, local injection site reaction, and antibody development (loss of efficacy of drug). May increase risk of Crohn disease or ulcerative colitis. Live vaccinations should be avoided. No recommendations for any changes.   Benard Halsted, PharmD, [place], [place] [phone]

## 2021-09-25 ENCOUNTER — Other Ambulatory Visit (HOSPITAL_COMMUNITY): Payer: Self-pay

## 2021-09-28 ENCOUNTER — Other Ambulatory Visit (HOSPITAL_COMMUNITY): Payer: Self-pay

## 2021-09-29 ENCOUNTER — Other Ambulatory Visit (HOSPITAL_COMMUNITY): Payer: Self-pay

## 2021-09-29 MED ORDER — LOSARTAN POTASSIUM-HCTZ 50-12.5 MG PO TABS
ORAL_TABLET | ORAL | 0 refills | Status: DC
  Filled 2021-09-29: qty 90, 90d supply, fill #0

## 2021-09-29 MED ORDER — SERTRALINE HCL 100 MG PO TABS
100.0000 mg | ORAL_TABLET | Freq: Every day | ORAL | 0 refills | Status: DC
Start: 2021-09-29 — End: 2022-01-11
  Filled 2021-09-29: qty 90, 90d supply, fill #0

## 2021-10-20 ENCOUNTER — Other Ambulatory Visit (HOSPITAL_COMMUNITY): Payer: Self-pay

## 2021-10-21 ENCOUNTER — Other Ambulatory Visit (HOSPITAL_COMMUNITY): Payer: Self-pay

## 2021-10-22 ENCOUNTER — Other Ambulatory Visit (HOSPITAL_COMMUNITY): Payer: Self-pay

## 2021-10-23 ENCOUNTER — Other Ambulatory Visit (HOSPITAL_COMMUNITY): Payer: Self-pay

## 2021-10-24 ENCOUNTER — Other Ambulatory Visit (HOSPITAL_COMMUNITY): Payer: Self-pay

## 2021-10-26 ENCOUNTER — Other Ambulatory Visit (HOSPITAL_COMMUNITY): Payer: Self-pay

## 2021-10-27 ENCOUNTER — Other Ambulatory Visit (HOSPITAL_COMMUNITY): Payer: Self-pay

## 2021-11-10 ENCOUNTER — Other Ambulatory Visit (HOSPITAL_COMMUNITY): Payer: Self-pay

## 2021-11-13 ENCOUNTER — Other Ambulatory Visit (HOSPITAL_COMMUNITY): Payer: Self-pay

## 2021-11-16 ENCOUNTER — Other Ambulatory Visit (HOSPITAL_COMMUNITY): Payer: Self-pay

## 2021-11-23 DIAGNOSIS — J069 Acute upper respiratory infection, unspecified: Secondary | ICD-10-CM | POA: Diagnosis not present

## 2021-11-25 ENCOUNTER — Other Ambulatory Visit (HOSPITAL_COMMUNITY): Payer: Self-pay

## 2021-11-27 ENCOUNTER — Other Ambulatory Visit (HOSPITAL_COMMUNITY): Payer: Self-pay

## 2021-12-04 ENCOUNTER — Other Ambulatory Visit (HOSPITAL_COMMUNITY): Payer: Self-pay

## 2021-12-07 ENCOUNTER — Other Ambulatory Visit (HOSPITAL_COMMUNITY): Payer: Self-pay

## 2021-12-14 DIAGNOSIS — Z6838 Body mass index (BMI) 38.0-38.9, adult: Secondary | ICD-10-CM | POA: Diagnosis not present

## 2021-12-14 DIAGNOSIS — G5601 Carpal tunnel syndrome, right upper limb: Secondary | ICD-10-CM | POA: Diagnosis not present

## 2021-12-14 DIAGNOSIS — E669 Obesity, unspecified: Secondary | ICD-10-CM | POA: Diagnosis not present

## 2021-12-14 DIAGNOSIS — R5383 Other fatigue: Secondary | ICD-10-CM | POA: Diagnosis not present

## 2021-12-14 DIAGNOSIS — L409 Psoriasis, unspecified: Secondary | ICD-10-CM | POA: Diagnosis not present

## 2021-12-14 DIAGNOSIS — M2550 Pain in unspecified joint: Secondary | ICD-10-CM | POA: Diagnosis not present

## 2021-12-14 DIAGNOSIS — L4050 Arthropathic psoriasis, unspecified: Secondary | ICD-10-CM | POA: Diagnosis not present

## 2021-12-24 ENCOUNTER — Other Ambulatory Visit (HOSPITAL_COMMUNITY): Payer: Self-pay

## 2021-12-28 ENCOUNTER — Other Ambulatory Visit (HOSPITAL_COMMUNITY): Payer: Self-pay

## 2021-12-30 ENCOUNTER — Other Ambulatory Visit (HOSPITAL_COMMUNITY): Payer: Self-pay

## 2022-01-05 ENCOUNTER — Other Ambulatory Visit (HOSPITAL_COMMUNITY): Payer: Self-pay

## 2022-01-06 ENCOUNTER — Other Ambulatory Visit (HOSPITAL_COMMUNITY): Payer: Self-pay

## 2022-01-09 ENCOUNTER — Other Ambulatory Visit (HOSPITAL_COMMUNITY): Payer: Self-pay

## 2022-01-11 ENCOUNTER — Other Ambulatory Visit (HOSPITAL_COMMUNITY): Payer: Self-pay

## 2022-01-11 MED ORDER — SERTRALINE HCL 100 MG PO TABS
100.0000 mg | ORAL_TABLET | Freq: Every day | ORAL | 0 refills | Status: DC
  Filled 2022-01-11: qty 30, 30d supply, fill #0

## 2022-01-11 MED ORDER — LOSARTAN POTASSIUM-HCTZ 50-12.5 MG PO TABS
1.0000 | ORAL_TABLET | Freq: Every day | ORAL | 0 refills | Status: DC
  Filled 2022-01-11: qty 90, 90d supply, fill #0

## 2022-01-12 ENCOUNTER — Other Ambulatory Visit (HOSPITAL_COMMUNITY): Payer: Self-pay

## 2022-01-13 ENCOUNTER — Other Ambulatory Visit (HOSPITAL_COMMUNITY): Payer: Self-pay

## 2022-01-13 MED ORDER — METFORMIN HCL 1000 MG PO TABS
1000.0000 mg | ORAL_TABLET | Freq: Two times a day (BID) | ORAL | 2 refills | Status: DC
  Filled 2022-01-13: qty 180, 90d supply, fill #0
  Filled 2022-04-26: qty 180, 90d supply, fill #1
  Filled 2022-09-13 – 2022-10-09 (×3): qty 180, 90d supply, fill #2

## 2022-01-18 ENCOUNTER — Other Ambulatory Visit (HOSPITAL_COMMUNITY): Payer: Self-pay

## 2022-01-19 ENCOUNTER — Other Ambulatory Visit (HOSPITAL_COMMUNITY): Payer: Self-pay

## 2022-01-26 ENCOUNTER — Other Ambulatory Visit (HOSPITAL_COMMUNITY): Payer: Self-pay

## 2022-01-28 ENCOUNTER — Other Ambulatory Visit (HOSPITAL_COMMUNITY): Payer: Self-pay

## 2022-02-01 ENCOUNTER — Other Ambulatory Visit (HOSPITAL_COMMUNITY): Payer: Self-pay

## 2022-02-10 ENCOUNTER — Other Ambulatory Visit (HOSPITAL_COMMUNITY): Payer: Self-pay

## 2022-02-19 ENCOUNTER — Other Ambulatory Visit: Payer: Self-pay

## 2022-02-19 ENCOUNTER — Other Ambulatory Visit (HOSPITAL_COMMUNITY): Payer: Self-pay

## 2022-02-19 MED ORDER — SERTRALINE HCL 100 MG PO TABS
100.0000 mg | ORAL_TABLET | Freq: Every day | ORAL | 0 refills | Status: DC
  Filled 2022-02-19: qty 30, 30d supply, fill #0

## 2022-02-20 ENCOUNTER — Other Ambulatory Visit (HOSPITAL_COMMUNITY): Payer: Self-pay

## 2022-02-23 ENCOUNTER — Other Ambulatory Visit: Payer: Self-pay

## 2022-02-26 ENCOUNTER — Other Ambulatory Visit (HOSPITAL_COMMUNITY): Payer: Self-pay

## 2022-02-26 ENCOUNTER — Other Ambulatory Visit: Payer: Self-pay

## 2022-03-01 ENCOUNTER — Other Ambulatory Visit (HOSPITAL_COMMUNITY): Payer: Self-pay

## 2022-03-01 ENCOUNTER — Other Ambulatory Visit: Payer: Self-pay

## 2022-03-03 ENCOUNTER — Other Ambulatory Visit (HOSPITAL_COMMUNITY): Payer: Self-pay

## 2022-03-04 ENCOUNTER — Other Ambulatory Visit (HOSPITAL_COMMUNITY): Payer: Self-pay

## 2022-03-05 ENCOUNTER — Other Ambulatory Visit (HOSPITAL_COMMUNITY): Payer: Self-pay

## 2022-03-05 ENCOUNTER — Other Ambulatory Visit: Payer: Self-pay

## 2022-03-08 ENCOUNTER — Other Ambulatory Visit (HOSPITAL_COMMUNITY): Payer: Self-pay

## 2022-03-18 ENCOUNTER — Other Ambulatory Visit (HOSPITAL_COMMUNITY): Payer: Self-pay

## 2022-03-24 ENCOUNTER — Other Ambulatory Visit (HOSPITAL_COMMUNITY): Payer: Self-pay

## 2022-03-26 ENCOUNTER — Other Ambulatory Visit (HOSPITAL_COMMUNITY): Payer: Self-pay

## 2022-03-30 ENCOUNTER — Other Ambulatory Visit (HOSPITAL_COMMUNITY): Payer: Self-pay

## 2022-03-31 ENCOUNTER — Other Ambulatory Visit (HOSPITAL_COMMUNITY): Payer: Self-pay

## 2022-03-31 MED ORDER — SERTRALINE HCL 100 MG PO TABS
ORAL_TABLET | ORAL | 0 refills | Status: DC
  Filled 2022-03-31: qty 30, 30d supply, fill #0

## 2022-03-31 MED ORDER — LOSARTAN POTASSIUM-HCTZ 50-12.5 MG PO TABS
ORAL_TABLET | ORAL | 0 refills | Status: DC
  Filled 2022-03-31: qty 90, 90d supply, fill #0

## 2022-04-01 ENCOUNTER — Other Ambulatory Visit: Payer: Self-pay

## 2022-04-01 ENCOUNTER — Other Ambulatory Visit: Payer: Self-pay | Admitting: Pharmacist

## 2022-04-01 ENCOUNTER — Other Ambulatory Visit (HOSPITAL_COMMUNITY): Payer: Self-pay

## 2022-04-01 DIAGNOSIS — L4050 Arthropathic psoriasis, unspecified: Secondary | ICD-10-CM | POA: Diagnosis not present

## 2022-04-01 MED ORDER — TALTZ 80 MG/ML ~~LOC~~ SOSY
PREFILLED_SYRINGE | SUBCUTANEOUS | 5 refills | Status: DC
  Filled 2022-04-01: qty 1, 28d supply, fill #0
  Filled 2022-04-26 – 2022-04-29 (×3): qty 1, 28d supply, fill #1
  Filled 2022-05-25: qty 1, 28d supply, fill #2
  Filled 2022-06-22: qty 1, 28d supply, fill #3
  Filled 2022-07-26: qty 1, 28d supply, fill #4
  Filled 2022-09-13: qty 1, 28d supply, fill #5

## 2022-04-01 MED ORDER — TALTZ 80 MG/ML ~~LOC~~ SOSY: PREFILLED_SYRINGE | SUBCUTANEOUS | 5 refills | Status: DC

## 2022-04-02 ENCOUNTER — Other Ambulatory Visit (HOSPITAL_COMMUNITY): Payer: Self-pay

## 2022-04-05 ENCOUNTER — Other Ambulatory Visit: Payer: Self-pay

## 2022-04-12 ENCOUNTER — Other Ambulatory Visit (HOSPITAL_COMMUNITY): Payer: Self-pay

## 2022-04-26 ENCOUNTER — Other Ambulatory Visit (HOSPITAL_COMMUNITY): Payer: Self-pay

## 2022-04-27 ENCOUNTER — Other Ambulatory Visit (HOSPITAL_COMMUNITY): Payer: Self-pay

## 2022-04-27 ENCOUNTER — Other Ambulatory Visit: Payer: Self-pay

## 2022-04-27 MED ORDER — ROSUVASTATIN CALCIUM 5 MG PO TABS
5.0000 mg | ORAL_TABLET | ORAL | 0 refills | Status: DC
  Filled 2022-04-27: qty 15, 35d supply, fill #0

## 2022-04-29 ENCOUNTER — Other Ambulatory Visit (HOSPITAL_COMMUNITY): Payer: Self-pay

## 2022-04-29 ENCOUNTER — Other Ambulatory Visit: Payer: Self-pay

## 2022-04-30 ENCOUNTER — Other Ambulatory Visit (HOSPITAL_COMMUNITY): Payer: Self-pay

## 2022-05-03 ENCOUNTER — Other Ambulatory Visit: Payer: Self-pay

## 2022-05-03 ENCOUNTER — Other Ambulatory Visit (HOSPITAL_COMMUNITY): Payer: Self-pay

## 2022-05-24 ENCOUNTER — Other Ambulatory Visit (HOSPITAL_COMMUNITY): Payer: Self-pay

## 2022-05-24 ENCOUNTER — Other Ambulatory Visit: Payer: Self-pay

## 2022-05-24 MED ORDER — SUMATRIPTAN SUCCINATE 100 MG PO TABS
ORAL_TABLET | ORAL | 3 refills | Status: DC
  Filled 2022-05-24 – 2022-06-14 (×3): qty 9, 30d supply, fill #0

## 2022-05-25 ENCOUNTER — Other Ambulatory Visit (HOSPITAL_COMMUNITY): Payer: Self-pay

## 2022-05-28 ENCOUNTER — Other Ambulatory Visit (HOSPITAL_COMMUNITY): Payer: Self-pay

## 2022-05-31 ENCOUNTER — Other Ambulatory Visit: Payer: Self-pay

## 2022-05-31 ENCOUNTER — Other Ambulatory Visit (HOSPITAL_COMMUNITY): Payer: Self-pay

## 2022-06-01 ENCOUNTER — Other Ambulatory Visit (HOSPITAL_COMMUNITY): Payer: Self-pay

## 2022-06-02 ENCOUNTER — Other Ambulatory Visit (HOSPITAL_COMMUNITY): Payer: Self-pay

## 2022-06-03 ENCOUNTER — Other Ambulatory Visit: Payer: Self-pay

## 2022-06-03 ENCOUNTER — Other Ambulatory Visit (HOSPITAL_COMMUNITY): Payer: Self-pay

## 2022-06-03 MED ORDER — SERTRALINE HCL 100 MG PO TABS
100.0000 mg | ORAL_TABLET | Freq: Every day | ORAL | 0 refills | Status: DC
  Filled 2022-06-03 – 2022-06-14 (×2): qty 30, 30d supply, fill #0

## 2022-06-03 MED ORDER — ROSUVASTATIN CALCIUM 5 MG PO TABS
5.0000 mg | ORAL_TABLET | ORAL | 0 refills | Status: DC
  Filled 2022-06-03 – 2022-06-14 (×2): qty 15, 35d supply, fill #0

## 2022-06-04 ENCOUNTER — Encounter (HOSPITAL_COMMUNITY): Payer: Self-pay

## 2022-06-04 ENCOUNTER — Other Ambulatory Visit (HOSPITAL_COMMUNITY): Payer: Self-pay

## 2022-06-08 ENCOUNTER — Other Ambulatory Visit (HOSPITAL_COMMUNITY): Payer: Self-pay

## 2022-06-08 ENCOUNTER — Other Ambulatory Visit: Payer: Self-pay

## 2022-06-14 ENCOUNTER — Other Ambulatory Visit (HOSPITAL_COMMUNITY): Payer: Self-pay

## 2022-06-22 ENCOUNTER — Other Ambulatory Visit: Payer: Self-pay

## 2022-06-22 ENCOUNTER — Other Ambulatory Visit (HOSPITAL_COMMUNITY): Payer: Self-pay

## 2022-06-25 ENCOUNTER — Other Ambulatory Visit (HOSPITAL_COMMUNITY): Payer: Self-pay

## 2022-06-25 ENCOUNTER — Emergency Department (HOSPITAL_COMMUNITY): Payer: Commercial Managed Care - PPO

## 2022-06-25 ENCOUNTER — Emergency Department (HOSPITAL_COMMUNITY)
Admission: EM | Admit: 2022-06-25 | Discharge: 2022-06-25 | Disposition: A | Discharge status: Home / Self Care | Payer: Self-pay | Attending: Emergency Medicine | Admitting: Emergency Medicine

## 2022-06-25 ENCOUNTER — Other Ambulatory Visit: Payer: Self-pay

## 2022-06-25 DIAGNOSIS — Z794 Long term (current) use of insulin: Secondary | ICD-10-CM | POA: Insufficient documentation

## 2022-06-25 DIAGNOSIS — N898 Other specified noninflammatory disorders of vagina: Secondary | ICD-10-CM | POA: Insufficient documentation

## 2022-06-25 DIAGNOSIS — J020 Streptococcal pharyngitis: Secondary | ICD-10-CM | POA: Diagnosis not present

## 2022-06-25 DIAGNOSIS — Z79899 Other long term (current) drug therapy: Secondary | ICD-10-CM | POA: Insufficient documentation

## 2022-06-25 DIAGNOSIS — Z7984 Long term (current) use of oral hypoglycemic drugs: Secondary | ICD-10-CM | POA: Insufficient documentation

## 2022-06-25 LAB — CBC WITH DIFFERENTIAL/PLATELET
Abs Immature Granulocytes: 0.02 10*3/uL (ref 0.00–0.07)
Basophils Absolute: 0 10*3/uL (ref 0.0–0.1)
Basophils Relative: 0 %
Eosinophils Absolute: 0.1 10*3/uL (ref 0.0–0.5)
Eosinophils Relative: 1 %
HCT: 44.6 % (ref 36.0–46.0)
Hemoglobin: 15 g/dL (ref 12.0–15.0)
Immature Granulocytes: 0 %
Lymphocytes Relative: 4 %
Lymphs Abs: 0.4 10*3/uL — ABNORMAL LOW (ref 0.7–4.0)
MCH: 29 pg (ref 26.0–34.0)
MCHC: 33.6 g/dL (ref 30.0–36.0)
MCV: 86.3 fL (ref 80.0–100.0)
Monocytes Absolute: 0.5 10*3/uL (ref 0.1–1.0)
Monocytes Relative: 5 %
Neutro Abs: 8.9 10*3/uL — ABNORMAL HIGH (ref 1.7–7.7)
Neutrophils Relative %: 90 %
Platelets: 84 10*3/uL — ABNORMAL LOW (ref 150–400)
RBC: 5.17 MIL/uL — ABNORMAL HIGH (ref 3.87–5.11)
RDW: 13.7 % (ref 11.5–15.5)
WBC: 9.9 10*3/uL (ref 4.0–10.5)
nRBC: 0 % (ref 0.0–0.2)

## 2022-06-25 LAB — COMPREHENSIVE METABOLIC PANEL
ALT: 24 U/L (ref 0–44)
AST: 29 U/L (ref 15–41)
Albumin: 3.9 g/dL (ref 3.5–5.0)
Alkaline Phosphatase: 42 U/L (ref 38–126)
Anion gap: 10 (ref 5–15)
BUN: 13 mg/dL (ref 6–20)
CO2: 20 mmol/L — ABNORMAL LOW (ref 22–32)
Calcium: 8.6 mg/dL — ABNORMAL LOW (ref 8.9–10.3)
Chloride: 104 mmol/L (ref 98–111)
Creatinine, Ser: 0.77 mg/dL (ref 0.44–1.00)
GFR, Estimated: >60 mL/min (ref >60)
Glucose, Bld: 172 mg/dL — ABNORMAL HIGH (ref 70–99)
Potassium: 3 mmol/L — ABNORMAL LOW (ref 3.5–5.1)
Sodium: 134 mmol/L — ABNORMAL LOW (ref 135–145)
Total Bilirubin: 2.3 mg/dL — ABNORMAL HIGH (ref 0.3–1.2)
Total Protein: 7.8 g/dL (ref 6.5–8.1)

## 2022-06-25 LAB — LACTIC ACID, PLASMA
Lactic Acid, Venous: 1.5 mmol/L (ref 0.5–1.9)
Lactic Acid, Venous: 1.6 mmol/L (ref 0.5–1.9)

## 2022-06-25 LAB — WET PREP, GENITAL
Clue Cells Wet Prep HPF POC: NONE SEEN
Sperm: NONE SEEN
Trich, Wet Prep: NONE SEEN
WBC, Wet Prep HPF POC: >=10 — AB (ref <10)

## 2022-06-25 LAB — GROUP A STREP BY PCR: Group A Strep by PCR: DETECTED — AB

## 2022-06-25 MED ORDER — PENICILLIN G BENZATHINE 1200000 UNIT/2ML IM SUSY
2.4000 10*6.[IU] | PREFILLED_SYRINGE | Freq: Once | INTRAMUSCULAR | Status: AC
  Administered 2022-06-25: 2.4 10*6.[IU] via INTRAMUSCULAR
  Filled 2022-06-25: qty 4

## 2022-06-25 MED ORDER — FLUCONAZOLE 150 MG PO TABS
150.0000 mg | ORAL_TABLET | Freq: Every day | ORAL | 1 refills | Status: DC
  Filled 2022-06-25 – 2022-06-26 (×2): qty 1, 1d supply, fill #0
  Filled 2022-06-28 – 2022-07-13 (×3): qty 1, 1d supply, fill #1

## 2022-06-25 MED ORDER — SODIUM CHLORIDE 0.9 % IV BOLUS
2000.0000 mL | Freq: Once | INTRAVENOUS | Status: AC
  Administered 2022-06-25: 2000 mL via INTRAVENOUS

## 2022-06-25 MED ORDER — ACETAMINOPHEN 500 MG PO TABS
1000.0000 mg | ORAL_TABLET | Freq: Four times a day (QID) | ORAL | Status: DC | PRN
  Administered 2022-06-25: 1000 mg via ORAL
  Filled 2022-06-25: qty 2

## 2022-06-25 NOTE — ED Triage Notes (Addendum)
Pt. Stated, Kirsten Rodriguez just not felt good for 2 days. Ive had chills and fever for the last couple of days. Ive taken Ibuprofen.. Last dose was 730. I ve had a bad sore throat for 2 days and my daughter had strep.

## 2022-06-25 NOTE — Discharge Instructions (Addendum)
Follow up with your Physician for recheck  

## 2022-06-25 NOTE — ED Provider Notes (Signed)
[place] EMERGENCY DEPARTMENT AT [facility] Provider Note   CSN: [id number] Arrival date & time: 06/25/22  4098     History  Chief Complaint  Patient presents with   Chills   Fever   Kirsten Rodriguez    Kirsten Rodriguez is a 47 y.o. female.  Patient complains of a Kirsten Rodriguez.  Patient also reports she has had a vaginal discharge.  Patient reports her fever today was 103.  The history is provided by the patient. No language interpreter was used.  Fever Severity:  Moderate Onset quality:  Gradual Timing:  Constant Progression:  Worsening Chronicity:  New Relieved by:  Nothing Worsened by:  Nothing Associated symptoms: cough and Kirsten Rodriguez   Kirsten Rodriguez       Home Medications Prior to Admission medications   Medication Sig Start Date End Date Taking? Authorizing Provider  fluconazole (DIFLUCAN) 150 MG tablet Take 1 tablet (150 mg total) by mouth daily. 06/25/22  Yes Cheron Schaumann K, PA-C  azithromycin (ZITHROMAX) 250 MG tablet Take 2 tablets by mouth on day 1, then take 1 tablet daily on days 2 -5. 11/17/20     benzonatate (TESSALON) 100 MG capsule Take 1 capsule (100 mg total) by mouth 2 (two) times daily as needed for cough. 03/21/18   Zachery Dauer, NP  empagliflozin (JARDIANCE) 10 MG TABS tablet Take 1 tablet (10 mg total) by mouth every morning with or without food 07/24/21     fluticasone (FLONASE) 50 MCG/ACT nasal spray Place 2 sprays into both nostrils daily. 03/21/18   Zachery Dauer, NP  glimepiride (AMARYL) 1 MG tablet TAKE 1 TABLET BY MOUTH ONCE DAILY WITH BREAKFAST OR FIRST MAIN MEAL OF THE DAY. 01/30/20 01/29/21  Masneri, Wille Celeste, DO  glimepiride (AMARYL) 1 MG tablet Take 1 tablet (1 mg total) by mouth daily with breakfast or the first main meal of the day. 06/25/20     glimepiride (AMARYL) 1 MG tablet Take 1 tablet (1 mg total) by mouth every morning with breakfast or first meal of the day 08/21/20     glucose blood (FREESTYLE LITE) test strip Use to check  blood sugar 3-4 times a day 07/24/21     guaiFENesin-codeine 100-10 MG/5ML syrup Take 5-10 mLs by mouth every 4-6 hours as needed for 5 days 11/17/20     Insulin Pen Needle 32G X 4 MM MISC Use as directed. 06/25/20     Ixekizumab (TALTZ) 80 MG/ML SOSY Inject 1 ml Subcutaneous Every 4 weeks 30 days 04/01/22   Quentin Angst, MD  Lancets (FREESTYLE) lancets Use to test blood sugar 3 - 4 times a day 07/24/21     Liraglutide -Weight Management (SAXENDA) 18 MG/3ML SOPN See admin instructions.    [provider]  Liraglutide -Weight Management (SAXENDA) 18 MG/3ML SOPN Inject 3 mg into the skin daily. 06/25/20     losartan (COZAAR) 100 MG tablet 2 tablets    [provider]  losartan-hydrochlorothiazide (HYZAAR) 50-12.5 MG tablet Take 1 tablet by mouth daily. 06/25/20     losartan-hydrochlorothiazide (HYZAAR) 50-12.5 MG tablet Take 1 tablet by mouth daily. Blood pressure goal is 130/80 03/31/21     losartan-hydrochlorothiazide (HYZAAR) 50-12.5 MG tablet Take 1 tablet by mouth daily. (Blood pressure goal is 130/80) 03/31/22     metFORMIN (GLUCOPHAGE) 1000 MG tablet TAKE 1 TABLET BY MOUTH 2 TIMES A DAY WITH A MEAL 12/20/19 12/28/20  Masneri, Carollee Herter M, DO  metFORMIN (GLUCOPHAGE) 1000 MG tablet Take 1 tablet  by mouth 2 times daily with a meal. 01/13/22     metFORMIN (GLUCOPHAGE) 500 MG tablet Take by mouth 2 (two) times daily with a meal.    [provider]  rosuvastatin (CRESTOR) 5 MG tablet Take 1 tablet (5 mg total) by mouth 3 (three) times a week 06/04/22     Semaglutide, 2 MG/DOSE, (OZEMPIC, 2 MG/DOSE,) 8 MG/3ML SOPN Inject 2mg  under the skin once a week as directed. 04/22/21     Semaglutide, 2 MG/DOSE, (OZEMPIC, 2 MG/DOSE,) 8 MG/3ML SOPN Inject 2mg  into the skin once a week 07/24/21     sertraline (ZOLOFT) 100 MG tablet TAKE 1 TABLET BY MOUTH ONCE DAILY. NEEDS OFFICE VISIT FOR FURTHER REFILLS. 09/11/19 09/23/20  Masneri, Wille Celeste, DO  sertraline (ZOLOFT) 100 MG tablet Take 1 tablet (100 mg  total) by mouth daily. 06/25/20     sertraline (ZOLOFT) 100 MG tablet Take 1 tablet by mouth daily. 03/31/21     sertraline (ZOLOFT) 100 MG tablet Take 1 tablet (100 mg total) by mouth daily. 06/03/22     sertraline (ZOLOFT) 50 MG tablet Take 50 mg by mouth daily.    [provider]  SUMAtriptan (IMITREX) 100 MG tablet TAKE 1 TABLET BY MOUTH AT ONSET OF HEADACHE,MAY BE REPEATED IN 2 HOURS IF STILL HAVING MIGRAINE MAX DOSE=200 MG/DAY 01/03/20 01/02/21  Masneri, Wille Celeste, DO  SUMAtriptan (IMITREX) 100 MG tablet Take 1 tablet by mouth at onset of headach. ,Maybe repeat in 2 hours if still having migraine. Max daily dose of 200mg  per day. 06/25/20     SUMAtriptan (IMITREX) 100 MG tablet Take 1 tablet by mouth at onset of headache. May be repeated in 2 hours if still having migraine. max dose=200 mg/day 11/11/20     SUMAtriptan (IMITREX) 100 MG tablet Take 1 tablet at onset of headache, may be repeated in 2 hours if still having migraine (max daily dose 2 tablets) 05/24/22     SUMAtriptan (IMITREX) 50 MG tablet Take 50 mg by mouth every 2 (two) hours as needed for migraine. May repeat in 2 hours if headache persists or recurs.    [provider]      Allergies    Patient has no known allergies.    Review of Systems   Review of Systems  Constitutional:  Positive for fever.  HENT:  Positive for Kirsten Rodriguez.   Respiratory:  Positive for cough.   All other systems reviewed and are negative.   Physical Exam Updated Vital Signs BP 114/80   Pulse (!) 107   Temp (!) 101.5 F (38.6 C) (Oral)   Resp 14   Ht 5\' 9"  (1.753 m)   Wt 111.1 kg   SpO2 96%   BMI 36.18 kg/m  Physical Exam Vitals and nursing note reviewed.  Constitutional:      General: She is not in acute distress.    Appearance: She is well-developed.  HENT:     Head: Normocephalic and atraumatic.     Mouth/Rodriguez:     Pharynx: Pharyngeal swelling and posterior oropharyngeal erythema present.     Tonsils: Tonsillar  exudate present.  Eyes:     Conjunctiva/sclera: Conjunctivae normal.  Cardiovascular:     Rate and Rhythm: Normal rate and regular rhythm.     Heart sounds: No murmur heard. Pulmonary:     Effort: Pulmonary effort is normal. No respiratory distress.     Breath sounds: Normal breath sounds.  Abdominal:     Palpations: Abdomen is  soft.     Tenderness: There is no abdominal tenderness.  Musculoskeletal:        General: No swelling.     Cervical back: Neck supple.  Skin:    General: Skin is warm and dry.     Capillary Refill: Capillary refill takes less than 2 seconds.  Neurological:     Mental Status: She is alert.  Psychiatric:        Mood and Affect: Mood normal.     ED Results / Procedures / Treatments   Labs (all labs ordered are listed, but only abnormal results are displayed) Labs Reviewed  GROUP A STREP BY PCR - Abnormal; Notable for the following components:      Result Value   Group A Strep by PCR DETECTED (*)    All other components within normal limits  WET PREP, GENITAL - Abnormal; Notable for the following components:   Yeast Wet Prep HPF POC PRESENT (*)    WBC, Wet Prep HPF POC >=10 (*)    All other components within normal limits  COMPREHENSIVE METABOLIC PANEL - Abnormal; Notable for the following components:   Sodium 134 (*)    Potassium 3.0 (*)    CO2 20 (*)    Glucose, Bld 172 (*)    Calcium 8.6 (*)    Total Bilirubin 2.3 (*)    All other components within normal limits  CBC WITH DIFFERENTIAL/PLATELET - Abnormal; Notable for the following components:   RBC 5.17 (*)    Platelets 84 (*)    Neutro Abs 8.9 (*)    Lymphs Abs 0.4 (*)    All other components within normal limits  LACTIC ACID, PLASMA  LACTIC ACID, PLASMA  URINALYSIS, ROUTINE W REFLEX MICROSCOPIC  GC/CHLAMYDIA PROBE AMP ([place]) NOT AT [facility]    EKG None  Radiology DG Chest 2 View  Result Date: 06/25/2022 CLINICAL DATA:  Fever and chills, has not felt well for 2 days, bad Kirsten  Rodriguez, daughter has strep EXAM: CHEST - 2 VIEW COMPARISON:  11/30/2018 FINDINGS: Normal heart size, mediastinal contours, and pulmonary vascularity. Mild RIGHT basilar atelectasis. Lungs otherwise clear. No acute infiltrate, pleural effusion, or pneumothorax. IMPRESSION: Mild RIGHT basilar atelectasis. Electronically Signed   By: Ulyses Southward M.D.   On: 06/25/2022 10:16    Procedures Procedures    Medications Ordered in ED Medications  acetaminophen (TYLENOL) tablet 1,000 mg (1,000 mg Oral Given 06/25/22 0941)  sodium chloride 0.9 % bolus 2,000 mL (0 mLs Intravenous Stopped 06/25/22 1204)  penicillin g benzathine (BICILLIN LA) 1200000 UNIT/2ML injection 2.4 Million Units (2.4 Million Units Intramuscular Given 06/25/22 1240)    ED Course/ Medical Decision Making/ A&P                             Medical Decision Making Complains of a cough fever Kirsten Rodriguez and vaginal discharge.  Amount and/or Complexity of Data Reviewed Labs: ordered. Decision-making details documented in ED Course.    Details: Labs ordered reviewed and interpreted strep screen is positive wet prep is positive for yeast Radiology: ordered and independent interpretation performed. Decision-making details documented in ED Course.    Details: Chest x-ray ordered reviewed and interpreted no acute abnormality  Risk OTC drugs. Prescription drug management. Risk Details: Strep screen returned positive patient is given Bicillin 2.4 patient is given a prescription for Diflucan she is advised to return to the emergency department if any problems  Final Clinical Impression(s) / ED Diagnoses Final diagnoses:  Strep pharyngitis    Rx / DC Orders ED Discharge Orders          Ordered    fluconazole (DIFLUCAN) 150 MG tablet  Daily        06/25/22 1303           An After Visit Summary was printed and given to the patient.    Elson Areas, [place] 06/25/22 1537    Terrilee Files, MD 06/26/22  [phone]

## 2022-06-26 ENCOUNTER — Other Ambulatory Visit (HOSPITAL_COMMUNITY): Payer: Self-pay

## 2022-06-28 ENCOUNTER — Other Ambulatory Visit (HOSPITAL_COMMUNITY): Payer: Self-pay

## 2022-06-28 ENCOUNTER — Other Ambulatory Visit: Payer: Self-pay

## 2022-06-28 LAB — GC/CHLAMYDIA PROBE AMP (~~LOC~~) NOT AT ARMC
Chlamydia: NEGATIVE
Comment: NEGATIVE
Comment: NORMAL
Neisseria Gonorrhea: NEGATIVE

## 2022-06-29 ENCOUNTER — Other Ambulatory Visit (HOSPITAL_COMMUNITY): Payer: Self-pay

## 2022-06-29 ENCOUNTER — Encounter (HOSPITAL_COMMUNITY): Payer: Self-pay

## 2022-06-29 ENCOUNTER — Other Ambulatory Visit: Payer: Self-pay

## 2022-06-30 ENCOUNTER — Other Ambulatory Visit (HOSPITAL_COMMUNITY): Payer: Self-pay

## 2022-06-30 DIAGNOSIS — E785 Hyperlipidemia, unspecified: Secondary | ICD-10-CM | POA: Diagnosis not present

## 2022-06-30 DIAGNOSIS — L270 Generalized skin eruption due to drugs and medicaments taken internally: Secondary | ICD-10-CM | POA: Diagnosis not present

## 2022-06-30 DIAGNOSIS — L03313 Cellulitis of chest wall: Secondary | ICD-10-CM | POA: Diagnosis not present

## 2022-06-30 DIAGNOSIS — L400 Psoriasis vulgaris: Secondary | ICD-10-CM | POA: Diagnosis not present

## 2022-06-30 DIAGNOSIS — E1169 Type 2 diabetes mellitus with other specified complication: Secondary | ICD-10-CM | POA: Diagnosis not present

## 2022-06-30 DIAGNOSIS — I10 Essential (primary) hypertension: Secondary | ICD-10-CM | POA: Diagnosis not present

## 2022-06-30 DIAGNOSIS — G43109 Migraine with aura, not intractable, without status migrainosus: Secondary | ICD-10-CM | POA: Diagnosis not present

## 2022-06-30 MED ORDER — ROSUVASTATIN CALCIUM 5 MG PO TABS
5.0000 mg | ORAL_TABLET | ORAL | 1 refills | Status: DC
  Filled 2022-06-30: qty 36, 90d supply, fill #0
  Filled 2022-07-08 – 2022-07-12 (×2): qty 36, 84d supply, fill #0
  Filled 2022-09-27 – 2022-10-09 (×2): qty 36, 84d supply, fill #1

## 2022-06-30 MED ORDER — SUMATRIPTAN SUCCINATE 100 MG PO TABS
ORAL_TABLET | ORAL | 3 refills | Status: DC
  Filled 2022-06-30: qty 9, 5d supply, fill #0
  Filled 2022-07-08: qty 9, 30d supply, fill #0
  Filled 2022-08-24: qty 9, 30d supply, fill #1
  Filled 2022-10-11: qty 9, 30d supply, fill #2

## 2022-06-30 MED ORDER — LOSARTAN POTASSIUM-HCTZ 50-12.5 MG PO TABS
ORAL_TABLET | ORAL | 1 refills | Status: DC
  Filled 2022-06-30 – 2022-07-13 (×2): qty 90, 90d supply, fill #0

## 2022-06-30 MED ORDER — JARDIANCE 10 MG PO TABS
10.0000 mg | ORAL_TABLET | Freq: Every morning | ORAL | 1 refills | Status: DC
  Filled 2022-06-30 – 2022-07-13 (×2): qty 90, 90d supply, fill #0

## 2022-07-02 DIAGNOSIS — E1169 Type 2 diabetes mellitus with other specified complication: Secondary | ICD-10-CM | POA: Diagnosis not present

## 2022-07-02 DIAGNOSIS — L0390 Cellulitis, unspecified: Secondary | ICD-10-CM | POA: Diagnosis not present

## 2022-07-02 DIAGNOSIS — N6489 Other specified disorders of breast: Secondary | ICD-10-CM | POA: Diagnosis not present

## 2022-07-02 DIAGNOSIS — R92323 Mammographic fibroglandular density, bilateral breasts: Secondary | ICD-10-CM | POA: Diagnosis not present

## 2022-07-02 DIAGNOSIS — N644 Mastodynia: Secondary | ICD-10-CM | POA: Diagnosis not present

## 2022-07-02 DIAGNOSIS — E785 Hyperlipidemia, unspecified: Secondary | ICD-10-CM | POA: Diagnosis not present

## 2022-07-02 DIAGNOSIS — N6311 Unspecified lump in the right breast, upper outer quadrant: Secondary | ICD-10-CM | POA: Diagnosis not present

## 2022-07-06 ENCOUNTER — Other Ambulatory Visit (HOSPITAL_COMMUNITY): Payer: Self-pay

## 2022-07-08 ENCOUNTER — Other Ambulatory Visit (HOSPITAL_COMMUNITY): Payer: Self-pay

## 2022-07-08 ENCOUNTER — Other Ambulatory Visit: Payer: Self-pay

## 2022-07-09 ENCOUNTER — Encounter (HOSPITAL_COMMUNITY): Payer: Self-pay

## 2022-07-09 ENCOUNTER — Other Ambulatory Visit (HOSPITAL_COMMUNITY): Payer: Self-pay

## 2022-07-09 MED ORDER — SERTRALINE HCL 100 MG PO TABS
50.0000 mg | ORAL_TABLET | Freq: Every day | ORAL | 3 refills | Status: DC
  Filled 2022-07-09: qty 45, 90d supply, fill #0

## 2022-07-10 ENCOUNTER — Other Ambulatory Visit (HOSPITAL_COMMUNITY): Payer: Self-pay

## 2022-07-12 ENCOUNTER — Other Ambulatory Visit: Payer: Self-pay

## 2022-07-12 ENCOUNTER — Other Ambulatory Visit (HOSPITAL_COMMUNITY): Payer: Self-pay

## 2022-07-13 ENCOUNTER — Other Ambulatory Visit (HOSPITAL_COMMUNITY): Payer: Self-pay

## 2022-07-13 ENCOUNTER — Other Ambulatory Visit: Payer: Self-pay

## 2022-07-14 ENCOUNTER — Other Ambulatory Visit: Payer: Self-pay

## 2022-07-14 ENCOUNTER — Other Ambulatory Visit (HOSPITAL_COMMUNITY): Payer: Self-pay

## 2022-07-14 MED ORDER — CLOBETASOL PROPIONATE 0.025 % EX CREA: TOPICAL_CREAM | CUTANEOUS | 1 refills | Status: DC

## 2022-07-14 MED ORDER — CLOBETASOL PROPIONATE 0.05 % EX CREA
1.0000 | TOPICAL_CREAM | Freq: Two times a day (BID) | CUTANEOUS | 1 refills | Status: AC
  Filled 2022-07-14: qty 30, 14d supply, fill #0
  Filled 2022-11-15: qty 30, 14d supply, fill #1

## 2022-07-21 ENCOUNTER — Other Ambulatory Visit (HOSPITAL_COMMUNITY): Payer: Self-pay

## 2022-07-26 ENCOUNTER — Other Ambulatory Visit (HOSPITAL_COMMUNITY): Payer: Self-pay

## 2022-08-02 ENCOUNTER — Other Ambulatory Visit (HOSPITAL_COMMUNITY): Payer: Self-pay

## 2022-08-02 ENCOUNTER — Other Ambulatory Visit: Payer: Self-pay

## 2022-08-03 ENCOUNTER — Other Ambulatory Visit (HOSPITAL_COMMUNITY): Payer: Self-pay

## 2022-08-24 ENCOUNTER — Other Ambulatory Visit (HOSPITAL_COMMUNITY): Payer: Self-pay

## 2022-08-25 ENCOUNTER — Other Ambulatory Visit: Payer: Self-pay

## 2022-08-25 ENCOUNTER — Other Ambulatory Visit (HOSPITAL_COMMUNITY): Payer: Self-pay

## 2022-08-25 MED ORDER — OZEMPIC (2 MG/DOSE) 8 MG/3ML ~~LOC~~ SOPN
2.0000 mg | PEN_INJECTOR | SUBCUTANEOUS | 1 refills | Status: DC
  Filled 2022-08-25: qty 9, 84d supply, fill #0

## 2022-08-27 ENCOUNTER — Other Ambulatory Visit (HOSPITAL_COMMUNITY): Payer: Self-pay

## 2022-08-30 ENCOUNTER — Encounter (HOSPITAL_COMMUNITY): Payer: Self-pay

## 2022-08-30 ENCOUNTER — Other Ambulatory Visit (HOSPITAL_COMMUNITY): Payer: Self-pay

## 2022-09-06 ENCOUNTER — Emergency Department (HOSPITAL_BASED_OUTPATIENT_CLINIC_OR_DEPARTMENT_OTHER)
Admission: EM | Admit: 2022-09-06 | Discharge: 2022-09-06 | Disposition: A | Discharge status: Home / Self Care | Payer: Commercial Managed Care - PPO | Source: Home / Self Care | Attending: Emergency Medicine | Admitting: Emergency Medicine

## 2022-09-06 ENCOUNTER — Encounter (HOSPITAL_BASED_OUTPATIENT_CLINIC_OR_DEPARTMENT_OTHER): Payer: Self-pay | Admitting: Emergency Medicine

## 2022-09-06 ENCOUNTER — Telehealth: Payer: Self-pay | Admitting: Surgery

## 2022-09-06 ENCOUNTER — Other Ambulatory Visit: Payer: Self-pay

## 2022-09-06 ENCOUNTER — Emergency Department (HOSPITAL_BASED_OUTPATIENT_CLINIC_OR_DEPARTMENT_OTHER): Payer: Commercial Managed Care - PPO

## 2022-09-06 DIAGNOSIS — E119 Type 2 diabetes mellitus without complications: Secondary | ICD-10-CM | POA: Insufficient documentation

## 2022-09-06 DIAGNOSIS — Z7984 Long term (current) use of oral hypoglycemic drugs: Secondary | ICD-10-CM | POA: Insufficient documentation

## 2022-09-06 DIAGNOSIS — Z794 Long term (current) use of insulin: Secondary | ICD-10-CM | POA: Diagnosis not present

## 2022-09-06 DIAGNOSIS — Z6836 Body mass index (BMI) 36.0-36.9, adult: Secondary | ICD-10-CM | POA: Diagnosis not present

## 2022-09-06 DIAGNOSIS — I10 Essential (primary) hypertension: Secondary | ICD-10-CM | POA: Diagnosis not present

## 2022-09-06 DIAGNOSIS — R1901 Right upper quadrant abdominal swelling, mass and lump: Secondary | ICD-10-CM | POA: Diagnosis not present

## 2022-09-06 DIAGNOSIS — N83291 Other ovarian cyst, right side: Secondary | ICD-10-CM | POA: Diagnosis not present

## 2022-09-06 DIAGNOSIS — N83201 Unspecified ovarian cyst, right side: Secondary | ICD-10-CM | POA: Diagnosis not present

## 2022-09-06 DIAGNOSIS — N838 Other noninflammatory disorders of ovary, fallopian tube and broad ligament: Secondary | ICD-10-CM

## 2022-09-06 DIAGNOSIS — K7689 Other specified diseases of liver: Secondary | ICD-10-CM | POA: Diagnosis not present

## 2022-09-06 DIAGNOSIS — R109 Unspecified abdominal pain: Secondary | ICD-10-CM | POA: Diagnosis not present

## 2022-09-06 DIAGNOSIS — K449 Diaphragmatic hernia without obstruction or gangrene: Secondary | ICD-10-CM | POA: Diagnosis not present

## 2022-09-06 DIAGNOSIS — E669 Obesity, unspecified: Secondary | ICD-10-CM | POA: Diagnosis not present

## 2022-09-06 LAB — CBC WITH DIFFERENTIAL/PLATELET
Abs Immature Granulocytes: 0.03 10*3/uL (ref 0.00–0.07)
Basophils Absolute: 0.1 10*3/uL (ref 0.0–0.1)
Basophils Relative: 1 %
Eosinophils Absolute: 0.1 10*3/uL (ref 0.0–0.5)
Eosinophils Relative: 1 %
HCT: 45.1 % (ref 36.0–46.0)
Hemoglobin: 15.4 g/dL — ABNORMAL HIGH (ref 12.0–15.0)
Immature Granulocytes: 0 %
Lymphocytes Relative: 18 %
Lymphs Abs: 1.6 10*3/uL (ref 0.7–4.0)
MCH: 28.7 pg (ref 26.0–34.0)
MCHC: 34.1 g/dL (ref 30.0–36.0)
MCV: 84.1 fL (ref 80.0–100.0)
Monocytes Absolute: 0.5 10*3/uL (ref 0.1–1.0)
Monocytes Relative: 6 %
Neutro Abs: 6.8 10*3/uL (ref 1.7–7.7)
Neutrophils Relative %: 74 %
Platelets: 121 10*3/uL — ABNORMAL LOW (ref 150–400)
RBC: 5.36 MIL/uL — ABNORMAL HIGH (ref 3.87–5.11)
RDW: 13.2 % (ref 11.5–15.5)
WBC: 9.1 10*3/uL (ref 4.0–10.5)
nRBC: 0 % (ref 0.0–0.2)

## 2022-09-06 LAB — COMPREHENSIVE METABOLIC PANEL
ALT: 27 U/L (ref 0–44)
AST: 29 U/L (ref 15–41)
Albumin: 4.5 g/dL (ref 3.5–5.0)
Alkaline Phosphatase: 37 U/L — ABNORMAL LOW (ref 38–126)
Anion gap: 9 (ref 5–15)
BUN: 14 mg/dL (ref 6–20)
CO2: 27 mmol/L (ref 22–32)
Calcium: 9.8 mg/dL (ref 8.9–10.3)
Chloride: 100 mmol/L (ref 98–111)
Creatinine, Ser: 0.52 mg/dL (ref 0.44–1.00)
GFR, Estimated: >60 mL/min (ref >60)
Glucose, Bld: 148 mg/dL — ABNORMAL HIGH (ref 70–99)
Potassium: 3.4 mmol/L — ABNORMAL LOW (ref 3.5–5.1)
Sodium: 136 mmol/L (ref 135–145)
Total Bilirubin: 2.4 mg/dL — ABNORMAL HIGH (ref 0.3–1.2)
Total Protein: 8 g/dL (ref 6.5–8.1)

## 2022-09-06 LAB — LIPASE, BLOOD: Lipase: 47 U/L (ref 11–51)

## 2022-09-06 LAB — URINALYSIS, W/ REFLEX TO CULTURE (INFECTION SUSPECTED)
Bacteria, UA: NONE SEEN
Bilirubin Urine: NEGATIVE
Glucose, UA: NEGATIVE mg/dL
Ketones, ur: 15 mg/dL — AB
Leukocytes,Ua: NEGATIVE
Nitrite: NEGATIVE
Specific Gravity, Urine: 1.028 (ref 1.005–1.030)
pH: 5 (ref 5.0–8.0)

## 2022-09-06 LAB — PREGNANCY, URINE: Preg Test, Ur: NEGATIVE

## 2022-09-06 MED ORDER — ONDANSETRON 4 MG PO TBDP: 4.0000 mg | ORAL_TABLET | Freq: Three times a day (TID) | ORAL | 0 refills | Status: DC | PRN

## 2022-09-06 MED ORDER — ONDANSETRON HCL 4 MG/2ML IJ SOLN
4.0000 mg | Freq: Once | INTRAMUSCULAR | Status: AC
  Administered 2022-09-06: 4 mg via INTRAVENOUS
  Filled 2022-09-06: qty 2

## 2022-09-06 MED ORDER — HYDROCODONE-ACETAMINOPHEN 5-325 MG PO TABS: 1.0000 | ORAL_TABLET | Freq: Four times a day (QID) | ORAL | 0 refills | Status: DC | PRN

## 2022-09-06 MED ORDER — MORPHINE SULFATE (PF) 4 MG/ML IV SOLN
4.0000 mg | Freq: Once | INTRAVENOUS | Status: AC
  Administered 2022-09-06: 4 mg via INTRAVENOUS
  Filled 2022-09-06: qty 1

## 2022-09-06 MED ORDER — SODIUM CHLORIDE 0.9 % IV BOLUS
1000.0000 mL | Freq: Once | INTRAVENOUS | Status: AC
  Administered 2022-09-06: 1000 mL via INTRAVENOUS

## 2022-09-06 MED ORDER — SODIUM CHLORIDE 0.9 % IV SOLN: INTRAVENOUS | Status: DC

## 2022-09-06 MED ORDER — HYDROMORPHONE HCL 1 MG/ML IJ SOLN
1.0000 mg | Freq: Once | INTRAMUSCULAR | Status: AC
  Administered 2022-09-06: 1 mg via INTRAVENOUS
  Filled 2022-09-06: qty 1

## 2022-09-06 MED ORDER — PROCHLORPERAZINE EDISYLATE 10 MG/2ML IJ SOLN
10.0000 mg | Freq: Once | INTRAMUSCULAR | Status: AC
  Administered 2022-09-06: 10 mg via INTRAVENOUS
  Filled 2022-09-06: qty 2

## 2022-09-06 NOTE — ED Triage Notes (Signed)
Pt arrives pov, slow gait c/o RT flank pain radiating to lower RT ABD with nausea starting last night. Denies dysuria

## 2022-09-06 NOTE — Telephone Encounter (Signed)
Patient's husband called regarding an urgent referral and scheduling new patient appointment. Scheduled for 7/22 at 9am. Patient's husband verbalized understanding.

## 2022-09-06 NOTE — ED Provider Notes (Signed)
[place] EMERGENCY DEPARTMENT AT [facility] Provider Note   CSN: [id number] Arrival date & time: 09/06/22  4098     History  Chief Complaint  Patient presents with   Flank Pain    Kirsten Rodriguez is a 47 y.o. female.   Flank Pain     47 year old female with medical history significant for DM2, HTN, psoriatic arthritis who presents to the emergency department with right-sided flank pain, nausea and vomiting.  The patient states that she had sudden onset flank pain last night that radiated to her right lower quadrant of her abdomen.  It was sharp and severe.  She denies any history of kidney stones.  She endorses NBNB emesis multiple episodes throughout the night.  She denies any dysuria or increased urinary frequency.  She denies any fevers or chills.  No other vaginal symptoms.  She states that since coming to the ER and receiving pain medicine, her pain is eased off some.  Home Medications Prior to Admission medications   Medication Sig Start Date End Date Taking? Authorizing Provider  azithromycin (ZITHROMAX) 250 MG tablet Take 2 tablets by mouth on day 1, then take 1 tablet daily on days 2 -5. 11/17/20     benzonatate (TESSALON) 100 MG capsule Take 1 capsule (100 mg total) by mouth 2 (two) times daily as needed for cough. 03/21/18   Zachery Dauer, NP  clobetasol cream (TEMOVATE) 0.05 % Apply 1 Application topically 2 (two) times daily for 14 days 07/14/22     Clobetasol Propionate 0.025 % CREA Apply to affected areas 2 times a day for 14 days 07/13/22     empagliflozin (JARDIANCE) 10 MG TABS tablet Take 1 tablet (10 mg total) by mouth every morning with or without food 07/24/21     empagliflozin (JARDIANCE) 10 MG TABS tablet Take 1 tablet (10 mg total) by mouth in the morning with or without food 06/30/22     fluconazole (DIFLUCAN) 150 MG tablet Take 1 tablet (150 mg total) by mouth daily. 06/25/22   Elson Areas, PA-C  fluticasone (FLONASE) 50 MCG/ACT nasal spray Place 2  sprays into both nostrils daily. 03/21/18   Zachery Dauer, NP  glimepiride (AMARYL) 1 MG tablet TAKE 1 TABLET BY MOUTH ONCE DAILY WITH BREAKFAST OR FIRST MAIN MEAL OF THE DAY. 01/30/20 01/29/21  Masneri, Wille Celeste, DO  glimepiride (AMARYL) 1 MG tablet Take 1 tablet (1 mg total) by mouth daily with breakfast or the first main meal of the day. 06/25/20     glimepiride (AMARYL) 1 MG tablet Take 1 tablet (1 mg total) by mouth every morning with breakfast or first meal of the day 08/21/20     glucose blood (FREESTYLE LITE) test strip Use to check blood sugar 3-4 times a day 07/24/21     guaiFENesin-codeine 100-10 MG/5ML syrup Take 5-10 mLs by mouth every 4-6 hours as needed for 5 days 11/17/20     Insulin Pen Needle 32G X 4 MM MISC Use as directed. 06/25/20     Ixekizumab (TALTZ) 80 MG/ML SOSY Inject 1 ml Subcutaneous Every 4 weeks 30 days 04/01/22   Quentin Angst, MD  Lancets (FREESTYLE) lancets Use to test blood sugar 3 - 4 times a day 07/24/21     Liraglutide -Weight Management (SAXENDA) 18 MG/3ML SOPN See admin instructions.    [provider]  Liraglutide -Weight Management (SAXENDA) 18 MG/3ML SOPN Inject 3 mg into the skin daily. 06/25/20     losartan (COZAAR) 100 MG  tablet 2 tablets    [provider]  losartan-hydrochlorothiazide (HYZAAR) 50-12.5 MG tablet Take 1 tablet by mouth daily. 06/25/20     losartan-hydrochlorothiazide (HYZAAR) 50-12.5 MG tablet Take 1 tablet by mouth daily. Blood pressure goal is 130/80 03/31/21     losartan-hydrochlorothiazide (HYZAAR) 50-12.5 MG tablet Take 1 tablet by mouth daily. (Blood pressure goal is 130/80) 90 days 06/30/22     metFORMIN (GLUCOPHAGE) 1000 MG tablet TAKE 1 TABLET BY MOUTH 2 TIMES A DAY WITH A MEAL 12/20/19 12/28/20  Masneri, Carollee Herter M, DO  metFORMIN (GLUCOPHAGE) 1000 MG tablet Take 1 tablet by mouth 2 times daily with a meal. 01/13/22     metFORMIN (GLUCOPHAGE) 500 MG tablet Take by mouth 2 (two) times daily with a meal.    [provider]  rosuvastatin (CRESTOR) 5 MG tablet Take 1 tablet (5 mg total) by mouth 3 (three) times a week. 06/30/22     Semaglutide, 2 MG/DOSE, (OZEMPIC, 2 MG/DOSE,) 8 MG/3ML SOPN Inject 2mg  under the skin once a week as directed. 04/22/21     Semaglutide, 2 MG/DOSE, (OZEMPIC, 2 MG/DOSE,) 8 MG/3ML SOPN Inject 2 mg into the skin once a week. 08/25/22     sertraline (ZOLOFT) 100 MG tablet TAKE 1 TABLET BY MOUTH ONCE DAILY. NEEDS OFFICE VISIT FOR FURTHER REFILLS. 09/11/19 09/23/20  Masneri, Wille Celeste, DO  sertraline (ZOLOFT) 100 MG tablet Take 1 tablet (100 mg total) by mouth daily. 06/25/20     sertraline (ZOLOFT) 100 MG tablet Take 1 tablet by mouth daily. 03/31/21     sertraline (ZOLOFT) 100 MG tablet Take 1 tablet (100 mg total) by mouth daily. 06/03/22     sertraline (ZOLOFT) 100 MG tablet Take 0.5 tablets (50 mg total) by mouth daily. 07/09/22     sertraline (ZOLOFT) 50 MG tablet Take 50 mg by mouth daily.    [provider]  SUMAtriptan (IMITREX) 100 MG tablet TAKE 1 TABLET BY MOUTH AT ONSET OF HEADACHE,MAY BE REPEATED IN 2 HOURS IF STILL HAVING MIGRAINE MAX DOSE=200 MG/DAY 01/03/20 01/02/21  Masneri, Wille Celeste, DO  SUMAtriptan (IMITREX) 100 MG tablet Take 1 tablet by mouth at onset of headach. ,Maybe repeat in 2 hours if still having migraine. Max daily dose of 200mg  per day. 06/25/20     SUMAtriptan (IMITREX) 100 MG tablet Take 1 tablet by mouth at onset of headache. May be repeated in 2 hours if still having migraine. max dose=200 mg/day 11/11/20     SUMAtriptan (IMITREX) 100 MG tablet Take 1 tablet at onset of headache, may be repeated in 2 hours if still having migraine (max daily dose 2 tablets) 05/24/22     SUMAtriptan (IMITREX) 100 MG tablet Take 1 tablet at onset of headache, may be repeated in 2 hours if still having migraine (max daily dose 2 tablets) 06/30/22     SUMAtriptan (IMITREX) 50 MG tablet Take 50 mg by mouth every 2 (two) hours as needed for migraine. May repeat in 2 hours if headache persists or  recurs.    [provider]      Allergies    Patient has no known allergies.    Review of Systems   Review of Systems  Gastrointestinal:  Positive for nausea and vomiting.  Genitourinary:  Positive for flank pain.  All other systems reviewed and are negative.   Physical Exam Updated Vital Signs BP 131/88 (BP Location: Right Arm)   Pulse 72   Temp 98 F (36.7 C)  Resp 14   Wt 113.4 kg   SpO2 97%   BMI 36.92 kg/m  Physical Exam Vitals and nursing note reviewed.  Constitutional:      General: She is not in acute distress.    Appearance: She is well-developed. She is obese.  HENT:     Head: Normocephalic and atraumatic.  Eyes:     Conjunctiva/sclera: Conjunctivae normal.  Cardiovascular:     Rate and Rhythm: Normal rate and regular rhythm.  Pulmonary:     Effort: Pulmonary effort is normal. No respiratory distress.     Breath sounds: Normal breath sounds.  Abdominal:     Palpations: Abdomen is soft.     Tenderness: There is abdominal tenderness in the right lower quadrant and suprapubic area. There is right CVA tenderness.     Comments: Right lower quadrant and suprapubic tenderness to palpation, no rebound or guarding, right CVA tenderness  Musculoskeletal:        General: No swelling.     Cervical back: Neck supple.  Skin:    General: Skin is warm and dry.     Capillary Refill: Capillary refill takes less than 2 seconds.  Neurological:     Mental Status: She is alert.  Psychiatric:        Mood and Affect: Mood normal.     ED Results / Procedures / Treatments   Labs (all labs ordered are listed, but only abnormal results are displayed) Labs Reviewed  URINALYSIS, W/ REFLEX TO CULTURE (INFECTION SUSPECTED) - Abnormal; Notable for the following components:      Result Value   APPearance HAZY (*)    Hgb urine dipstick MODERATE (*)    Ketones, ur 15 (*)    Protein, ur TRACE (*)    All other components within normal limits  COMPREHENSIVE METABOLIC  PANEL - Abnormal; Notable for the following components:   Potassium 3.4 (*)    Glucose, Bld 148 (*)    Alkaline Phosphatase 37 (*)    Total Bilirubin 2.4 (*)    All other components within normal limits  CBC WITH DIFFERENTIAL/PLATELET - Abnormal; Notable for the following components:   RBC 5.36 (*)    Hemoglobin 15.4 (*)    Platelets 121 (*)    All other components within normal limits  PREGNANCY, URINE  LIPASE, BLOOD  CA 125  CEA    EKG None  Radiology US PELVIC COMPLETE W TRANSVAGINAL AND TORSION R/O  Result Date: 09/06/2022 CLINICAL DATA:  Ovarian mass. EXAM: TRANSABDOMINAL AND TRANSVAGINAL ULTRASOUND OF PELVIS DOPPLER ULTRASOUND OF OVARIES TECHNIQUE: Both transabdominal and transvaginal ultrasound examinations of the pelvis were performed. Transabdominal technique was performed for global imaging of the pelvis including uterus, ovaries, adnexal regions, and pelvic cul-de-sac. It was necessary to proceed with endovaginal exam following the transabdominal exam to visualize the ovaries. Color and duplex Doppler ultrasound was utilized to evaluate blood flow to the ovaries. COMPARISON:  CT of the abdomen September 06, 2022 FINDINGS: Uterus Measurements: 11.0 x 6.2 x 6.8 cm = volume: 241 mL. There is a 2.1 x 2.0 x 1.9 cm intramural myometrial mass within the right anterior fundus. Endometrium Thickness: 8 mm.  No focal abnormality visualized. Right ovary Measurements: There is a large complex cystic and solid mass containing thick vascular septations in the right adnexa. The right ovary is inseparable from the mass. The mass measures 16.6 x 12.9 x 13.0 cm. The solid component measures at least 12 cm. Left ovary Measurements: 3.1 x 2.2 x 2.0  cm = volume: 7.1 mL. Normal appearance/no adnexal mass. Pulsed Doppler evaluation of the left ovary demonstrates normal low-resistance arterial and venous waveforms. Other findings No abnormal free fluid. IMPRESSION: Large cystic and solid right adnexal mass,  suspicious for primary ovarian malignancy. The right ovary is inseparable from the mass. Recommend surgical consultation. Single myometrial mass within the fundus of the uterus may represent a fibroid. Normal left ovary. Electronically Signed   By: Ted Mcalpine M.D.   On: 09/06/2022 11:58   CT RENAL STONE STUDY  Result Date: 09/06/2022 CLINICAL DATA:  Abdominal/flank pain, stone suspected EXAM: CT ABDOMEN AND PELVIS WITHOUT CONTRAST TECHNIQUE: Multidetector CT imaging of the abdomen and pelvis was performed following the standard protocol without IV contrast. RADIATION DOSE REDUCTION: This exam was performed according to the departmental dose-optimization program which includes automated exposure control, adjustment of the mA and/or kV according to patient size and/or use of iterative reconstruction technique. COMPARISON:  CT scan abdomen and pelvis from 01/03/2018. FINDINGS: Lower chest: The lung bases are clear. No pleural effusion. The heart is normal in size. No pericardial effusion. Hepatobiliary: The liver is normal in size. There is subtle liver surface irregularity/nodularity, concerning for cirrhotic configuration. There is an ill-defined, hypoattenuating, subcapsular lesion in the right hepatic lobe, segment 7 measuring approximately 3.5 x 3.7 cm, better characterized on the prior MRI from 01/06/2018. No intrahepatic or extrahepatic bile duct dilation. There is a peripherally rim calcified 2.1 x 2.4 cm gallstone. Normal gallbladder wall thickness. No pericholecystic inflammatory changes. Pancreas: Unremarkable. No pancreatic ductal dilatation or surrounding inflammatory changes. Spleen: Spleen is enlarged measuring upto 10.7 x 16.9 cm orthogonally on coronal plane. No focal lesion. Adrenals/Urinary Tract: Adrenal glands are unremarkable. No suspicious renal mass. No hydronephrosis. No renal or ureteric calculi. Urinary bladder is under distended, precluding optimal assessment. However, no large  mass or stones identified. No perivesical fat stranding. Stomach/Bowel: There is a small sliding hiatal hernia. No disproportionate dilation of the small or large bowel loops. No evidence of abnormal bowel wall thickening or inflammatory changes. The appendix is unremarkable. Vascular/Lymphatic: No ascites or pneumoperitoneum. There are multiple indeterminate retroperitoneal lymph nodes with short axis less than 1 cm. No aneurysmal dilation of the major abdominal arteries. Reproductive: Normal-sized anteverted uterus. There is an approximately 3.7 x 5.1 cm lobulation along the right fundal region of the uterus, which may represent an underlying leiomyoma. Left ovary is unremarkable. Adjacent to and inseparable from the right ovary and the fundus of the uterus is a large predominantly cystic mass measuring approximately 14.2 x 16.2 x 17.3 cm (anteroposterior x transverse x craniocaudal) which exhibits multiple intervening mural nodules/hyperattenuating septations. This is indeterminate but concerning for neoplastic process arising from the right ovary or uterus. OBGYN consultation and contrast-enhanced MRI pelvis is recommended. Other: The visualized soft tissues and abdominal wall are unremarkable. Musculoskeletal: No suspicious osseous lesions. There are mild multilevel degenerative changes in the visualized spine. IMPRESSION: 1. No obstructive uropathy. No nephrolithiasis. No acute inflammatory process identified within the abdomen or pelvis. 2. Large predominantly cystic mass adjacent to and inseparable from the right ovary and the fundus of the uterus measuring approximately 14.2 x 16.2 x 17.3 cm (AP x TR x CC) which exhibits multiple intervening mural nodules/hyperattenuating septations. This is indeterminate but concerning for neoplastic process arising from the right ovary or uterus. OBGYN consultation and contrast-enhanced MRI pelvis is recommended. 3. Grossly stable, ill-defined hypoattenuating right  hepatic lobe segment 7 lesion, better evaluated on the prior MRI from  2019. 4. Multiple indeterminate retroperitoneal lymph nodes with short axis less than 1 cm. 5. Multiple other nonacute observations, as described above. Electronically Signed   By: Jules Schick M.D.   On: 09/06/2022 10:33    Procedures Procedures    Medications Ordered in ED Medications  sodium chloride 0.9 % bolus 1,000 mL (0 mLs Intravenous Stopped 09/06/22 1031)    And  0.9 %  sodium chloride infusion ( Intravenous New Bag/Given 09/06/22 1031)  morphine (PF) 4 MG/ML injection 4 mg (4 mg Intravenous Given 09/06/22 0924)  ondansetron (ZOFRAN) injection 4 mg (4 mg Intravenous Given 09/06/22 0923)  HYDROmorphone (DILAUDID) injection 1 mg (1 mg Intravenous Given 09/06/22 1158)  prochlorperazine (COMPAZINE) injection 10 mg (10 mg Intravenous Given 09/06/22 1154)    ED Course/ Medical Decision Making/ A&P                             Medical Decision Making Amount and/or Complexity of Data Reviewed Labs: ordered. Radiology: ordered.  Risk Prescription drug management.     47 year old female with medical history significant for DM2, HTN, psoriatic arthritis who presents to the emergency department with right-sided flank pain, nausea and vomiting.  The patient states that she had sudden onset flank pain last night that radiated to her right lower quadrant of her abdomen.  It was sharp and severe.  She denies any history of kidney stones.  She endorses NBNB emesis multiple episodes throughout the night.  She denies any dysuria or increased urinary frequency.  She denies any fevers or chills.  No other vaginal symptoms.  She states that since coming to the ER and receiving pain medicine, her pain is eased off some.  On arrival, the patient was vitally stable, afebrile, not tachycardic or tachypneic, BP 134/97, saturating 1 7% on room air.  Sinus rhythm noted on cardiac telemetry.  Physical exam significant for right lower  quadrant and suprapubic tenderness to palpation, no rebound or guarding, right-sided CVA tenderness present.  Differential diagnosis includes nephrolithiasis, pyelonephritis/UTI, appendicitis, diverticulitis, Less likely small bowel obstruction.  Labs: CBC without a leukocytosis or anemia, CMP with only mild hyperglycemia 148, no significant electrolyte abnormality, normal renal function, lipase normal, urine pregnancy negative, urinalysis with moderate hemoglobin, negative for UTI.  CT Stone: IMPRESSION:  1. No obstructive uropathy. No nephrolithiasis. No acute  inflammatory process identified within the abdomen or pelvis.  2. Large predominantly cystic mass adjacent to and inseparable from  the right ovary and the fundus of the uterus measuring approximately  14.2 x 16.2 x 17.3 cm (AP x TR x CC) which exhibits multiple  intervening mural nodules/hyperattenuating septations. This is  indeterminate but concerning for neoplastic process arising from the  right ovary or uterus. OBGYN consultation and contrast-enhanced MRI  pelvis is recommended.  3. Grossly stable, ill-defined hypoattenuating right hepatic lobe  segment 7 lesion, better evaluated on the prior MRI from 2019.  4. Multiple indeterminate retroperitoneal lymph nodes with short  axis less than 1 cm.  5. Multiple other nonacute observations, as described above.    Ultrasound torsion ordered given the patient's intermittent pain.  I spoke with on-call GYN oncology who recommended ordering a CEA and CA-125 which were collected and sent to lab.  The patient's pain was well-controlled on repeat assessment.  She was informed of the diagnosis of likely ovarian mass and malignancy.  Dr. Clide Cliff of GYN oncology recommended if pain can be controlled to  follow-up outpatient in clinic.  [place] torsion: IMPRESSION:  Large cystic and solid right adnexal mass, suspicious for primary  ovarian malignancy. The right ovary is inseparable from  the mass.  Recommend surgical consultation.    Single myometrial mass within the fundus of the uterus may represent  a fibroid.    Normal left ovary.   I reassessed the patient bedside, she states that her pain is better controlled.  She is comfortable with the plan for outpatient follow-up with gynecology oncology oncology.  Will prescribe a short course of Norco and Zofran for pain and nausea control.  Will plan for close follow-up with GYN Onc in clinic.   Final Clinical Impression(s) / ED Diagnoses Final diagnoses:  Ovarian mass, right    Rx / DC Orders ED Discharge Orders     None         Ernie Avena, MD 09/06/22 1221

## 2022-09-06 NOTE — Discharge Instructions (Addendum)
Please follow-up urgently with Dr. Alvester Morin of gynecology oncology in clinic. Your CT results revealed the following

## 2022-09-06 NOTE — ED Notes (Signed)
Report given to the next RN... 

## 2022-09-07 LAB — CEA: CEA: 1.9 ng/mL (ref 0.0–4.7)

## 2022-09-08 LAB — CA 125: Cancer Antigen (CA) 125: 18.8 U/mL (ref 0.0–38.1)

## 2022-09-10 ENCOUNTER — Encounter: Payer: Self-pay | Admitting: Psychiatry

## 2022-09-13 ENCOUNTER — Encounter: Payer: Self-pay | Admitting: Psychiatry

## 2022-09-13 ENCOUNTER — Other Ambulatory Visit: Payer: Self-pay

## 2022-09-13 ENCOUNTER — Inpatient Hospital Stay: Payer: Commercial Managed Care - PPO

## 2022-09-13 ENCOUNTER — Telehealth: Payer: Self-pay | Admitting: *Deleted

## 2022-09-13 ENCOUNTER — Inpatient Hospital Stay: Payer: Commercial Managed Care - PPO | Attending: Psychiatry | Admitting: Psychiatry

## 2022-09-13 ENCOUNTER — Inpatient Hospital Stay: Payer: Commercial Managed Care - PPO | Admitting: Gynecologic Oncology

## 2022-09-13 ENCOUNTER — Other Ambulatory Visit (HOSPITAL_COMMUNITY): Payer: Self-pay

## 2022-09-13 VITALS — BP 104/66 | HR 78 | Temp 98.0°F | Resp 16 | Ht 69.0 in | Wt 252.0 lb

## 2022-09-13 DIAGNOSIS — L4050 Arthropathic psoriasis, unspecified: Secondary | ICD-10-CM | POA: Insufficient documentation

## 2022-09-13 DIAGNOSIS — R110 Nausea: Secondary | ICD-10-CM | POA: Insufficient documentation

## 2022-09-13 DIAGNOSIS — R160 Hepatomegaly, not elsewhere classified: Secondary | ICD-10-CM | POA: Diagnosis not present

## 2022-09-13 DIAGNOSIS — R1900 Intra-abdominal and pelvic swelling, mass and lump, unspecified site: Secondary | ICD-10-CM | POA: Insufficient documentation

## 2022-09-13 DIAGNOSIS — N858 Other specified noninflammatory disorders of uterus: Secondary | ICD-10-CM | POA: Diagnosis not present

## 2022-09-13 DIAGNOSIS — I10 Essential (primary) hypertension: Secondary | ICD-10-CM | POA: Insufficient documentation

## 2022-09-13 DIAGNOSIS — Z6837 Body mass index (BMI) 37.0-37.9, adult: Secondary | ICD-10-CM | POA: Insufficient documentation

## 2022-09-13 DIAGNOSIS — Z7984 Long term (current) use of oral hypoglycemic drugs: Secondary | ICD-10-CM | POA: Insufficient documentation

## 2022-09-13 DIAGNOSIS — Z79899 Other long term (current) drug therapy: Secondary | ICD-10-CM | POA: Insufficient documentation

## 2022-09-13 DIAGNOSIS — Z87891 Personal history of nicotine dependence: Secondary | ICD-10-CM | POA: Diagnosis not present

## 2022-09-13 DIAGNOSIS — N838 Other noninflammatory disorders of ovary, fallopian tube and broad ligament: Secondary | ICD-10-CM

## 2022-09-13 DIAGNOSIS — N950 Postmenopausal bleeding: Secondary | ICD-10-CM | POA: Insufficient documentation

## 2022-09-13 DIAGNOSIS — E119 Type 2 diabetes mellitus without complications: Secondary | ICD-10-CM | POA: Diagnosis not present

## 2022-09-13 DIAGNOSIS — R9389 Abnormal findings on diagnostic imaging of other specified body structures: Secondary | ICD-10-CM | POA: Insufficient documentation

## 2022-09-13 LAB — LACTATE DEHYDROGENASE: LDH: 114 U/L (ref 98–192)

## 2022-09-13 LAB — HEMOGLOBIN A1C
Hgb A1c MFr Bld: 6.2 % — ABNORMAL HIGH (ref 4.8–5.6)
Mean Plasma Glucose: 131.24 mg/dL

## 2022-09-13 LAB — PREGNANCY, URINE: Preg Test, Ur: NEGATIVE

## 2022-09-13 NOTE — H&P (View-Only) (Signed)
 GYNECOLOGIC ONCOLOGY NEW PATIENT CONSULTATION  Date of Service: 09/13/2022 Referring Provider: Ernie Avena, MD [facility] Emergency Department at [facility]   ASSESSMENT AND PLAN: Kirsten Rodriguez is a 47 y.o. woman with a 17 cm complex pelvic mass and possible postmenopausal bleeding.  We reviewed that the exact etiology of the pelvic mass is unclear, but could include a benign, borderline, or malignant process.  The recommended treatment is surgical excision to make a definitive diagnosis.  Her CA125 and CEA from the emergency department were within normal limits.  Given her age I recommend additional tumor markers for germ cell and sex cord stromal tumors.  Labs ordered  Additionally, given patient's history of liver mass, redemonstrated on the recent CT, previously, recommend an MRI abdomen and pelvis to better characterize the liver mass and the pelvic mass.  Prior MRI abdomen was suggestive of hemangioma in 2019, but given some additional findings the recommendation was for follow-up MRI abdomen that did not occur.  Also, it is unclear if the patient is postmenopausal at this time but she has gone several years without a period with recent light bleeding noted in the past few months.  Will obtain FSH and LH to help characterize this.  Given possible postmenopausal status with new bleeding and endometrial stripe of 8 mm, recommended endometrial biopsy today.  See procedure note below.  Because the mass is large, at least a mini laparotomy for controlled cyst drainage will be required.  Reviewed that we would plan for at least a unilateral salpingo-oophorectomy.  Additional surgery will depend on her menopausal status and evaluation of her endometrium.  If she is postmenopausal, would plan for bilateral salpingo-oophorectomy.  Pending intraoperative findings, could consider remainder of surgery to be performed robotically versus laparotomy.  In the event of malignancy or borderline  tumor on frozen section, we will perform indicated staging procedures. We discussed that these procedures may include omentectomy pelvic and/or para-aortic lymphadenectomy, peritoneal biopsies. We would also remove any tissue concerning for metastatic disease which could require additional procedures including bowel surgery.  Patient was consented for: Mini laparotomy for controlled cyst drainage, robotic versus open unilateral salpingo-oophorectomy versus bilateral salpingo-oophorectomy, possible hysterectomy, possible staging on 10/12/22.  The risks of surgery were discussed in detail and she understands these to including but not limited to bleeding requiring a blood transfusion, infection, injury to adjacent organs (including but not limited to the bowels, bladder, ureters, nerves, blood vessels), thromboembolic events, wound separation, hernia, vaginal cuff separation, possible risk of lymphedema and lymphocyst if lymphadenectomy performed, unforseen complication, and possible need for re-exploration.  If the patient experiences any of these events, she understands that her hospitalization or recovery may be prolonged and that she may need to take additional medications for a prolonged period. The patient will receive DVT and antibiotic prophylaxis as indicated. She voiced a clear understanding. She had the opportunity to ask questions and informed consent was obtained today. She wishes to proceed.  She will proceed to the lab today for tumor markers, FSH/LH, A1c. We will follow-up with her rheumatologist regarding perioperative timing of her Taltz. She does not require preoperative clearance. Her METs are >4.  All preoperative instructions were reviewed. Postoperative expectations were also reviewed. Written handouts were provided to the patient.   A copy of this note was sent to the patient's referring provider.  Clide Cliff, MD Gynecologic Oncology   Medical Decision Making I  personally spent  TOTAL 60 minutes face-to-face and non-face-to-face in the care of  this patient, which includes all pre, intra, and post visit time on the date of service.   ------------  CC: Pelvic mass  HISTORY OF PRESENT ILLNESS:  Kirsten Rodriguez is a 46 y.o. woman who is seen in consultation at the request of Ernie Avena, MD for evaluation of pelvic mass.  Patient presented to the emergency department on 09/06/2022 for right-sided flank pain, nausea, and vomiting this pain had sudden onset the night prior.  Pain radiated to her right lower quadrant.  Her pain improved when the medication.  She underwent a CT renal stone study which noted a 14.2 x 16.2 x 17.3 predominantly cystic pelvic mass inseparable from the right ovary in the fundus of the uterus.  The mass has multiple mural nodules and hyperattenuating septations.  Scan also demonstrated multiple indeterminate retroperitoneal lymph nodes less than 1 cm.  A pelvic ultrasound was also performed which showed a large cystic and solid right adnexal mass.  Tumor markers were collected with a CA125 of 18.8 and a CEA of 1.9.  Today patient presents with her husband.  She endorses the history as above.  She reports on and off pain since the emergency department but not as severe as then.  She takes occasional oxycodone about 1 to 2 pills a day which helps treat her pain.  She has also been experiencing some nausea.  She otherwise denies abdominal bloating, early satiety, significant weight loss, change in bladder habits.  She notes a few days of constipation but that is since resolved.  Of note, she also reports that she ceased having menses at age 45.  However in the past few months she has noticed some red blood on the toilet paper when she is after voiding.  Patient's past medical history is notable for diabetes and psoriatic arthritis.  She follows with Dr. Azucena Fallen at [facility] for her psoriatic arthritis.  She is on  monthly Taltz injections.  She follows with Myles Lipps, DO at [facility] for her diabetes.  Patient is unsure of her last A1c but believes it may have been around 6.  She checks her sugar in the mornings and it ranges from 1 19-1 40.  Patient has an unknown family history in the setting of her being adopted.   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  PAST MEDICAL HISTORY: Past Medical History:  Diagnosis Date   Diabetes mellitus without complication (HCC)    Hypertension    Psoriatic arthritis (HCC)     PAST SURGICAL HISTORY: Past Surgical History:  Procedure Laterality Date   BACK SURGERY     L-spine   KNEE SURGERY Right     OB/GYN HISTORY: OB History  Gravida Para Term Preterm AB Living  3 2 2  0 1 2  SAB IAB Ectopic Multiple Live Births  1 0 0 0 2    # Outcome Date GA Lbr Len/2nd Weight Sex Type Anes PTL Lv  3 SAB           2 Term      Vag-Spont   LIV  1 Term      Vag-Spont         Age at menarche: 53 Age at menopause: 46 Hx of HRT: None, OCP use earlier in life for birth control Hx of STI: No Last pap: Thinks 2020 with PCP, normal per pt report History of abnormal pap smears: none  SCREENING STUDIES:  Last mammogram: 06/2022 Last colonoscopy: None  MEDICATIONS:  Current Outpatient Medications:  clobetasol cream (TEMOVATE) 0.05 %, Apply 1 Application topically 2 (two) times daily for 14 days, Disp: 30 g, Rfl: 1   empagliflozin (JARDIANCE) 10 MG TABS tablet, Take 1 tablet (10 mg total) by mouth every morning with or without food, Disp: 90 tablet, Rfl: 1   glucose blood (FREESTYLE LITE) test strip, Use to check blood sugar 3-4 times a day, Disp: 100 strip, Rfl: 3   guaiFENesin-codeine 100-10 MG/5ML syrup, Take 5-10 mLs by mouth every 4-6 hours as needed for 5 days, Disp: 240 mL, Rfl: 0   Ixekizumab (TALTZ) 80 MG/ML SOSY, Inject 1 ml Subcutaneous Every 4 weeks 30 days, Disp: 1 mL, Rfl: 5   Lancets (FREESTYLE) lancets, Use to test blood sugar 3 - 4 times a day,  Disp: 100 each, Rfl: 3   losartan (COZAAR) 100 MG tablet, 2 tablets, Disp: , Rfl:    losartan-hydrochlorothiazide (HYZAAR) 50-12.5 MG tablet, Take 1 tablet by mouth daily., Disp: 90 tablet, Rfl: 3   metFORMIN (GLUCOPHAGE) 1000 MG tablet, Take 1 tablet by mouth 2 times daily with a meal., Disp: 180 tablet, Rfl: 2   oxyCODONE-acetaminophen (PERCOCET/ROXICET) 5-325 MG tablet, Take by mouth every 6 (six) hours as needed for severe pain. As needed for right side pain, Disp: , Rfl:    rosuvastatin (CRESTOR) 5 MG tablet, Take 1 tablet (5 mg total) by mouth 3 (three) times a week., Disp: 36 tablet, Rfl: 1   Semaglutide, 2 MG/DOSE, (OZEMPIC, 2 MG/DOSE,) 8 MG/3ML SOPN, Inject 2mg  under the skin once a week as directed., Disp: 9 mL, Rfl: 3   sertraline (ZOLOFT) 50 MG tablet, Take 50 mg by mouth daily., Disp: , Rfl:    SUMAtriptan (IMITREX) 100 MG tablet, Take 1 tablet by mouth at onset of headach. ,Maybe repeat in 2 hours if still having migraine. Max daily dose of 200mg  per day., Disp: 27 tablet, Rfl: 1  ALLERGIES: No Known Allergies  FAMILY HISTORY: Family History  Adopted: Yes    SOCIAL HISTORY: Social History   Socioeconomic History   Marital status: Married    Spouse name: Not on file   Number of children: Not on file   Years of education: Not on file   Highest education level: Not on file  Occupational History   Not on file  Tobacco Use   Smoking status: Former    Types: Cigarettes   Smokeless tobacco: Former   Tobacco comments:    Socially in Designer, multimedia   Vaping status: Former  Substance and Sexual Activity   Alcohol use: Yes    Comment: 5-6 per week    Drug use: No   Sexual activity: Yes  Other Topics Concern   Not on file  Social History Narrative   Not on file   Social Determinants of Health   Financial Resource Strain: Not on file  Food Insecurity: Not on file  Transportation Needs: Not on file  Physical Activity: Not on file  Stress: Not on file  Social  Connections: Unknown (07/02/2022)   Received from [facility]   Social Network    Social Network: Not on file  Intimate Partner Violence: Unknown (07/02/2022)   Received from [facility]   HITS    Physically Hurt: Not on file    Insult or Talk Down To: Not on file    Threaten Physical Harm: Not on file    Scream or Curse: Not on file    REVIEW OF SYSTEMS: New patient intake form was  reviewed.  Complete 10-system review is negative except for the following: Appetite changes, blood in urine, fatigue, abdominal pain, vaginal bleeding  PHYSICAL EXAM: BP 104/66 (BP Location: Right Arm, Patient Position: Sitting)   Pulse 78   Temp 98 F (36.7 C) (Oral)   Resp 16   Ht 5\' 9"  (1.753 m)   Wt 252 lb (114.3 kg)   SpO2 96%   BMI 37.21 kg/m  Constitutional: No acute distress. Neuro/Psych: Alert, oriented.  Head and Neck: Normocephalic, atraumatic. Neck symmetric without masses. Sclera anicteric.  Respiratory: Normal work of breathing. Clear to auscultation bilaterally. Cardiovascular: Regular rate and rhythm, no murmurs, rubs, or gallops. Abdomen: Normoactive bowel sounds. Soft, non-distended.  Mass palpated in abdomen that is somewhat mobile. Extremities: Grossly normal range of motion. Warm, well perfused. No edema bilaterally. Skin: No rashes or lesions. Lymphatic: No cervical, supraclavicular, or inguinal adenopathy. Genitourinary: External genitalia without lesions. Urethral meatus without lesions or prolapse. On speculum exam, vagina and cervix without lesions. Bimanual exam reveals anteverted mobile uterus.  Mass palpated cephalad to the uterus. Exam chaperoned by Warner Mccreedy, NP   EMB Procedure: After appropriate verbal informed consent was obtained, a timeout was performed. A sterile speculum was placed in the vagina, and the area was cleaned with betadine x3. A single-tooth tenaculum was placed on the anterior lip of the cervix. An endometrial biopsy pipelle was advanced  carefully to the uterine fundus which sounded to 10cm. An adequate sample was obtained over 1 pass. The tenaculum was removed, and tenaculum sites were noted to be hemostatic. The speculum was removed from the vagina. The patient tolerated the procedure well.   UPT: negative   LABORATORY AND RADIOLOGIC DATA: Outside medical records were reviewed to synthesize the above history, along with the history and physical obtained during the visit.  Outside laboratory and imaging reports were reviewed, with pertinent results below.  I personally reviewed the outside images.  WBC  Date Value Ref Range Status  09/06/2022 9.1 4.0 - 10.5 K/uL Final   Hemoglobin  Date Value Ref Range Status  09/06/2022 15.4 (H) 12.0 - 15.0 g/dL Final   HCT  Date Value Ref Range Status  09/06/2022 45.1 36.0 - 46.0 % Final   Platelets  Date Value Ref Range Status  09/06/2022 121 (L) 150 - 400 K/uL Final   Creatinine, Ser  Date Value Ref Range Status  09/06/2022 0.52 0.44 - 1.00 mg/dL Final   AST  Date Value Ref Range Status  09/06/2022 29 15 - 41 U/L Final   ALT  Date Value Ref Range Status  09/06/2022 27 0 - 44 U/L Final   Cancer Antigen (CA) 125  Date Value Ref Range Status  09/06/2022 18.8 0.0 - 38.1 U/mL Final    Comment:    (NOTE) Roche [employer]ostics Electrochemiluminescence Immunoassay (ECLIA) Values obtained with different assay methods or kits cannot be used interchangeably.  Results cannot be interpreted as absolute evidence of the presence or absence of malignant disease. Performed At: [facility] [address] [place], [place] [id number] Jolene Schimke MD ZH:[id number]    Lab Results  Component Value Date   EXB284 18.8 09/06/2022   CEA1 1.9 09/06/2022     US PELVIC COMPLETE W TRANSVAGINAL AND TORSION R/O 09/06/2022  Narrative CLINICAL DATA:  Ovarian mass.  EXAM: TRANSABDOMINAL AND TRANSVAGINAL ULTRASOUND OF PELVIS  DOPPLER ULTRASOUND OF OVARIES  TECHNIQUE: Both  transabdominal and transvaginal ultrasound examinations of the pelvis were performed. Transabdominal technique was performed for global imaging  of the pelvis including uterus, ovaries, adnexal regions, and pelvic cul-de-sac.  It was necessary to proceed with endovaginal exam following the transabdominal exam to visualize the ovaries. Color and duplex Doppler ultrasound was utilized to evaluate blood flow to the ovaries.  COMPARISON:  CT of the abdomen September 06, 2022  FINDINGS: Uterus  Measurements: 11.0 x 6.2 x 6.8 cm = volume: 241 mL. There is a 2.1 x 2.0 x 1.9 cm intramural myometrial mass within the right anterior fundus.  Endometrium  Thickness: 8 mm.  No focal abnormality visualized.  Right ovary  Measurements: There is a large complex cystic and solid mass containing thick vascular septations in the right adnexa. The right ovary is inseparable from the mass. The mass measures 16.6 x 12.9 x 13.0 cm. The solid component measures at least 12 cm.  Left ovary  Measurements: 3.1 x 2.2 x 2.0 cm = volume: 7.1 mL. Normal appearance/no adnexal mass.  Pulsed Doppler evaluation of the left ovary demonstrates normal low-resistance arterial and venous waveforms.  Other findings  No abnormal free fluid.  IMPRESSION: Large cystic and solid right adnexal mass, suspicious for primary ovarian malignancy. The right ovary is inseparable from the mass. Recommend surgical consultation.  Single myometrial mass within the fundus of the uterus may represent a fibroid.  Normal left ovary.   Electronically Signed By: Ted Mcalpine M.D. On: 09/06/2022 11:58   CT RENAL STONE STUDY 09/06/2022  Narrative CLINICAL DATA:  Abdominal/flank pain, stone suspected  EXAM: CT ABDOMEN AND PELVIS WITHOUT CONTRAST  TECHNIQUE: Multidetector CT imaging of the abdomen and pelvis was performed following the standard protocol without IV contrast.  RADIATION DOSE REDUCTION: This exam  was performed according to the departmental dose-optimization program which includes automated exposure control, adjustment of the mA and/or kV according to patient size and/or use of iterative reconstruction technique.  COMPARISON:  CT scan abdomen and pelvis from 01/03/2018.  FINDINGS: Lower chest: The lung bases are clear. No pleural effusion. The heart is normal in size. No pericardial effusion.  Hepatobiliary: The liver is normal in size. There is subtle liver surface irregularity/nodularity, concerning for cirrhotic configuration. There is an ill-defined, hypoattenuating, subcapsular lesion in the right hepatic lobe, segment 7 measuring approximately 3.5 x 3.7 cm, better characterized on the prior MRI from 01/06/2018. No intrahepatic or extrahepatic bile duct dilation. There is a peripherally rim calcified 2.1 x 2.4 cm gallstone. Normal gallbladder wall thickness. No pericholecystic inflammatory changes.  Pancreas: Unremarkable. No pancreatic ductal dilatation or surrounding inflammatory changes.  Spleen: Spleen is enlarged measuring upto 10.7 x 16.9 cm orthogonally on coronal plane. No focal lesion.  Adrenals/Urinary Tract: Adrenal glands are unremarkable. No suspicious renal mass. No hydronephrosis. No renal or ureteric calculi. Urinary bladder is under distended, precluding optimal assessment. However, no large mass or stones identified. No perivesical fat stranding.  Stomach/Bowel: There is a small sliding hiatal hernia. No disproportionate dilation of the small or large bowel loops. No evidence of abnormal bowel wall thickening or inflammatory changes. The appendix is unremarkable.  Vascular/Lymphatic: No ascites or pneumoperitoneum. There are multiple indeterminate retroperitoneal lymph nodes with short axis less than 1 cm. No aneurysmal dilation of the major abdominal arteries.  Reproductive: Normal-sized anteverted uterus. There is an approximately 3.7 x 5.1  cm lobulation along the right fundal region of the uterus, which may represent an underlying leiomyoma. Left ovary is unremarkable. Adjacent to and inseparable from the right ovary and the fundus of the uterus is a large predominantly cystic  mass measuring approximately 14.2 x 16.2 x 17.3 cm (anteroposterior x transverse x craniocaudal) which exhibits multiple intervening mural nodules/hyperattenuating septations. This is indeterminate but concerning for neoplastic process arising from the right ovary or uterus. OBGYN consultation and contrast-enhanced MRI pelvis is recommended.  Other: The visualized soft tissues and abdominal wall are unremarkable.  Musculoskeletal: No suspicious osseous lesions. There are mild multilevel degenerative changes in the visualized spine.  IMPRESSION: 1. No obstructive uropathy. No nephrolithiasis. No acute inflammatory process identified within the abdomen or pelvis. 2. Large predominantly cystic mass adjacent to and inseparable from the right ovary and the fundus of the uterus measuring approximately 14.2 x 16.2 x 17.3 cm (AP x TR x CC) which exhibits multiple intervening mural nodules/hyperattenuating septations. This is indeterminate but concerning for neoplastic process arising from the right ovary or uterus. OBGYN consultation and contrast-enhanced MRI pelvis is recommended. 3. Grossly stable, ill-defined hypoattenuating right hepatic lobe segment 7 lesion, better evaluated on the prior MRI from 2019. 4. Multiple indeterminate retroperitoneal lymph nodes with short axis less than 1 cm. 5. Multiple other nonacute observations, as described above.   Electronically Signed By: Jules Schick M.D. On: 09/06/2022 10:33

## 2022-09-13 NOTE — Progress Notes (Signed)
GYNECOLOGIC ONCOLOGY NEW PATIENT CONSULTATION  Date of Service: 09/13/2022 Referring Provider: Ernie Avena, MD [facility] Emergency Department at [facility]   ASSESSMENT AND PLAN: Kirsten Rodriguez is a 47 y.o. woman with a 17 cm complex pelvic mass and possible postmenopausal bleeding.  We reviewed that the exact etiology of the pelvic mass is unclear, but could include a benign, borderline, or malignant process.  The recommended treatment is surgical excision to make a definitive diagnosis.  Her CA125 and CEA from the emergency department were within normal limits.  Given her age I recommend additional tumor markers for germ cell and sex cord stromal tumors.  Labs ordered  Additionally, given patient's history of liver mass, redemonstrated on the recent CT, previously, recommend an MRI abdomen and pelvis to better characterize the liver mass and the pelvic mass.  Prior MRI abdomen was suggestive of hemangioma in 2019, but given some additional findings the recommendation was for follow-up MRI abdomen that did not occur.  Also, it is unclear if the patient is postmenopausal at this time but she has gone several years without a period with recent light bleeding noted in the past few months.  Will obtain FSH and LH to help characterize this.  Given possible postmenopausal status with new bleeding and endometrial stripe of 8 mm, recommended endometrial biopsy today.  See procedure note below.  Because the mass is large, at least a mini laparotomy for controlled cyst drainage will be required.  Reviewed that we would plan for at least a unilateral salpingo-oophorectomy.  Additional surgery will depend on her menopausal status and evaluation of her endometrium.  If she is postmenopausal, would plan for bilateral salpingo-oophorectomy.  Pending intraoperative findings, could consider remainder of surgery to be performed robotically versus laparotomy.  In the event of malignancy or borderline  tumor on frozen section, we will perform indicated staging procedures. We discussed that these procedures may include omentectomy pelvic and/or para-aortic lymphadenectomy, peritoneal biopsies. We would also remove any tissue concerning for metastatic disease which could require additional procedures including bowel surgery.  Patient was consented for: Mini laparotomy for controlled cyst drainage, robotic versus open unilateral salpingo-oophorectomy versus bilateral salpingo-oophorectomy, possible hysterectomy, possible staging on 10/12/22.  The risks of surgery were discussed in detail and she understands these to including but not limited to bleeding requiring a blood transfusion, infection, injury to adjacent organs (including but not limited to the bowels, bladder, ureters, nerves, blood vessels), thromboembolic events, wound separation, hernia, vaginal cuff separation, possible risk of lymphedema and lymphocyst if lymphadenectomy performed, unforseen complication, and possible need for re-exploration.  If the patient experiences any of these events, she understands that her hospitalization or recovery may be prolonged and that she may need to take additional medications for a prolonged period. The patient will receive DVT and antibiotic prophylaxis as indicated. She voiced a clear understanding. She had the opportunity to ask questions and informed consent was obtained today. She wishes to proceed.  She will proceed to the lab today for tumor markers, FSH/LH, A1c. We will follow-up with her rheumatologist regarding perioperative timing of her Taltz. She does not require preoperative clearance. Her METs are >4.  All preoperative instructions were reviewed. Postoperative expectations were also reviewed. Written handouts were provided to the patient.   A copy of this note was sent to the patient's referring provider.  Clide Cliff, MD Gynecologic Oncology   Medical Decision Making I  personally spent  TOTAL 60 minutes face-to-face and non-face-to-face in the care of  this patient, which includes all pre, intra, and post visit time on the date of service.   ------------  CC: Pelvic mass  HISTORY OF PRESENT ILLNESS:  Kirsten Rodriguez is a 46 y.o. woman who is seen in consultation at the request of Ernie Avena, MD for evaluation of pelvic mass.  Patient presented to the emergency department on 09/06/2022 for right-sided flank pain, nausea, and vomiting this pain had sudden onset the night prior.  Pain radiated to her right lower quadrant.  Her pain improved when the medication.  She underwent a CT renal stone study which noted a 14.2 x 16.2 x 17.3 predominantly cystic pelvic mass inseparable from the right ovary in the fundus of the uterus.  The mass has multiple mural nodules and hyperattenuating septations.  Scan also demonstrated multiple indeterminate retroperitoneal lymph nodes less than 1 cm.  A pelvic ultrasound was also performed which showed a large cystic and solid right adnexal mass.  Tumor markers were collected with a CA125 of 18.8 and a CEA of 1.9.  Today patient presents with her husband.  She endorses the history as above.  She reports on and off pain since the emergency department but not as severe as then.  She takes occasional oxycodone about 1 to 2 pills a day which helps treat her pain.  She has also been experiencing some nausea.  She otherwise denies abdominal bloating, early satiety, significant weight loss, change in bladder habits.  She notes a few days of constipation but that is since resolved.  Of note, she also reports that she ceased having menses at age 45.  However in the past few months she has noticed some red blood on the toilet paper when she is after voiding.  Patient's past medical history is notable for diabetes and psoriatic arthritis.  She follows with Dr. Azucena Fallen at [facility] for her psoriatic arthritis.  She is on  monthly Taltz injections.  She follows with Myles Lipps, DO at [facility] for her diabetes.  Patient is unsure of her last A1c but believes it may have been around 6.  She checks her sugar in the mornings and it ranges from 1 19-1 40.  Patient has an unknown family history in the setting of her being adopted.   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  PAST MEDICAL HISTORY: Past Medical History:  Diagnosis Date   Diabetes mellitus without complication (HCC)    Hypertension    Psoriatic arthritis (HCC)     PAST SURGICAL HISTORY: Past Surgical History:  Procedure Laterality Date   BACK SURGERY     L-spine   KNEE SURGERY Right     OB/GYN HISTORY: OB History  Gravida Para Term Preterm AB Living  3 2 2  0 1 2  SAB IAB Ectopic Multiple Live Births  1 0 0 0 2    # Outcome Date GA Lbr Len/2nd Weight Sex Type Anes PTL Lv  3 SAB           2 Term      Vag-Spont   LIV  1 Term      Vag-Spont         Age at menarche: 53 Age at menopause: 46 Hx of HRT: None, OCP use earlier in life for birth control Hx of STI: No Last pap: Thinks 2020 with PCP, normal per pt report History of abnormal pap smears: none  SCREENING STUDIES:  Last mammogram: 06/2022 Last colonoscopy: None  MEDICATIONS:  Current Outpatient Medications:  clobetasol cream (TEMOVATE) 0.05 %, Apply 1 Application topically 2 (two) times daily for 14 days, Disp: 30 g, Rfl: 1   empagliflozin (JARDIANCE) 10 MG TABS tablet, Take 1 tablet (10 mg total) by mouth every morning with or without food, Disp: 90 tablet, Rfl: 1   glucose blood (FREESTYLE LITE) test strip, Use to check blood sugar 3-4 times a day, Disp: 100 strip, Rfl: 3   guaiFENesin-codeine 100-10 MG/5ML syrup, Take 5-10 mLs by mouth every 4-6 hours as needed for 5 days, Disp: 240 mL, Rfl: 0   Ixekizumab (TALTZ) 80 MG/ML SOSY, Inject 1 ml Subcutaneous Every 4 weeks 30 days, Disp: 1 mL, Rfl: 5   Lancets (FREESTYLE) lancets, Use to test blood sugar 3 - 4 times a day,  Disp: 100 each, Rfl: 3   losartan (COZAAR) 100 MG tablet, 2 tablets, Disp: , Rfl:    losartan-hydrochlorothiazide (HYZAAR) 50-12.5 MG tablet, Take 1 tablet by mouth daily., Disp: 90 tablet, Rfl: 3   metFORMIN (GLUCOPHAGE) 1000 MG tablet, Take 1 tablet by mouth 2 times daily with a meal., Disp: 180 tablet, Rfl: 2   oxyCODONE-acetaminophen (PERCOCET/ROXICET) 5-325 MG tablet, Take by mouth every 6 (six) hours as needed for severe pain. As needed for right side pain, Disp: , Rfl:    rosuvastatin (CRESTOR) 5 MG tablet, Take 1 tablet (5 mg total) by mouth 3 (three) times a week., Disp: 36 tablet, Rfl: 1   Semaglutide, 2 MG/DOSE, (OZEMPIC, 2 MG/DOSE,) 8 MG/3ML SOPN, Inject 2mg  under the skin once a week as directed., Disp: 9 mL, Rfl: 3   sertraline (ZOLOFT) 50 MG tablet, Take 50 mg by mouth daily., Disp: , Rfl:    SUMAtriptan (IMITREX) 100 MG tablet, Take 1 tablet by mouth at onset of headach. ,Maybe repeat in 2 hours if still having migraine. Max daily dose of 200mg  per day., Disp: 27 tablet, Rfl: 1  ALLERGIES: No Known Allergies  FAMILY HISTORY: Family History  Adopted: Yes    SOCIAL HISTORY: Social History   Socioeconomic History   Marital status: Married    Spouse name: Not on file   Number of children: Not on file   Years of education: Not on file   Highest education level: Not on file  Occupational History   Not on file  Tobacco Use   Smoking status: Former    Types: Cigarettes   Smokeless tobacco: Former   Tobacco comments:    Socially in Designer, multimedia   Vaping status: Former  Substance and Sexual Activity   Alcohol use: Yes    Comment: 5-6 per week    Drug use: No   Sexual activity: Yes  Other Topics Concern   Not on file  Social History Narrative   Not on file   Social Determinants of Health   Financial Resource Strain: Not on file  Food Insecurity: Not on file  Transportation Needs: Not on file  Physical Activity: Not on file  Stress: Not on file  Social  Connections: Unknown (07/02/2022)   Received from [facility]   Social Network    Social Network: Not on file  Intimate Partner Violence: Unknown (07/02/2022)   Received from [facility]   HITS    Physically Hurt: Not on file    Insult or Talk Down To: Not on file    Threaten Physical Harm: Not on file    Scream or Curse: Not on file    REVIEW OF SYSTEMS: New patient intake form was  reviewed.  Complete 10-system review is negative except for the following: Appetite changes, blood in urine, fatigue, abdominal pain, vaginal bleeding  PHYSICAL EXAM: BP 104/66 (BP Location: Right Arm, Patient Position: Sitting)   Pulse 78   Temp 98 F (36.7 C) (Oral)   Resp 16   Ht 5\' 9"  (1.753 m)   Wt 252 lb (114.3 kg)   SpO2 96%   BMI 37.21 kg/m  Constitutional: No acute distress. Neuro/Psych: Alert, oriented.  Head and Neck: Normocephalic, atraumatic. Neck symmetric without masses. Sclera anicteric.  Respiratory: Normal work of breathing. Clear to auscultation bilaterally. Cardiovascular: Regular rate and rhythm, no murmurs, rubs, or gallops. Abdomen: Normoactive bowel sounds. Soft, non-distended.  Mass palpated in abdomen that is somewhat mobile. Extremities: Grossly normal range of motion. Warm, well perfused. No edema bilaterally. Skin: No rashes or lesions. Lymphatic: No cervical, supraclavicular, or inguinal adenopathy. Genitourinary: External genitalia without lesions. Urethral meatus without lesions or prolapse. On speculum exam, vagina and cervix without lesions. Bimanual exam reveals anteverted mobile uterus.  Mass palpated cephalad to the uterus. Exam chaperoned by Warner Mccreedy, NP   EMB Procedure: After appropriate verbal informed consent was obtained, a timeout was performed. A sterile speculum was placed in the vagina, and the area was cleaned with betadine x3. A single-tooth tenaculum was placed on the anterior lip of the cervix. An endometrial biopsy pipelle was advanced  carefully to the uterine fundus which sounded to 10cm. An adequate sample was obtained over 1 pass. The tenaculum was removed, and tenaculum sites were noted to be hemostatic. The speculum was removed from the vagina. The patient tolerated the procedure well.   UPT: negative   LABORATORY AND RADIOLOGIC DATA: Outside medical records were reviewed to synthesize the above history, along with the history and physical obtained during the visit.  Outside laboratory and imaging reports were reviewed, with pertinent results below.  I personally reviewed the outside images.  WBC  Date Value Ref Range Status  09/06/2022 9.1 4.0 - 10.5 K/uL Final   Hemoglobin  Date Value Ref Range Status  09/06/2022 15.4 (H) 12.0 - 15.0 g/dL Final   HCT  Date Value Ref Range Status  09/06/2022 45.1 36.0 - 46.0 % Final   Platelets  Date Value Ref Range Status  09/06/2022 121 (L) 150 - 400 K/uL Final   Creatinine, Ser  Date Value Ref Range Status  09/06/2022 0.52 0.44 - 1.00 mg/dL Final   AST  Date Value Ref Range Status  09/06/2022 29 15 - 41 U/L Final   ALT  Date Value Ref Range Status  09/06/2022 27 0 - 44 U/L Final   Cancer Antigen (CA) 125  Date Value Ref Range Status  09/06/2022 18.8 0.0 - 38.1 U/mL Final    Comment:    (NOTE) Roche [employer]ostics Electrochemiluminescence Immunoassay (ECLIA) Values obtained with different assay methods or kits cannot be used interchangeably.  Results cannot be interpreted as absolute evidence of the presence or absence of malignant disease. Performed At: [facility] [address] [place], [place] [id number] Jolene Schimke MD ZH:[id number]    Lab Results  Component Value Date   EXB284 18.8 09/06/2022   CEA1 1.9 09/06/2022     US PELVIC COMPLETE W TRANSVAGINAL AND TORSION R/O 09/06/2022  Narrative CLINICAL DATA:  Ovarian mass.  EXAM: TRANSABDOMINAL AND TRANSVAGINAL ULTRASOUND OF PELVIS  DOPPLER ULTRASOUND OF OVARIES  TECHNIQUE: Both  transabdominal and transvaginal ultrasound examinations of the pelvis were performed. Transabdominal technique was performed for global imaging  of the pelvis including uterus, ovaries, adnexal regions, and pelvic cul-de-sac.  It was necessary to proceed with endovaginal exam following the transabdominal exam to visualize the ovaries. Color and duplex Doppler ultrasound was utilized to evaluate blood flow to the ovaries.  COMPARISON:  CT of the abdomen September 06, 2022  FINDINGS: Uterus  Measurements: 11.0 x 6.2 x 6.8 cm = volume: 241 mL. There is a 2.1 x 2.0 x 1.9 cm intramural myometrial mass within the right anterior fundus.  Endometrium  Thickness: 8 mm.  No focal abnormality visualized.  Right ovary  Measurements: There is a large complex cystic and solid mass containing thick vascular septations in the right adnexa. The right ovary is inseparable from the mass. The mass measures 16.6 x 12.9 x 13.0 cm. The solid component measures at least 12 cm.  Left ovary  Measurements: 3.1 x 2.2 x 2.0 cm = volume: 7.1 mL. Normal appearance/no adnexal mass.  Pulsed Doppler evaluation of the left ovary demonstrates normal low-resistance arterial and venous waveforms.  Other findings  No abnormal free fluid.  IMPRESSION: Large cystic and solid right adnexal mass, suspicious for primary ovarian malignancy. The right ovary is inseparable from the mass. Recommend surgical consultation.  Single myometrial mass within the fundus of the uterus may represent a fibroid.  Normal left ovary.   Electronically Signed By: Ted Mcalpine M.D. On: 09/06/2022 11:58   CT RENAL STONE STUDY 09/06/2022  Narrative CLINICAL DATA:  Abdominal/flank pain, stone suspected  EXAM: CT ABDOMEN AND PELVIS WITHOUT CONTRAST  TECHNIQUE: Multidetector CT imaging of the abdomen and pelvis was performed following the standard protocol without IV contrast.  RADIATION DOSE REDUCTION: This exam  was performed according to the departmental dose-optimization program which includes automated exposure control, adjustment of the mA and/or kV according to patient size and/or use of iterative reconstruction technique.  COMPARISON:  CT scan abdomen and pelvis from 01/03/2018.  FINDINGS: Lower chest: The lung bases are clear. No pleural effusion. The heart is normal in size. No pericardial effusion.  Hepatobiliary: The liver is normal in size. There is subtle liver surface irregularity/nodularity, concerning for cirrhotic configuration. There is an ill-defined, hypoattenuating, subcapsular lesion in the right hepatic lobe, segment 7 measuring approximately 3.5 x 3.7 cm, better characterized on the prior MRI from 01/06/2018. No intrahepatic or extrahepatic bile duct dilation. There is a peripherally rim calcified 2.1 x 2.4 cm gallstone. Normal gallbladder wall thickness. No pericholecystic inflammatory changes.  Pancreas: Unremarkable. No pancreatic ductal dilatation or surrounding inflammatory changes.  Spleen: Spleen is enlarged measuring upto 10.7 x 16.9 cm orthogonally on coronal plane. No focal lesion.  Adrenals/Urinary Tract: Adrenal glands are unremarkable. No suspicious renal mass. No hydronephrosis. No renal or ureteric calculi. Urinary bladder is under distended, precluding optimal assessment. However, no large mass or stones identified. No perivesical fat stranding.  Stomach/Bowel: There is a small sliding hiatal hernia. No disproportionate dilation of the small or large bowel loops. No evidence of abnormal bowel wall thickening or inflammatory changes. The appendix is unremarkable.  Vascular/Lymphatic: No ascites or pneumoperitoneum. There are multiple indeterminate retroperitoneal lymph nodes with short axis less than 1 cm. No aneurysmal dilation of the major abdominal arteries.  Reproductive: Normal-sized anteverted uterus. There is an approximately 3.7 x 5.1  cm lobulation along the right fundal region of the uterus, which may represent an underlying leiomyoma. Left ovary is unremarkable. Adjacent to and inseparable from the right ovary and the fundus of the uterus is a large predominantly cystic  mass measuring approximately 14.2 x 16.2 x 17.3 cm (anteroposterior x transverse x craniocaudal) which exhibits multiple intervening mural nodules/hyperattenuating septations. This is indeterminate but concerning for neoplastic process arising from the right ovary or uterus. OBGYN consultation and contrast-enhanced MRI pelvis is recommended.  Other: The visualized soft tissues and abdominal wall are unremarkable.  Musculoskeletal: No suspicious osseous lesions. There are mild multilevel degenerative changes in the visualized spine.  IMPRESSION: 1. No obstructive uropathy. No nephrolithiasis. No acute inflammatory process identified within the abdomen or pelvis. 2. Large predominantly cystic mass adjacent to and inseparable from the right ovary and the fundus of the uterus measuring approximately 14.2 x 16.2 x 17.3 cm (AP x TR x CC) which exhibits multiple intervening mural nodules/hyperattenuating septations. This is indeterminate but concerning for neoplastic process arising from the right ovary or uterus. OBGYN consultation and contrast-enhanced MRI pelvis is recommended. 3. Grossly stable, ill-defined hypoattenuating right hepatic lobe segment 7 lesion, better evaluated on the prior MRI from 2019. 4. Multiple indeterminate retroperitoneal lymph nodes with short axis less than 1 cm. 5. Multiple other nonacute observations, as described above.   Electronically Signed By: Jules Schick M.D. On: 09/06/2022 10:33

## 2022-09-13 NOTE — Telephone Encounter (Addendum)
Per Dr Alvester Morin fax records and surgical optimization form to Dr Roxy Cedar office her rheumatology

## 2022-09-13 NOTE — Patient Instructions (Signed)
Plan on having an MRI before surgery and you will be contacted with the results. We will also check lab work today.   We will reach out to your rheumatologist about your medications before and after surgery.   YOU WILL NEED TO HOLD YOUR OZEMPIC 7 DAYS BEFORE SURGERY. YOU WILL NEED TO HOLD YOUR JARDIANCE 3 DAYS BEFORE SURGERY.   Preparing for your Surgery   Plan for surgery on October 12, 2022 with Dr. Clide Cliff at [facility]. You will be scheduled for mini laparotomy for cyst drainage (slightly larger incision to drain the cyst in a controlled fashion), robotic assisted laparoscopic vs open unilateral versus bilateral salpingo-oophorectomy, possible total hysterectomy (removal of the uterus and cervix), possible staging if a precancer or cancer is seen.    Pre-operative Testing -You will receive a phone call from presurgical testing at [facility] to arrange for a pre-operative appointment and lab work.   -Bring your insurance card, copy of an advanced directive if applicable, medication list   -At that visit, you will be asked to sign a consent for a possible blood transfusion in case a transfusion becomes necessary during surgery.  The need for a blood transfusion is rare but having consent is a necessary part of your care.      -You should not be taking blood thinners or aspirin at least ten days prior to surgery unless instructed by your surgeon.   -Do not take supplements such as fish oil (omega 3), red yeast rice, turmeric before your surgery. You want to avoid medications with aspirin in them including headache powders such as BC or Goody's), Excedrin migraine.   Day Before Surgery at Home -You will be asked to take in a light diet the day before surgery. You will be advised you can have clear liquids up until 3 hours before your surgery.     Eat a light diet the day before surgery.  Examples including soups, broths, toast, yogurt, mashed potatoes.  AVOID GAS  PRODUCING FOODS AND BEVERAGES. Things to avoid include carbonated beverages (fizzy beverages, sodas), raw fruits and raw vegetables (uncooked), or beans.    If your bowels are filled with gas, your surgeon will have difficulty visualizing your pelvic organs which increases your surgical risks.   Your role in recovery Your role is to become active as soon as directed by your doctor, while still giving yourself time to heal.  Rest when you feel tired. You will be asked to do the following in order to speed your recovery:   - Cough and breathe deeply. This helps to clear and expand your lungs and can prevent pneumonia after surgery.  - STAY ACTIVE WHEN YOU GET HOME. Do mild physical activity. Walking or moving your legs help your circulation and body functions return to normal. Do not try to get up or walk alone the first time after surgery.   -If you develop swelling on one leg or the other, pain in the back of your leg, redness/warmth in one of your legs, please call the office or go to the Emergency Room to have a doppler to rule out a blood clot. For shortness of breath, chest pain-seek care in the Emergency Room as soon as possible. - Actively manage your pain. Managing your pain lets you move in comfort. We will ask you to rate your pain on a scale of zero to 10. It is your responsibility to tell your doctor or nurse where and how much  you hurt so your pain can be treated.   Special Considerations -If you are diabetic, you may be placed on insulin after surgery to have closer control over your blood sugars to promote healing and recovery.  This does not mean that you will be discharged on insulin.  If applicable, your oral antidiabetics will be resumed when you are tolerating a solid diet.   -Your final pathology results from surgery should be available around one week after surgery and the results will be relayed to you when available.   -Dr.  is the surgeon that assists your GYN Oncologist with  surgery.  If you end up staying the night, the next day after your surgery you will either see Dr. Pricilla Holm, Dr. Alvester Morin, or Dr. Antionette Char.   -FMLA forms can be faxed to [phone] and please allow 5-7 business days for completion.   Pain Management After Surgery -You will be prescribed your pain medication and bowel regimen medications right before surgery so that you can have these available when you are discharged from the hospital. The pain medication is for use ONLY AFTER surgery and a new prescription will not be given.    -Make sure that you have Tylenol and Ibuprofen IF YOU ARE ABLE TO TAKE THESE MEDICATIONS at home to use on a regular basis after surgery for pain control. We recommend alternating the medications every hour to six hours since they work differently and are processed in the body differently for pain relief.   -Review the attached handout on narcotic use and their risks and side effects.    Bowel Regimen -You will be prescribed Sennakot-S to take nightly to prevent constipation especially if you are taking the narcotic pain medication intermittently.  It is important to prevent constipation and drink adequate amounts of liquids. You can stop taking this medication when you are not taking pain medication and you are back on your normal bowel routine.   Risks of Surgery Risks of surgery are low but include bleeding, infection, damage to surrounding structures, re-operation, blood clots, and very rarely death.     Blood Transfusion Information (For the consent to be signed before surgery)   We will be checking your blood type before surgery so in case of emergencies, we will know what type of blood you would need.                                             WHAT IS A BLOOD TRANSFUSION?   A transfusion is the replacement of blood or some of its parts. Blood is made up of multiple cells which provide different functions. Red blood cells carry oxygen and are used for  blood loss replacement. White blood cells fight against infection. Platelets control bleeding. Plasma helps clot blood. Other blood products are available for specialized needs, such as hemophilia or other clotting disorders. BEFORE THE TRANSFUSION  Who gives blood for transfusions?  You may be able to donate blood to be used at a later date on yourself (autologous donation). Relatives can be asked to donate blood. This is generally not any safer than if you have received blood from a stranger. The same precautions are taken to ensure safety when a relative's blood is donated. Healthy volunteers who are fully evaluated to make sure their blood is safe. This is blood bank blood. Transfusion therapy is the safest  it has ever been in the practice of medicine. Before blood is taken from a donor, a complete history is taken to make sure that person has no history of diseases nor engages in risky social behavior (examples are intravenous drug use or sexual activity with multiple partners). The donor's travel history is screened to minimize risk of transmitting infections, such as malaria. The donated blood is tested for signs of infectious diseases, such as HIV and hepatitis. The blood is then tested to be sure it is compatible with you in order to minimize the chance of a transfusion reaction. If you or a relative donates blood, this is often done in anticipation of surgery and is not appropriate for emergency situations. It takes many days to process the donated blood. RISKS AND COMPLICATIONS Although transfusion therapy is very safe and saves many lives, the main dangers of transfusion include:  Getting an infectious disease. Developing a transfusion reaction. This is an allergic reaction to something in the blood you were given. Every precaution is taken to prevent this. The decision to have a blood transfusion has been considered carefully by your caregiver before blood is given. Blood is not given  unless the benefits outweigh the risks.   AFTER SURGERY INSTRUCTIONS   Return to work: 4-6 weeks if applicable   Activity: 1. Be up and out of the bed during the day.  Take a nap if needed.  You may walk up steps but be careful and use the hand rail.  Stair climbing will tire you more than you think, you may need to stop part way and rest.    2. No lifting or straining for 6 weeks over 10 pounds. No pushing, pulling, straining for 6 weeks.   3. No driving for around 1 week(s).  Do not drive if you are taking narcotic pain medicine and make sure that your reaction time has returned.    4. You can shower as soon as the next day after surgery. Shower daily.  Use your regular soap and water (not directly on the incision) and pat your incision(s) dry afterwards; don't rub.  No tub baths or submerging your body in water until cleared by your surgeon. If you have the soap that was given to you by pre-surgical testing that was used before surgery, you do not need to use it afterwards because this can irritate your incisions.    5. No sexual activity and nothing in the vagina for 6 weeks, 12 weeks if you have a hysterectomy.   6. You may experience a small amount of clear drainage from your incisions, which is normal.  If the drainage persists, increases, or changes color please call the office.   7. Do not use creams, lotions, or ointments such as neosporin on your incisions after surgery until advised by your surgeon because they can cause removal of the dermabond glue on your incisions.     8. You may experience vaginal spotting after surgery or around the 6-8 week mark from surgery when the stitches at the top of the vagina begin to dissolve.  The spotting is normal but if you experience heavy bleeding, call our office.   9. Take Tylenol or ibuprofen first for pain if you are able to take these medications and only use narcotic pain medication for severe pain not relieved by the Tylenol or  Ibuprofen.  Monitor your Tylenol intake to a max of 4,000 mg in a 24 hour period. You can alternate these medications  after surgery.   Diet: 1. Low sodium Heart Healthy Diet is recommended but you are cleared to resume your normal (before surgery) diet after your procedure.   2. It is safe to use a laxative, such as Miralax or Colace, if you have difficulty moving your bowels. You have been prescribed Sennakot-S to take at bedtime every evening after surgery to keep bowel movements regular and to prevent constipation.     Wound Care: 1. Keep clean and dry.  Shower daily.   Reasons to call the Doctor: Fever - Oral temperature greater than 100.4 degrees Fahrenheit Foul-smelling vaginal discharge Difficulty urinating Nausea and vomiting Increased pain at the site of the incision that is unrelieved with pain medicine. Difficulty breathing with or without chest pain New calf pain especially if only on one side Sudden, continuing increased vaginal bleeding with or without clots.   Contacts: For questions or concerns you should contact:   Dr. Clide Cliff at [phone]   Warner Mccreedy, NP at [phone]   After Hours: call [phone] and have the GYN Oncologist paged/contacted (after 5 pm or on the weekends). You will speak with an after hours RN and let he or she know you have had surgery.   Messages sent via mychart are for non-urgent matters and are not responded to after hours so for urgent needs, please call the after hours number.

## 2022-09-14 ENCOUNTER — Telehealth: Payer: Self-pay

## 2022-09-14 ENCOUNTER — Other Ambulatory Visit (HOSPITAL_COMMUNITY): Payer: Self-pay

## 2022-09-14 LAB — INHIBIN B: Inhibin B: 963.6 pg/mL

## 2022-09-14 LAB — FOLLICLE STIMULATING HORMONE: FSH: <0.3 m[IU]/mL

## 2022-09-14 LAB — AFP TUMOR MARKER: AFP, Serum, Tumor Marker: 5.1 ng/mL (ref 0.0–6.4)

## 2022-09-14 LAB — LUTEINIZING HORMONE: LH: 2.2 m[IU]/mL

## 2022-09-14 LAB — BETA HCG QUANT (REF LAB): hCG Quant: <1 m[IU]/mL

## 2022-09-14 LAB — INHIBIN A

## 2022-09-14 NOTE — Telephone Encounter (Signed)
Heather from [facility] lab called stating pt had labs drawn yesterday 7/22 and the Inhibin A did not get processed due to it was supposed to be frozen and was not. It is now labeled as improper specimen.   Warner Mccreedy APP notified and will ask Dr.Newton if lab can be drawn with pre-admit labs. Pt is scheduled for surgery on 8/20 with Dr.Newton.

## 2022-09-15 LAB — SURGICAL PATHOLOGY

## 2022-09-16 ENCOUNTER — Other Ambulatory Visit: Payer: Self-pay

## 2022-09-16 ENCOUNTER — Other Ambulatory Visit: Payer: Self-pay | Admitting: Gynecologic Oncology

## 2022-09-16 DIAGNOSIS — N838 Other noninflammatory disorders of ovary, fallopian tube and broad ligament: Secondary | ICD-10-CM

## 2022-09-16 NOTE — Progress Notes (Signed)
Patient here for new patient consultation with Dr. Alvester Morin and for a pre-operative appointment prior to her scheduled surgery on October 12, 2022. She is scheduled for mini laparotomy for cyst drainage, robotic assisted laparoscopic vs open unilateral versus bilateral salpingo-oophorectomy, possible total hysterectomy, possible staging. The surgery was discussed in detail.  See after visit summary for additional details.    Discussed post-op pain management in detail including the aspects of the enhanced recovery pathway.  Advised her medications would be sent in closer to the surgery date. We discussed the use of tylenol post-op and to monitor for a maximum of 4,000 mg in a 24 hour period.  Also plan to prescribe sennakot to be used after surgery and to hold if having loose stools.  Discussed bowel regimen in detail.     Discussed measures to take at home to prevent DVT including frequent mobility.  Reportable signs and symptoms of DVT discussed. Post-operative instructions discussed and expectations for after surgery. Incisional care discussed as well including reportable signs and symptoms including erythema, drainage, wound separation.     10 minutes spent with the patient/preparing information.  Verbalizing understanding of material discussed. No needs or concerns voiced at the end of the visit.   Advised patient to call for any needs.    This appointment is included in the global surgical bundle as pre-operative teaching and has no charge.

## 2022-09-16 NOTE — Patient Instructions (Signed)
Plan on having an MRI before surgery and you will be contacted with the results. We will also check lab work today.   We will reach out to your rheumatologist about your medications before and after surgery.   YOU WILL NEED TO HOLD YOUR OZEMPIC 7 DAYS BEFORE SURGERY. YOU WILL NEED TO HOLD YOUR JARDIANCE 3 DAYS BEFORE SURGERY.   Preparing for your Surgery   Plan for surgery on October 12, 2022 with Dr. Clide Cliff at [facility]. You will be scheduled for mini laparotomy for cyst drainage (slightly larger incision to drain the cyst in a controlled fashion), robotic assisted laparoscopic vs open unilateral versus bilateral salpingo-oophorectomy, possible total hysterectomy (removal of the uterus and cervix), possible staging if a precancer or cancer is seen.    Pre-operative Testing -You will receive a phone call from presurgical testing at [facility] to arrange for a pre-operative appointment and lab work.   -Bring your insurance card, copy of an advanced directive if applicable, medication list   -At that visit, you will be asked to sign a consent for a possible blood transfusion in case a transfusion becomes necessary during surgery.  The need for a blood transfusion is rare but having consent is a necessary part of your care.      -You should not be taking blood thinners or aspirin at least ten days prior to surgery unless instructed by your surgeon.   -Do not take supplements such as fish oil (omega 3), red yeast rice, turmeric before your surgery. You want to avoid medications with aspirin in them including headache powders such as BC or Goody's), Excedrin migraine.   Day Before Surgery at Home -You will be asked to take in a light diet the day before surgery. You will be advised you can have clear liquids up until 3 hours before your surgery.     Eat a light diet the day before surgery.  Examples including soups, broths, toast, yogurt, mashed potatoes.  AVOID GAS  PRODUCING FOODS AND BEVERAGES. Things to avoid include carbonated beverages (fizzy beverages, sodas), raw fruits and raw vegetables (uncooked), or beans.    If your bowels are filled with gas, your surgeon will have difficulty visualizing your pelvic organs which increases your surgical risks.   Your role in recovery Your role is to become active as soon as directed by your doctor, while still giving yourself time to heal.  Rest when you feel tired. You will be asked to do the following in order to speed your recovery:   - Cough and breathe deeply. This helps to clear and expand your lungs and can prevent pneumonia after surgery.  - STAY ACTIVE WHEN YOU GET HOME. Do mild physical activity. Walking or moving your legs help your circulation and body functions return to normal. Do not try to get up or walk alone the first time after surgery.   -If you develop swelling on one leg or the other, pain in the back of your leg, redness/warmth in one of your legs, please call the office or go to the Emergency Room to have a doppler to rule out a blood clot. For shortness of breath, chest pain-seek care in the Emergency Room as soon as possible. - Actively manage your pain. Managing your pain lets you move in comfort. We will ask you to rate your pain on a scale of zero to 10. It is your responsibility to tell your doctor or nurse where and how much  you hurt so your pain can be treated.   Special Considerations -If you are diabetic, you may be placed on insulin after surgery to have closer control over your blood sugars to promote healing and recovery.  This does not mean that you will be discharged on insulin.  If applicable, your oral antidiabetics will be resumed when you are tolerating a solid diet.   -Your final pathology results from surgery should be available around one week after surgery and the results will be relayed to you when available.   -Dr.  is the surgeon that assists your GYN Oncologist with  surgery.  If you end up staying the night, the next day after your surgery you will either see Dr. Pricilla Holm, Dr. Alvester Morin, or Dr. Antionette Char.   -FMLA forms can be faxed to [phone] and please allow 5-7 business days for completion.   Pain Management After Surgery -You will be prescribed your pain medication and bowel regimen medications right before surgery so that you can have these available when you are discharged from the hospital. The pain medication is for use ONLY AFTER surgery and a new prescription will not be given.    -Make sure that you have Tylenol and Ibuprofen IF YOU ARE ABLE TO TAKE THESE MEDICATIONS at home to use on a regular basis after surgery for pain control. We recommend alternating the medications every hour to six hours since they work differently and are processed in the body differently for pain relief.   -Review the attached handout on narcotic use and their risks and side effects.    Bowel Regimen -You will be prescribed Sennakot-S to take nightly to prevent constipation especially if you are taking the narcotic pain medication intermittently.  It is important to prevent constipation and drink adequate amounts of liquids. You can stop taking this medication when you are not taking pain medication and you are back on your normal bowel routine.   Risks of Surgery Risks of surgery are low but include bleeding, infection, damage to surrounding structures, re-operation, blood clots, and very rarely death.     Blood Transfusion Information (For the consent to be signed before surgery)   We will be checking your blood type before surgery so in case of emergencies, we will know what type of blood you would need.                                             WHAT IS A BLOOD TRANSFUSION?   A transfusion is the replacement of blood or some of its parts. Blood is made up of multiple cells which provide different functions. Red blood cells carry oxygen and are used for  blood loss replacement. White blood cells fight against infection. Platelets control bleeding. Plasma helps clot blood. Other blood products are available for specialized needs, such as hemophilia or other clotting disorders. BEFORE THE TRANSFUSION  Who gives blood for transfusions?  You may be able to donate blood to be used at a later date on yourself (autologous donation). Relatives can be asked to donate blood. This is generally not any safer than if you have received blood from a stranger. The same precautions are taken to ensure safety when a relative's blood is donated. Healthy volunteers who are fully evaluated to make sure their blood is safe. This is blood bank blood. Transfusion therapy is the safest  it has ever been in the practice of medicine. Before blood is taken from a donor, a complete history is taken to make sure that person has no history of diseases nor engages in risky social behavior (examples are intravenous drug use or sexual activity with multiple partners). The donor's travel history is screened to minimize risk of transmitting infections, such as malaria. The donated blood is tested for signs of infectious diseases, such as HIV and hepatitis. The blood is then tested to be sure it is compatible with you in order to minimize the chance of a transfusion reaction. If you or a relative donates blood, this is often done in anticipation of surgery and is not appropriate for emergency situations. It takes many days to process the donated blood. RISKS AND COMPLICATIONS Although transfusion therapy is very safe and saves many lives, the main dangers of transfusion include:  Getting an infectious disease. Developing a transfusion reaction. This is an allergic reaction to something in the blood you were given. Every precaution is taken to prevent this. The decision to have a blood transfusion has been considered carefully by your caregiver before blood is given. Blood is not given  unless the benefits outweigh the risks.   AFTER SURGERY INSTRUCTIONS   Return to work: 4-6 weeks if applicable   Activity: 1. Be up and out of the bed during the day.  Take a nap if needed.  You may walk up steps but be careful and use the hand rail.  Stair climbing will tire you more than you think, you may need to stop part way and rest.    2. No lifting or straining for 6 weeks over 10 pounds. No pushing, pulling, straining for 6 weeks.   3. No driving for around 1 week(s).  Do not drive if you are taking narcotic pain medicine and make sure that your reaction time has returned.    4. You can shower as soon as the next day after surgery. Shower daily.  Use your regular soap and water (not directly on the incision) and pat your incision(s) dry afterwards; don't rub.  No tub baths or submerging your body in water until cleared by your surgeon. If you have the soap that was given to you by pre-surgical testing that was used before surgery, you do not need to use it afterwards because this can irritate your incisions.    5. No sexual activity and nothing in the vagina for 6 weeks, 12 weeks if you have a hysterectomy.   6. You may experience a small amount of clear drainage from your incisions, which is normal.  If the drainage persists, increases, or changes color please call the office.   7. Do not use creams, lotions, or ointments such as neosporin on your incisions after surgery until advised by your surgeon because they can cause removal of the dermabond glue on your incisions.     8. You may experience vaginal spotting after surgery or around the 6-8 week mark from surgery when the stitches at the top of the vagina begin to dissolve.  The spotting is normal but if you experience heavy bleeding, call our office.   9. Take Tylenol or ibuprofen first for pain if you are able to take these medications and only use narcotic pain medication for severe pain not relieved by the Tylenol or  Ibuprofen.  Monitor your Tylenol intake to a max of 4,000 mg in a 24 hour period. You can alternate these medications  after surgery.   Diet: 1. Low sodium Heart Healthy Diet is recommended but you are cleared to resume your normal (before surgery) diet after your procedure.   2. It is safe to use a laxative, such as Miralax or Colace, if you have difficulty moving your bowels. You have been prescribed Sennakot-S to take at bedtime every evening after surgery to keep bowel movements regular and to prevent constipation.     Wound Care: 1. Keep clean and dry.  Shower daily.   Reasons to call the Doctor: Fever - Oral temperature greater than 100.4 degrees Fahrenheit Foul-smelling vaginal discharge Difficulty urinating Nausea and vomiting Increased pain at the site of the incision that is unrelieved with pain medicine. Difficulty breathing with or without chest pain New calf pain especially if only on one side Sudden, continuing increased vaginal bleeding with or without clots.   Contacts: For questions or concerns you should contact:   Dr. Clide Cliff at [phone]   Warner Mccreedy, NP at [phone]   After Hours: call [phone] and have the GYN Oncologist paged/contacted (after 5 pm or on the weekends). You will speak with an after hours RN and let he or she know you have had surgery.   Messages sent via mychart are for non-urgent matters and are not responded to after hours so for urgent needs, please call the after hours number.

## 2022-09-23 ENCOUNTER — Encounter: Payer: Self-pay | Admitting: Psychiatry

## 2022-09-24 ENCOUNTER — Ambulatory Visit (HOSPITAL_COMMUNITY)
Admission: RE | Admit: 2022-09-24 | Discharge: 2022-09-24 | Disposition: A | Discharge status: Home / Self Care | Payer: Commercial Managed Care - PPO | Source: Ambulatory Visit | Attending: Psychiatry | Admitting: Psychiatry

## 2022-09-24 ENCOUNTER — Telehealth: Payer: Self-pay | Admitting: Pharmacist

## 2022-09-24 DIAGNOSIS — R160 Hepatomegaly, not elsewhere classified: Secondary | ICD-10-CM | POA: Insufficient documentation

## 2022-09-24 DIAGNOSIS — N838 Other noninflammatory disorders of ovary, fallopian tube and broad ligament: Secondary | ICD-10-CM

## 2022-09-24 DIAGNOSIS — K8020 Calculus of gallbladder without cholecystitis without obstruction: Secondary | ICD-10-CM | POA: Diagnosis not present

## 2022-09-24 DIAGNOSIS — R161 Splenomegaly, not elsewhere classified: Secondary | ICD-10-CM | POA: Diagnosis not present

## 2022-09-24 DIAGNOSIS — K769 Liver disease, unspecified: Secondary | ICD-10-CM | POA: Diagnosis not present

## 2022-09-24 DIAGNOSIS — K7460 Unspecified cirrhosis of liver: Secondary | ICD-10-CM | POA: Diagnosis not present

## 2022-09-24 MED ORDER — GADOBUTROL 1 MMOL/ML IV SOLN
10.0000 mL | Freq: Once | INTRAVENOUS | Status: AC | PRN
  Administered 2022-09-24: 10 mL via INTRAVENOUS

## 2022-09-24 NOTE — Telephone Encounter (Signed)
Called patient to schedule an appointment for the [employer] [facility]. I was unable to reach the patient so I left a HIPAA-compliant message requesting that the patient return my call.   Luke Van Ausdall, PharmD, BCACP, CPP Clinical Pharmacist [facility] [phone]  

## 2022-09-28 NOTE — Patient Instructions (Signed)
SURGICAL WAITING ROOM VISITATION  Patients having surgery or a procedure may have no more than 2 support people in the waiting area - these visitors may rotate.    Children under the age of 12 must have an adult with them who is not the patient.  Due to an increase in RSV and influenza rates and associated hospitalizations, children ages 17 and under may not visit patients in [facility] hospitals.  If the patient needs to stay at the hospital during part of their recovery, the visitor guidelines for inpatient rooms apply. Pre-op nurse will coordinate an appropriate time for 1 support person to accompany patient in pre-op.  This support person may not rotate.    Please refer to the [facility] website for the visitor guidelines for Inpatients (after your surgery is over and you are in a regular room).       Your procedure is scheduled on:  10/12/2022    Report to [facility] Main Entrance    Report to admitting at   [phone]   Call this number if you have problems the morning of surgery [phone]   EAT a light diet the day before surgery. Avoid gas producing foods.    Do not eat food :After Midnight.   After Midnight you may have the following liquids until _ 0430_____ AM DAY OF SURGERY  Water Non-Citrus Juices (without pulp, NO RED-Apple, White grape, White cranberry) Black Coffee (NO MILK/CREAM OR CREAMERS, sugar ok)  Clear Tea (NO MILK/CREAM OR CREAMERS, sugar ok) regular and decaf                             Plain Jell-O (NO RED)                                           Fruit ices (not with fruit pulp, NO RED)                                     Popsicles (NO RED)                                                               Sports drinks like Gatorade (NO RED)                          If you have questions, please contact your surgeon's office.      Oral Hygiene is also important to reduce your risk of infection.                                    Remember -  BRUSH YOUR TEETH THE MORNING OF SURGERY WITH YOUR REGULAR TOOTHPASTE  DENTURES WILL BE REMOVED PRIOR TO SURGERY PLEASE DO NOT APPLY "Poly grip" OR ADHESIVES!!!   Do NOT smoke after Midnight   Stop all vitamins and herbal supplements 7 days before surgery. Zoloft         Jardiance - Hld  for 72 hours prior to surgery  Last dose on         Metformin- none day of surgery         OZempic- hold for 7 days prior to surgery Last dose on    Take these medicines the morning of surgery with A SIP OF WATER:   DO NOT TAKE ANY ORAL DIABETIC MEDICATIONS DAY OF YOUR SURGERY  Bring CPAP mask and tubing day of surgery.                              You may not have any metal on your body including hair pins, jewelry, and body piercing             Do not wear make-up, lotions, powders, perfumes/cologne, or deodorant  Do not wear nail polish including gel and S&S, artificial/acrylic nails, or any other type of covering on natural nails including finger and toenails. If you have artificial nails, gel coating, etc. that needs to be removed by a nail salon please have this removed prior to surgery or surgery may need to be canceled/ delayed if the surgeon/ anesthesia feels like they are unable to be safely monitored.   Do not shave  48 hours prior to surgery.               Men may shave face and neck.   Do not bring valuables to the hospital. [facility] IS NOT             RESPONSIBLE   FOR VALUABLES.   Contacts, glasses, dentures or bridgework may not be worn into surgery.   Bring small overnight bag day of surgery.   DO NOT BRING YOUR HOME MEDICATIONS TO THE HOSPITAL. PHARMACY WILL DISPENSE MEDICATIONS LISTED ON YOUR MEDICATION LIST TO YOU DURING YOUR ADMISSION IN THE HOSPITAL!    Patients discharged on the day of surgery will not be allowed to drive home.  Someone NEEDS to stay with you for the first 24 hours after anesthesia.   Special Instructions: Bring a copy of your healthcare power of  attorney and living will documents the day of surgery if you haven't scanned them before.              Please read over the following fact sheets you were given: IF YOU HAVE QUESTIONS ABOUT YOUR PRE-OP INSTRUCTIONS PLEASE CALL [phone]   If you received a COVID test during your pre-op visit  it is requested that you wear a mask when out in public, stay away from anyone that may not be feeling well and notify your surgeon if you develop symptoms. If you test positive for Covid or have been in contact with anyone that has tested positive in the last 10 days please notify you surgeon.     - Preparing for Surgery Before surgery, you can play an important role.  Because skin is not sterile, your skin needs to be as free of germs as possible.  You can reduce the number of germs on your skin by washing with CHG (chlorahexidine gluconate) soap before surgery.  CHG is an antiseptic cleaner which kills germs and bonds with the skin to continue killing germs even after washing. Please DO NOT use if you have an allergy to CHG or antibacterial soaps.  If your skin becomes reddened/irritated stop using the CHG and inform your nurse when you arrive at Short Stay. Do not shave (  including legs and underarms) for at least 48 hours prior to the first CHG shower.  You may shave your face/neck. Please follow these instructions carefully:  1.  Shower with CHG Soap the night before surgery and the  morning of Surgery.  2.  If you choose to wash your hair, wash your hair first as usual with your  normal  shampoo.  3.  After you shampoo, rinse your hair and body thoroughly to remove the  shampoo.                           4.  Use CHG as you would any other liquid soap.  You can apply chg directly  to the skin and wash                       Gently with a scrungie or clean washcloth.  5.  Apply the CHG Soap to your body ONLY FROM THE NECK DOWN.   Do not use on face/ open                           Wound or open  sores. Avoid contact with eyes, ears mouth and genitals (private parts).                       Wash face,  Genitals (private parts) with your normal soap.             6.  Wash thoroughly, paying special attention to the area where your surgery  will be performed.  7.  Thoroughly rinse your body with warm water from the neck down.  8.  DO NOT shower/wash with your normal soap after using and rinsing off  the CHG Soap.                9.  Pat yourself dry with a clean towel.            10.  Wear clean pajamas.            11.  Place clean sheets on your bed the night of your first shower and do not  sleep with pets. Day of Surgery : Do not apply any lotions/deodorants the morning of surgery.  Please wear clean clothes to the hospital/surgery center.  FAILURE TO FOLLOW THESE INSTRUCTIONS MAY RESULT IN THE CANCELLATION OF YOUR SURGERY PATIENT SIGNATURE_________________________________  NURSE SIGNATURE__________________________________  ________________________________________________________________________

## 2022-09-28 NOTE — Progress Notes (Addendum)
Anesthesia Review:  PCP: Rachel Bo at [facility]  Cardiologist : Chest x-ray : 06/25/22 - 2 view  EKG : 10/01/22  Echo : Stress test: Cardiac Cath :  Activity level: can do a flight of stairs without difficutly  Sleep Study/ CPAP : none  Fasting Blood Sugar :      / Checks Blood Sugar -- times a day:   Blood Thinner/ Instructions /Last Dose: ASA / Instructions/ Last Dose :    DM- type 2- checks glucose once daily in am  Hgba1c- 09/13/22- 6.2   Jardiance- Hold for 72 hours prior to surgery-  Last dose on 10/08/2022  Metofrmin- none day of surgery Ozempic- hold for 7 days prior to surgery - Last dose on 10/01/2022    CBC done 10/01/22 with PLTC of 108 routed to DR [facility] and Warner Mccreedy, NP .  CBC has been ordered for am of surgery.

## 2022-09-30 ENCOUNTER — Other Ambulatory Visit (HOSPITAL_COMMUNITY): Payer: Self-pay

## 2022-10-01 ENCOUNTER — Encounter (HOSPITAL_COMMUNITY): Payer: Self-pay

## 2022-10-01 ENCOUNTER — Encounter (HOSPITAL_COMMUNITY): Admission: RE | Admit: 2022-10-01 | Payer: Commercial Managed Care - PPO | Source: Ambulatory Visit

## 2022-10-01 ENCOUNTER — Telehealth: Payer: Self-pay | Admitting: *Deleted

## 2022-10-01 ENCOUNTER — Other Ambulatory Visit (HOSPITAL_COMMUNITY): Payer: Self-pay

## 2022-10-01 ENCOUNTER — Other Ambulatory Visit: Payer: Self-pay

## 2022-10-01 VITALS — BP 134/85 | HR 83 | Temp 98.2°F | Resp 16 | Ht 69.0 in | Wt 211.0 lb

## 2022-10-01 DIAGNOSIS — R1900 Intra-abdominal and pelvic swelling, mass and lump, unspecified site: Secondary | ICD-10-CM

## 2022-10-01 DIAGNOSIS — R9431 Abnormal electrocardiogram [ECG] [EKG]: Secondary | ICD-10-CM | POA: Diagnosis not present

## 2022-10-01 DIAGNOSIS — Z01818 Encounter for other preprocedural examination: Secondary | ICD-10-CM | POA: Diagnosis not present

## 2022-10-01 HISTORY — DX: Headache, unspecified: R51.9

## 2022-10-01 HISTORY — DX: Other specified postprocedural states: Z98.890

## 2022-10-01 HISTORY — DX: Other specified postprocedural states: R11.2

## 2022-10-01 LAB — CBC
HCT: 44 % (ref 36.0–46.0)
Hemoglobin: 14.6 g/dL (ref 12.0–15.0)
MCH: 28.7 pg (ref 26.0–34.0)
MCHC: 33.2 g/dL (ref 30.0–36.0)
MCV: 86.4 fL (ref 80.0–100.0)
Platelets: 108 10*3/uL — ABNORMAL LOW (ref 150–400)
RBC: 5.09 MIL/uL (ref 3.87–5.11)
RDW: 13 % (ref 11.5–15.5)
WBC: 6.6 10*3/uL (ref 4.0–10.5)
nRBC: 0 % (ref 0.0–0.2)

## 2022-10-01 LAB — COMPREHENSIVE METABOLIC PANEL
ALT: 33 U/L (ref 0–44)
AST: 38 U/L (ref 15–41)
Albumin: 4.1 g/dL (ref 3.5–5.0)
Alkaline Phosphatase: 35 U/L — ABNORMAL LOW (ref 38–126)
Anion gap: 9 (ref 5–15)
BUN: 11 mg/dL (ref 6–20)
CO2: 26 mmol/L (ref 22–32)
Calcium: 9.3 mg/dL (ref 8.9–10.3)
Chloride: 101 mmol/L (ref 98–111)
Creatinine, Ser: 0.57 mg/dL (ref 0.44–1.00)
GFR, Estimated: >60 mL/min (ref >60)
Glucose, Bld: 106 mg/dL — ABNORMAL HIGH (ref 70–99)
Potassium: 3.7 mmol/L (ref 3.5–5.1)
Sodium: 136 mmol/L (ref 135–145)
Total Bilirubin: 2.1 mg/dL — ABNORMAL HIGH (ref 0.3–1.2)
Total Protein: 8 g/dL (ref 6.5–8.1)

## 2022-10-01 LAB — TYPE AND SCREEN
ABO/RH(D): O POS
Antibody Screen: NEGATIVE

## 2022-10-01 LAB — GLUCOSE, CAPILLARY: Glucose-Capillary: 104 mg/dL — ABNORMAL HIGH (ref 70–99)

## 2022-10-01 MED ORDER — LOSARTAN POTASSIUM-HCTZ 50-12.5 MG PO TABS
1.0000 | ORAL_TABLET | Freq: Every day | ORAL | 3 refills | Status: DC
  Filled 2022-10-01 – 2022-10-14 (×3): qty 90, 90d supply, fill #0
  Filled 2023-01-11: qty 90, 90d supply, fill #1
  Filled 2023-04-20: qty 90, 90d supply, fill #2
  Filled 2023-07-28: qty 90, 90d supply, fill #3

## 2022-10-01 NOTE — Telephone Encounter (Signed)
CBC results faxed to pt's PCP Dr. Myles Lipps and Pt's Rheumatologist Dr. Azucena Fallen.

## 2022-10-06 ENCOUNTER — Other Ambulatory Visit (HOSPITAL_COMMUNITY): Payer: Self-pay

## 2022-10-09 ENCOUNTER — Other Ambulatory Visit (HOSPITAL_COMMUNITY): Payer: Self-pay

## 2022-10-09 ENCOUNTER — Other Ambulatory Visit: Payer: Self-pay

## 2022-10-11 ENCOUNTER — Other Ambulatory Visit (HOSPITAL_COMMUNITY): Payer: Self-pay

## 2022-10-11 ENCOUNTER — Telehealth: Payer: Self-pay | Admitting: *Deleted

## 2022-10-11 ENCOUNTER — Other Ambulatory Visit: Payer: Self-pay | Admitting: Gynecologic Oncology

## 2022-10-11 DIAGNOSIS — N838 Other noninflammatory disorders of ovary, fallopian tube and broad ligament: Secondary | ICD-10-CM

## 2022-10-11 MED ORDER — SENNOSIDES-DOCUSATE SODIUM 8.6-50 MG PO TABS
2.0000 | ORAL_TABLET | Freq: Every day | ORAL | 0 refills | Status: DC
Start: 2022-10-11 — End: 2022-10-28
  Filled 2022-10-11: qty 30, 15d supply, fill #0

## 2022-10-11 NOTE — Telephone Encounter (Signed)
Spoke with Short Stay at [facility] to confirm patient's orders for CBC & Cancer antigen19-9 to be drawn tomorrow morning prior to Ms. Gene's surgery scheduled for 1015.

## 2022-10-11 NOTE — Telephone Encounter (Signed)
Telephone call to check on pre-operative status.  Patient compliant with pre-operative instructions.  Reinforced nothing to eat after midnight. Clear liquids until 0715. Patient to arrive at 0815.  No questions or concerns voiced.  Instructed to call for any needs. 

## 2022-10-12 ENCOUNTER — Ambulatory Visit (HOSPITAL_COMMUNITY)
Admission: RE | Admit: 2022-10-12 | Discharge: 2022-10-12 | Disposition: A | Discharge status: Home / Self Care | Payer: Commercial Managed Care - PPO | Attending: Psychiatry | Admitting: Psychiatry

## 2022-10-12 ENCOUNTER — Encounter (HOSPITAL_COMMUNITY)
Admission: RE | Disposition: A | Discharge status: Home / Self Care | Payer: Self-pay | Source: Home / Self Care | Attending: Psychiatry

## 2022-10-12 ENCOUNTER — Other Ambulatory Visit: Payer: Self-pay

## 2022-10-12 ENCOUNTER — Ambulatory Visit (HOSPITAL_COMMUNITY): Payer: Commercial Managed Care - PPO | Admitting: Anesthesiology

## 2022-10-12 ENCOUNTER — Encounter (HOSPITAL_COMMUNITY): Payer: Self-pay | Admitting: Psychiatry

## 2022-10-12 ENCOUNTER — Ambulatory Visit (HOSPITAL_BASED_OUTPATIENT_CLINIC_OR_DEPARTMENT_OTHER): Payer: Commercial Managed Care - PPO | Admitting: Anesthesiology

## 2022-10-12 DIAGNOSIS — D3911 Neoplasm of uncertain behavior of right ovary: Secondary | ICD-10-CM

## 2022-10-12 DIAGNOSIS — D270 Benign neoplasm of right ovary: Secondary | ICD-10-CM | POA: Diagnosis not present

## 2022-10-12 DIAGNOSIS — N8301 Follicular cyst of right ovary: Secondary | ICD-10-CM | POA: Diagnosis not present

## 2022-10-12 DIAGNOSIS — Z87891 Personal history of nicotine dependence: Secondary | ICD-10-CM

## 2022-10-12 DIAGNOSIS — R1900 Intra-abdominal and pelvic swelling, mass and lump, unspecified site: Secondary | ICD-10-CM | POA: Diagnosis not present

## 2022-10-12 DIAGNOSIS — E119 Type 2 diabetes mellitus without complications: Secondary | ICD-10-CM | POA: Insufficient documentation

## 2022-10-12 DIAGNOSIS — N838 Other noninflammatory disorders of ovary, fallopian tube and broad ligament: Secondary | ICD-10-CM

## 2022-10-12 DIAGNOSIS — I10 Essential (primary) hypertension: Secondary | ICD-10-CM | POA: Insufficient documentation

## 2022-10-12 DIAGNOSIS — N939 Abnormal uterine and vaginal bleeding, unspecified: Secondary | ICD-10-CM | POA: Insufficient documentation

## 2022-10-12 DIAGNOSIS — D259 Leiomyoma of uterus, unspecified: Secondary | ICD-10-CM | POA: Diagnosis not present

## 2022-10-12 DIAGNOSIS — D696 Thrombocytopenia, unspecified: Secondary | ICD-10-CM

## 2022-10-12 DIAGNOSIS — L4050 Arthropathic psoriasis, unspecified: Secondary | ICD-10-CM | POA: Insufficient documentation

## 2022-10-12 DIAGNOSIS — Z7984 Long term (current) use of oral hypoglycemic drugs: Secondary | ICD-10-CM | POA: Diagnosis not present

## 2022-10-12 DIAGNOSIS — Z7985 Long-term (current) use of injectable non-insulin antidiabetic drugs: Secondary | ICD-10-CM | POA: Insufficient documentation

## 2022-10-12 DIAGNOSIS — Z780 Asymptomatic menopausal state: Secondary | ICD-10-CM | POA: Insufficient documentation

## 2022-10-12 DIAGNOSIS — Z01818 Encounter for other preprocedural examination: Secondary | ICD-10-CM

## 2022-10-12 HISTORY — PX: ROBOTIC ASSISTED SALPINGO OOPHERECTOMY: SHX6082

## 2022-10-12 HISTORY — PX: LAPAROTOMY WITH STAGING: SHX5938

## 2022-10-12 HISTORY — PX: ROBOTIC ASSISTED TOTAL HYSTERECTOMY: SHX6085

## 2022-10-12 HISTORY — PX: OMENTECTOMY: SHX5985

## 2022-10-12 LAB — GLUCOSE, CAPILLARY
Glucose-Capillary: 96 mg/dL (ref 70–99)
Glucose-Capillary: 100 mg/dL — ABNORMAL HIGH (ref 70–99)

## 2022-10-12 LAB — CBC
HCT: 45.1 % (ref 36.0–46.0)
Hemoglobin: 14.9 g/dL (ref 12.0–15.0)
MCH: 28.4 pg (ref 26.0–34.0)
MCHC: 33 g/dL (ref 30.0–36.0)
MCV: 85.9 fL (ref 80.0–100.0)
Platelets: 118 10*3/uL — ABNORMAL LOW (ref 150–400)
RBC: 5.25 MIL/uL — ABNORMAL HIGH (ref 3.87–5.11)
RDW: 13 % (ref 11.5–15.5)
WBC: 6.5 10*3/uL (ref 4.0–10.5)
nRBC: 0 % (ref 0.0–0.2)

## 2022-10-12 LAB — POCT PREGNANCY, URINE: Preg Test, Ur: NEGATIVE

## 2022-10-12 LAB — ABO/RH: ABO/RH(D): O POS

## 2022-10-12 SURGERY — LAPAROTOMY, EXPLORATORY, FOR STAGING
Anesthesia: General

## 2022-10-12 MED ORDER — SUGAMMADEX SODIUM 200 MG/2ML IV SOLN
INTRAVENOUS | Status: DC | PRN
  Administered 2022-10-12: 200 mg via INTRAVENOUS

## 2022-10-12 MED ORDER — KETAMINE HCL 50 MG/5ML IJ SOSY
PREFILLED_SYRINGE | INTRAMUSCULAR | Status: AC
  Filled 2022-10-12: qty 5

## 2022-10-12 MED ORDER — DEXAMETHASONE SODIUM PHOSPHATE 10 MG/ML IJ SOLN
INTRAMUSCULAR | Status: AC
  Filled 2022-10-12: qty 1

## 2022-10-12 MED ORDER — HEMOSTATIC AGENTS (NO CHARGE) OPTIME
TOPICAL | Status: DC | PRN
  Administered 2022-10-12: 1 via TOPICAL

## 2022-10-12 MED ORDER — LIDOCAINE HCL (CARDIAC) PF 100 MG/5ML IV SOSY
PREFILLED_SYRINGE | INTRAVENOUS | Status: DC | PRN
  Administered 2022-10-12: 60 mg via INTRAVENOUS

## 2022-10-12 MED ORDER — GABAPENTIN 300 MG PO CAPS
300.0000 mg | ORAL_CAPSULE | ORAL | Status: AC
  Administered 2022-10-12: 300 mg via ORAL
  Filled 2022-10-12: qty 1

## 2022-10-12 MED ORDER — INSULIN ASPART 100 UNIT/ML IJ SOLN: 0.0000 [IU] | INTRAMUSCULAR | Status: DC | PRN

## 2022-10-12 MED ORDER — HEPARIN SODIUM (PORCINE) 5000 UNIT/ML IJ SOLN
5000.0000 [IU] | INTRAMUSCULAR | Status: AC
  Administered 2022-10-12: 5000 [IU] via SUBCUTANEOUS
  Filled 2022-10-12: qty 1

## 2022-10-12 MED ORDER — FENTANYL CITRATE (PF) 250 MCG/5ML IJ SOLN
INTRAMUSCULAR | Status: AC
  Filled 2022-10-12: qty 5

## 2022-10-12 MED ORDER — PROPOFOL 1000 MG/100ML IV EMUL
INTRAVENOUS | Status: AC
  Filled 2022-10-12: qty 200

## 2022-10-12 MED ORDER — VASOPRESSIN 20 UNIT/ML IV SOLN
INTRAVENOUS | Status: AC
  Filled 2022-10-12: qty 1

## 2022-10-12 MED ORDER — PROPOFOL 500 MG/50ML IV EMUL
INTRAVENOUS | Status: AC
  Filled 2022-10-12: qty 50

## 2022-10-12 MED ORDER — PROPOFOL 500 MG/50ML IV EMUL
INTRAVENOUS | Status: DC | PRN
  Administered 2022-10-12: 150 ug/kg/min via INTRAVENOUS

## 2022-10-12 MED ORDER — ACETAMINOPHEN 500 MG PO TABS
1000.0000 mg | ORAL_TABLET | ORAL | Status: AC
  Administered 2022-10-12: 1000 mg via ORAL
  Filled 2022-10-12: qty 2

## 2022-10-12 MED ORDER — GLYCOPYRROLATE 0.2 MG/ML IJ SOLN
INTRAMUSCULAR | Status: DC | PRN
  Administered 2022-10-12: .2 mg via INTRAVENOUS

## 2022-10-12 MED ORDER — HYDROMORPHONE HCL 1 MG/ML IJ SOLN
0.2500 mg | INTRAMUSCULAR | Status: DC | PRN
  Administered 2022-10-12: 0.5 mg via INTRAVENOUS
  Administered 2022-10-12 (×2): 0.25 mg via INTRAVENOUS

## 2022-10-12 MED ORDER — PROPOFOL 10 MG/ML IV BOLUS
INTRAVENOUS | Status: DC | PRN
Start: 2022-10-12 — End: 2022-10-12
  Administered 2022-10-12: 200 mg via INTRAVENOUS

## 2022-10-12 MED ORDER — LIDOCAINE HCL (PF) 2 % IJ SOLN
INTRAMUSCULAR | Status: AC
  Filled 2022-10-12: qty 10

## 2022-10-12 MED ORDER — BUPIVACAINE HCL 0.25 % IJ SOLN
INTRAMUSCULAR | Status: AC
  Filled 2022-10-12: qty 1

## 2022-10-12 MED ORDER — PHENYLEPHRINE 80 MCG/ML (10ML) SYRINGE FOR IV PUSH (FOR BLOOD PRESSURE SUPPORT)
PREFILLED_SYRINGE | INTRAVENOUS | Status: AC
  Filled 2022-10-12: qty 10

## 2022-10-12 MED ORDER — PROPOFOL 1000 MG/100ML IV EMUL
INTRAVENOUS | Status: AC
  Filled 2022-10-12: qty 100

## 2022-10-12 MED ORDER — ROCURONIUM BROMIDE 100 MG/10ML IV SOLN
INTRAVENOUS | Status: DC | PRN
  Administered 2022-10-12: 20 mg via INTRAVENOUS
  Administered 2022-10-12: 60 mg via INTRAVENOUS
  Administered 2022-10-12: 20 mg via INTRAVENOUS
  Administered 2022-10-12: 10 mg via INTRAVENOUS
  Administered 2022-10-12: 40 mg via INTRAVENOUS
  Administered 2022-10-12: 10 mg via INTRAVENOUS

## 2022-10-12 MED ORDER — LIDOCAINE HCL (PF) 2 % IJ SOLN
INTRAMUSCULAR | Status: DC | PRN
  Administered 2022-10-12: 1.5 mg/kg/h via INTRADERMAL

## 2022-10-12 MED ORDER — ONDANSETRON HCL 4 MG/2ML IJ SOLN
INTRAMUSCULAR | Status: AC
  Filled 2022-10-12: qty 2

## 2022-10-12 MED ORDER — MIDAZOLAM HCL 2 MG/2ML IJ SOLN
INTRAMUSCULAR | Status: AC
  Filled 2022-10-12: qty 2

## 2022-10-12 MED ORDER — LACTATED RINGERS IR SOLN
Status: DC | PRN
  Administered 2022-10-12: 1000 mL

## 2022-10-12 MED ORDER — FENTANYL CITRATE (PF) 100 MCG/2ML IJ SOLN
INTRAMUSCULAR | Status: DC | PRN
  Administered 2022-10-12: 100 ug via INTRAVENOUS

## 2022-10-12 MED ORDER — EPHEDRINE SULFATE (PRESSORS) 50 MG/ML IJ SOLN
INTRAMUSCULAR | Status: DC | PRN
  Administered 2022-10-12: 10 mg via INTRAVENOUS

## 2022-10-12 MED ORDER — BUPIVACAINE LIPOSOME 1.3 % IJ SUSP
INTRAMUSCULAR | Status: AC
  Filled 2022-10-12: qty 20

## 2022-10-12 MED ORDER — CHLORHEXIDINE GLUCONATE 0.12 % MT SOLN
15.0000 mL | Freq: Once | OROMUCOSAL | Status: AC
  Administered 2022-10-12: 15 mL via OROMUCOSAL

## 2022-10-12 MED ORDER — KETAMINE HCL 10 MG/ML IJ SOLN
INTRAMUSCULAR | Status: DC | PRN
Start: 2022-10-12 — End: 2022-10-12
  Administered 2022-10-12: 20 mg via INTRAVENOUS
  Administered 2022-10-12: 30 mg via INTRAVENOUS

## 2022-10-12 MED ORDER — ORAL CARE MOUTH RINSE: 15.0000 mL | Freq: Once | OROMUCOSAL | Status: AC

## 2022-10-12 MED ORDER — PROPOFOL 10 MG/ML IV BOLUS
INTRAVENOUS | Status: AC
  Filled 2022-10-12: qty 20

## 2022-10-12 MED ORDER — ONDANSETRON HCL 4 MG/2ML IJ SOLN
INTRAMUSCULAR | Status: DC | PRN
Start: 2022-10-12 — End: 2022-10-12
  Administered 2022-10-12: 4 mg via INTRAVENOUS

## 2022-10-12 MED ORDER — STERILE WATER FOR IRRIGATION IR SOLN
Status: DC | PRN
  Administered 2022-10-12: 1000 mL

## 2022-10-12 MED ORDER — METRONIDAZOLE 500 MG/100ML IV SOLN
500.0000 mg | INTRAVENOUS | Status: AC
  Administered 2022-10-12: 500 mg via INTRAVENOUS
  Filled 2022-10-12: qty 100

## 2022-10-12 MED ORDER — SCOPOLAMINE 1 MG/3DAYS TD PT72
1.0000 | MEDICATED_PATCH | TRANSDERMAL | Status: DC
  Administered 2022-10-12: 1.5 mg via TRANSDERMAL
  Filled 2022-10-12: qty 1

## 2022-10-12 MED ORDER — PHENYLEPHRINE HCL-NACL 20-0.9 MG/250ML-% IV SOLN
INTRAVENOUS | Status: DC | PRN
  Administered 2022-10-12: 40 ug/min via INTRAVENOUS

## 2022-10-12 MED ORDER — LIDOCAINE HCL (PF) 2 % IJ SOLN
INTRAMUSCULAR | Status: AC
  Filled 2022-10-12: qty 5

## 2022-10-12 MED ORDER — POVIDONE-IODINE 10 % EX SWAB: 2.0000 | Freq: Once | CUTANEOUS | Status: DC

## 2022-10-12 MED ORDER — HYDROMORPHONE HCL 1 MG/ML IJ SOLN
INTRAMUSCULAR | Status: AC
  Filled 2022-10-12: qty 1

## 2022-10-12 MED ORDER — BUPIVACAINE HCL 0.25 % IJ SOLN
INTRAMUSCULAR | Status: DC | PRN
  Administered 2022-10-12: 15 mL

## 2022-10-12 MED ORDER — DEXAMETHASONE SODIUM PHOSPHATE 4 MG/ML IJ SOLN
4.0000 mg | INTRAMUSCULAR | Status: AC
  Administered 2022-10-12: 5 mg via INTRAVENOUS

## 2022-10-12 MED ORDER — BUPIVACAINE LIPOSOME 1.3 % IJ SUSP
INTRAMUSCULAR | Status: DC | PRN
  Administered 2022-10-12: 20 mL

## 2022-10-12 MED ORDER — LACTATED RINGERS IV SOLN: INTRAVENOUS | Status: DC | PRN

## 2022-10-12 MED ORDER — PHENYLEPHRINE HCL (PRESSORS) 10 MG/ML IV SOLN
INTRAVENOUS | Status: DC | PRN
  Administered 2022-10-12 (×4): 160 ug via INTRAVENOUS

## 2022-10-12 MED ORDER — DROPERIDOL 2.5 MG/ML IJ SOLN: 0.6250 mg | Freq: Once | INTRAMUSCULAR | Status: DC | PRN

## 2022-10-12 MED ORDER — CEFAZOLIN SODIUM-DEXTROSE 2-4 GM/100ML-% IV SOLN
2.0000 g | INTRAVENOUS | Status: AC
  Administered 2022-10-12: 2 g via INTRAVENOUS
  Filled 2022-10-12: qty 100

## 2022-10-12 MED ORDER — ROCURONIUM BROMIDE 10 MG/ML (PF) SYRINGE
PREFILLED_SYRINGE | INTRAVENOUS | Status: AC
  Filled 2022-10-12: qty 20

## 2022-10-12 MED ORDER — LACTATED RINGERS IV SOLN: INTRAVENOUS | Status: DC

## 2022-10-12 MED ORDER — MIDAZOLAM HCL 5 MG/5ML IJ SOLN
INTRAMUSCULAR | Status: DC | PRN
  Administered 2022-10-12: 2 mg via INTRAVENOUS

## 2022-10-12 SURGICAL SUPPLY — 82 items
ADH SKN CLS APL DERMABOND .7 (GAUZE/BANDAGES/DRESSINGS) ×1
AGENT HMST KT MTR STRL THRMB (HEMOSTASIS) ×1
AGENT HMST MTR 8 SURGIFLO (HEMOSTASIS) ×1
APL ESCP 34 STRL LF DISP (HEMOSTASIS)
APPLICATOR SURGIFLO ENDO (HEMOSTASIS) IMPLANT
BAG LAPAROSCOPIC 12 15 PORT 16 (BASKET) IMPLANT
BAG RETRIEVAL 12/15 (BASKET) ×2
BLADE SURG SZ10 CARB STEEL (BLADE) IMPLANT
COVER BACK TABLE 60X90IN (DRAPES) ×1 IMPLANT
COVER TIP SHEARS 8 DVNC (MISCELLANEOUS) ×1 IMPLANT
DERMABOND ADVANCED .7 DNX12 (GAUZE/BANDAGES/DRESSINGS) ×1 IMPLANT
DRAPE ARM DVNC X/XI (DISPOSABLE) ×4 IMPLANT
DRAPE COLUMN DVNC XI (DISPOSABLE) ×1 IMPLANT
DRAPE SHEET LG 3/4 BI-LAMINATE (DRAPES) ×1 IMPLANT
DRAPE SURG IRRIG POUCH 19X23 (DRAPES) ×1 IMPLANT
DRIVER NDL MEGA SUTCUT DVNCXI (INSTRUMENTS) ×1 IMPLANT
DRIVER NDLE MEGA SUTCUT DVNCXI (INSTRUMENTS) ×1
DRSG OPSITE POSTOP 4X6 (GAUZE/BANDAGES/DRESSINGS) IMPLANT
DRSG OPSITE POSTOP 4X8 (GAUZE/BANDAGES/DRESSINGS) IMPLANT
ELECT PENCIL ROCKER SW 15FT (MISCELLANEOUS) IMPLANT
ELECT REM PT RETURN 15FT ADLT (MISCELLANEOUS) ×1 IMPLANT
FORCEPS BPLR FENES DVNC XI (FORCEP) ×1 IMPLANT
FORCEPS PROGRASP DVNC XI (FORCEP) ×1 IMPLANT
GAUZE 4X4 16PLY ~~LOC~~+RFID DBL (SPONGE) ×1 IMPLANT
GLOVE BIO SURGEON STRL SZ 6 (GLOVE) ×4 IMPLANT
GLOVE BIO SURGEON STRL SZ 6.5 (GLOVE) ×1 IMPLANT
GLOVE BIOGEL PI IND STRL 6.5 (GLOVE) ×2 IMPLANT
GOWN STRL REUS W/ TWL LRG LVL3 (GOWN DISPOSABLE) ×4 IMPLANT
GOWN STRL REUS W/TWL LRG LVL3 (GOWN DISPOSABLE) ×4
GRASPER SUT TROCAR 14GX15 (MISCELLANEOUS) IMPLANT
HOLDER FOLEY CATH W/STRAP (MISCELLANEOUS) IMPLANT
IRRIG SUCT STRYKERFLOW 2 WTIP (MISCELLANEOUS) ×1
IRRIGATION SUCT STRKRFLW 2 WTP (MISCELLANEOUS) ×1 IMPLANT
KIT TURNOVER KIT A (KITS) IMPLANT
LIGASURE IMPACT 36 18CM CVD LR (INSTRUMENTS) IMPLANT
MANIPULATOR ADVINCU DEL 3.0 PL (MISCELLANEOUS) IMPLANT
MANIPULATOR ADVINCU DEL 3.5 PL (MISCELLANEOUS) IMPLANT
MANIPULATOR UTERINE 4.5 ZUMI (MISCELLANEOUS) IMPLANT
NDL HYPO 21X1.5 SAFETY (NEEDLE) ×1 IMPLANT
NDL INSUFFLATION 14GA 120MM (NEEDLE) IMPLANT
NDL SPNL 20GX3.5 QUINCKE YW (NEEDLE) IMPLANT
NEEDLE HYPO 21X1.5 SAFETY (NEEDLE) ×1
NEEDLE INSUFFLATION 14GA 120MM (NEEDLE)
NEEDLE SPNL 20GX3.5 QUINCKE YW (NEEDLE)
OBTURATOR OPTICAL STND 8 DVNC (TROCAR) ×1
OBTURATOR OPTICALSTD 8 DVNC (TROCAR) ×1 IMPLANT
PACK ROBOT GYN CUSTOM WL (TRAY / TRAY PROCEDURE) ×1 IMPLANT
PAD ARMBOARD 7.5X6 YLW CONV (MISCELLANEOUS) ×1 IMPLANT
PAD POSITIONING PINK XL (MISCELLANEOUS) ×1 IMPLANT
PORT ACCESS TROCAR AIRSEAL 12 (TROCAR) IMPLANT
SCISSORS MNPLR CVD DVNC XI (INSTRUMENTS) ×1 IMPLANT
SCRUB CHG 4% DYNA-HEX 4OZ (MISCELLANEOUS) ×2 IMPLANT
SEAL UNIV 5-12 XI (MISCELLANEOUS) ×4 IMPLANT
SET TRI-LUMEN FLTR TB AIRSEAL (TUBING) ×1 IMPLANT
SPIKE FLUID TRANSFER (MISCELLANEOUS) ×1 IMPLANT
SPONGE SURGIFLO 8M (HEMOSTASIS) IMPLANT
SPONGE T-LAP 18X18 ~~LOC~~+RFID (SPONGE) IMPLANT
SURGIFLO W/THROMBIN 8M KIT (HEMOSTASIS) IMPLANT
SUT MNCRL AB 4-0 PS2 18 (SUTURE) IMPLANT
SUT PDS AB 1 TP1 54 (SUTURE) IMPLANT
SUT VIC AB 0 CT1 27 (SUTURE)
SUT VIC AB 0 CT1 27XBRD ANTBC (SUTURE) IMPLANT
SUT VIC AB 2-0 CT1 27 (SUTURE) ×1
SUT VIC AB 2-0 CT1 TAPERPNT 27 (SUTURE) IMPLANT
SUT VIC AB 2-0 SH 27 (SUTURE) ×1
SUT VIC AB 2-0 SH 27XBRD (SUTURE) IMPLANT
SUT VIC AB 4-0 PS2 18 (SUTURE) ×2 IMPLANT
SUT VICRYL 0 27 CT2 27 ABS (SUTURE) IMPLANT
SUT VICRYL AB 2 0 TIES (SUTURE) IMPLANT
SUT VLOC 180 0 9IN GS21 (SUTURE) IMPLANT
SYR 10ML LL (SYRINGE) IMPLANT
SYS BAG RETRIEVAL 10MM (BASKET)
SYS WOUND ALEXIS 18CM MED (MISCELLANEOUS) ×1
SYSTEM BAG RETRIEVAL 10MM (BASKET) IMPLANT
SYSTEM WOUND ALEXIS 18CM MED (MISCELLANEOUS) IMPLANT
TOWEL OR NON WOVEN STRL DISP B (DISPOSABLE) IMPLANT
TRAP SPECIMEN MUCUS 40CC (MISCELLANEOUS) IMPLANT
TRAY FOLEY MTR SLVR 16FR STAT (SET/KITS/TRAYS/PACK) ×1 IMPLANT
TROCAR PORT AIRSEAL 5X120 (TROCAR) IMPLANT
UNDERPAD 30X36 HEAVY ABSORB (UNDERPADS AND DIAPERS) ×2 IMPLANT
WATER STERILE IRR 1000ML POUR (IV SOLUTION) ×1 IMPLANT
YANKAUER SUCT BULB TIP 10FT TU (MISCELLANEOUS) IMPLANT

## 2022-10-12 NOTE — Transfer of Care (Signed)
Immediate Anesthesia Transfer of Care Note  Patient: Kirsten Rodriguez  Procedure(s) Performed: MINI LAPAROTOMY FOR CYST DRAINAGE XI ROBOTIC ASSISTED BILATERAL SALPINGO OOPHORECTOMY POSSIBLE XI ROBOTIC ASSISTED TOTAL HYSTERECTOMY OMENTECTOMY  Patient Location: PACU  Anesthesia Type:General  Level of Consciousness: awake, sedated, and patient cooperative  Airway & Oxygen Therapy: Patient Spontanous Breathing and Patient connected to face mask oxygen  Post-op Assessment: Report given to RN and Post -op Vital signs reviewed and stable  Post vital signs: Reviewed and stable  Last Vitals:  Vitals Value Taken Time  BP 111/61 10/12/22 1624  Temp    Pulse 74 10/12/22 1628  Resp 19 10/12/22 1628  SpO2 98 % 10/12/22 1628  Vitals shown include unfiled device data.  Last Pain:  Vitals:   10/12/22 0859  TempSrc: Oral  PainSc:          Complications: No notable events documented.

## 2022-10-12 NOTE — Discharge Instructions (Addendum)
AFTER SURGERY INSTRUCTIONS   Return to work: 4-6 weeks if applicable   You may have a white honeycomb dressing over your larger incision. This dressing can be removed 5 days after surgery and you do not need to reapply a new dressing. Once you remove the dressing, you will notice that you have the surgical glue (dermabond) on the incision and this will peel off on its own. You can get this dressing wet in the shower the days after surgery prior to removal on the 5th day.   Activity: 1. Be up and out of the bed during the day.  Take a nap if needed.  You may walk up steps but be careful and use the hand rail.  Stair climbing will tire you more than you think, you may need to stop part way and rest.    2. No lifting or straining for 6 weeks over 10 pounds. No pushing, pulling, straining for 6 weeks.   3. No driving for around 1 week(s).  Do not drive if you are taking narcotic pain medicine and make sure that your reaction time has returned.    4. You can shower as soon as the next day after surgery. Shower daily.  Use your regular soap and water (not directly on the incision) and pat your incision(s) dry afterwards; don't rub.  No tub baths or submerging your body in water until cleared by your surgeon. If you have the soap that was given to you by pre-surgical testing that was used before surgery, you do not need to use it afterwards because this can irritate your incisions.    5. No sexual activity and nothing in the vagina for 6 weeks, 12 weeks if you have a hysterectomy.   6. You may experience a small amount of clear drainage from your incisions, which is normal.  If the drainage persists, increases, or changes color please call the office.   7. Do not use creams, lotions, or ointments such as neosporin on your incisions after surgery until advised by your surgeon because they can cause removal of the dermabond glue on your incisions.     8. You may experience vaginal spotting after surgery  or around the 6-8 week mark from surgery when the stitches at the top of the vagina begin to dissolve.  The spotting is normal but if you experience heavy bleeding, call our office.   9. Take Tylenol or ibuprofen first for pain if you are able to take these medications and only use narcotic pain medication for severe pain not relieved by the Tylenol or Ibuprofen.  Monitor your Tylenol intake to a max of 4,000 mg in a 24 hour period. You can alternate these medications after surgery.   Diet: 1. Low sodium Heart Healthy Diet is recommended but you are cleared to resume your normal (before surgery) diet after your procedure.   2. It is safe to use a laxative, such as Miralax or Colace, if you have difficulty moving your bowels. You have been prescribed Sennakot-S to take at bedtime every evening after surgery to keep bowel movements regular and to prevent constipation.     Wound Care: 1. Keep clean and dry.  Shower daily.   Reasons to call the Doctor: Fever - Oral temperature greater than 100.4 degrees Fahrenheit Foul-smelling vaginal discharge Difficulty urinating Nausea and vomiting Increased pain at the site of the incision that is unrelieved with pain medicine. Difficulty breathing with or without chest pain New calf pain  especially if only on one side Sudden, continuing increased vaginal bleeding with or without clots.   Contacts: For questions or concerns you should contact:   Dr. Clide Cliff at [phone]   Warner Mccreedy, NP at [phone]   After Hours: call [phone] and have the GYN Oncologist paged/contacted (after 5 pm or on the weekends). You will speak with an after hours RN and let he or she know you have had surgery.   Messages sent via mychart are for non-urgent matters and are not responded to after hours so for urgent needs, please call the after hours number.

## 2022-10-12 NOTE — Brief Op Note (Signed)
10/12/2022  4:18 PM  PATIENT:  Kirsten Rodriguez  47 y.o. 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)  PROCEDURE:  Procedure(s): MINI LAPAROTOMY FOR CYST DRAINAGE (N/A) XI ROBOTIC ASSISTED BILATERAL SALPINGO OOPHORECTOMY (N/A) POSSIBLE XI ROBOTIC ASSISTED TOTAL HYSTERECTOMY (N/A) OMENTECTOMY (N/A)  SURGEON:  Surgeons and Role:    Clide Cliff, MD - Primary   ANESTHESIA:   general  EBL:  250 mL   BLOOD ADMINISTERED:none  DRAINS: none   LOCAL MEDICATIONS USED:  BUPIVICAINE  and OTHER Exparel  SPECIMEN:   ID Type Source Tests Collected by Time Destination  1 : Right ovary and tube Tissue PATH Gyn tumor resection SURGICAL PATHOLOGY Clide Cliff, MD 10/12/2022 1314   2 : Uterus,cervix, left tube and ovary Tissue PATH Gyn tumor resection SURGICAL PATHOLOGY Clide Cliff, MD 10/12/2022 1430   3 : Omentum Tissue PATH GI tumor resection SURGICAL PATHOLOGY Clide Cliff, MD 10/12/2022 1532   A : Pelvic washings Body Fluid PATH Cytology Pelvic Washing CYTOLOGY - NON PAP Clide Cliff, MD 10/12/2022 1247   B : Urine for urinalysis and  culture Urine Urine, Catheterized URINE CULTURE Clide Cliff, MD 10/12/2022 1410      DISPOSITION OF SPECIMEN:  PATHOLOGY  COUNTS:  YES  TOURNIQUET:  * No tourniquets in log *  DICTATION: .Note written in EPIC  PLAN OF CARE: Discharge to home after PACU  PATIENT DISPOSITION:  PACU - hemodynamically stable.   Delay start of Pharmacological VTE agent (>24hrs) due to surgical blood loss or risk of bleeding: not applicable

## 2022-10-12 NOTE — Interval H&P Note (Signed)
History and Physical Interval Note:  10/12/2022 11:40 AM  Kirsten Rodriguez  has presented today for surgery, with the diagnosis of ADNEXAL MASS.  The various methods of treatment have been discussed with the patient and family. After consideration of risks, benefits and other options for treatment, the patient has consented to  Procedure(s): MINI LAPAROTOMY FOR CYST DRAINAGE, POSSIBLE STAGING (N/A) XI ROBOTIC ASSISTED VS OPEN SALPINGO OOPHORECTOMY; UNILATERAL VS. BILATERAL (N/A) POSSIBLE XI ROBOTIC ASSISTED VS POSSIBLE OPEN TOTAL HYSTERECTOMY (N/A) as a surgical intervention. We discussed based on her labs, endometrial biopsy, plan for USO only if not borderline or cancer. Would also plan for no hysterectomy unless borderline or cancer. If no hysterectomy, will plan dilation and curettage.    The patient's history has been reviewed, patient examined, no change in status, stable for surgery.  I have reviewed the patient's chart and labs.  Questions were answered to the patient's satisfaction.     Kirsten Rodriguez

## 2022-10-12 NOTE — Op Note (Signed)
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 Increased the duration of the procedure by 60 minutes.)   Surgeon: Clide Cliff, MD  Assistants: Antionette Char, MD and (an MD assistant was necessary for tissue manipulation, management of robotic instrumentation, retraction and positioning due to the complexity of the case and hospital policies)  Anesthesia: General  Estimated Blood Loss: 250 mL    Fluids: 3700 ml, crystalloid  Urine Output: 550 ml, clear yellow  Findings: On bimanual exam, mobile pelvic mass that arises to the level of the mid abdomen. On entry to abdomen, upper abdominal survey with nodular appearance of the liver, possibly consistent with cirrhosis. Otherwise smooth diaphragm, stomach and normal appearing omentum and bowel. Cystic and solid mass with smooth surface filling the pelvis. Controlled drainage of one portion of the cystic mass performed with yellow cyst fluid drained. On robotic portion of right salpingo-oophorectomy, incidental leakage of cyst fluid did occur. Remainder of mass drained and morcellated in a bag out of the supraumbilical incision with alexis retractor. 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 Normal appearing left fallopian tube and ovary. 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    Specimens:  ID Type Source Tests Collected by Time Destination  1 : Right ovary and tube Tissue  PATH Gyn tumor resection SURGICAL PATHOLOGY Clide Cliff, MD 10/12/2022 1314   2 : Uterus,cervix, left tube and ovary Tissue PATH Gyn tumor resection SURGICAL PATHOLOGY Clide Cliff, MD 10/12/2022 1430   3 : Omentum Tissue PATH GI tumor resection SURGICAL PATHOLOGY Clide Cliff, MD 10/12/2022 1532   A : Pelvic washings Body Fluid PATH Cytology Pelvic Washing CYTOLOGY - NON PAP Clide Cliff, MD 10/12/2022 1247   B : Urine for urinalysis and  culture Urine Urine, Catheterized URINE CULTURE Clide Cliff, MD 10/12/2022 1410     Complications:  None  Indications for Procedure: Kirsten Rodriguez is a 47 y.o. woman with a complex right adnexal mass and abnormal uterine bleeding.  Prior to the procedure, all risks, benefits, and alternatives were discussed and informed surgical consent was signed.  Procedure: Patient was taken to the operating room where general anesthesia was achieved.  She was positioned in dorsal lithotomy and prepped and draped.  A foley catheter was inserted into the bladder.  A hulka manipulator was secured in the cervix.    A 5 mm incision was made in the left upper quadrant near Palmer's point.  The abdomen was entered with a 5 mm OptiView trocar under direct visualization.  The abdomen was insufflated and examined with the findings as noted above. Decision was made to proceed with minilap for controlled drainage of cyst.  A 5 cm vertical midline supraumbilical incision was made with the scalpel and the abdomen was entered sharply.  Washings were obtained.  A pursestring stitch was placed in the mass with 2-0 Vicryl.  Using a gallbladder trocar, a large component of the cyst was drained in a controlled fashion.  The pursestring stitch was secured with no leakage from the mass. An Geophysical data processor system was placed and a robotic trocar placed  through the cap. The abdomen was re-insufflated. Additional trocars were placed as follows: A 5 mm air seal trocar to replace the  5mm optiview trocar in the left upper quadrant, one 8 mm robotic trocar in the right abdomen, and one 8mm robotic trocar in the left abdomen. All trocars were placed under direct visualization. The patient placed in steep Trendelenburg. The bowels were moved into the upper abdomen.  The DaVinci robotic surgical system was brought to the patient's bedside and docked.   The peritoneum over the right pelvic sidewall was incised and the retroperitoneum entered.  The right ureter was identified.  The right infundibulopelvic ligament was isolated, cauterized, and transected.  The broad ligament was incised towards the cornua.  The right proximal fallopian tube and the utero-ovarian ligament was then isolated, cauterized, and transected.  In the course of this dissection, incidental leakage of part of the cystic mass occurred.  The right robotic instrument and trocar were then removed.  A 15 Endo Catch bag was then introduced through this incision.  The right adnexa was then placed in the Endo Catch bag.  The robotic instruments were removed and the trocars detached from the robot.  The Alexis cap was removed and the Endo Catch bag was brought up through the supraumbilical incision.  The mass was additionally drained and morcellated in the bag out of the incision until the entire mass and bag were removed from the abdomen.  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  At this time, the Hulka was removed. The cervix was dilated and an Advincula uterine manipulator with a colpotomy ring was inserted into the uterus.  The robot was re-docked and instruments reintroduced into the abdomen.  The right round ligament was transected. The right ureter was again identified.  The posterior peritoneum was opened to the KOH ring.  The anterior peritoneum was opened and the bladder flap was created.  The right uterine artery was skeletonized, cauterized,  and transected at the level of the KOH ring. Additional cautery was used in a C-shaped fashion to allow the remainder of the broad, cardinal, and uterosacral ligaments with the uterine vessels to be transected and fall away from the KOH ring. A similar procedure was performed on the left side isolation, cauterization, and transection of the left infundibulopelvic ligament to remove the left fallopian tube and ovary with the uterine specimen.  A colpotomy was made circumferentially following the contours of the KOH ring.  The uterine specimen was removed through the vagina.    The vaginal cuff was closed with a running stitch of 0 Vicryl suture.  The pelvis was irrigated.  Due to some oozing of the vaginal cuff, Floseal was placed at the cuff.  All operative sites were found to be hemostatic.  All instruments were removed and the robot was taken from the patient's bedside.   The fascia at the supraumbilical incision was closed with a running stitch#1 PDS. Exparel was injected at the incision sites in standard fashion. The subcutaneous tissues were irrigated and hemostasis achieved. The subcutaneous space was approximated with 2-0 Vicryl in a running fashion.  A deep dermal stitch was placed with 4-0 Vicryl. The skin was closed with 4-0 monocryl in a subcuticular fashion followed by surgical glue. The fascia at the right robotic incision was closed with a figure of eight of 0-vicryl. The skin at the laparoscopic incisions was closed with 4-0 Vicryl to  reapproximate the subcutaneous tissue and 4-0 monocryl in a subcuticular fashion followed by surgical glue.   Patient tolerated the procedure well. Sponge, lap, and instrument counts were correct.  Patient received 2 gm of Ancef and 500mg  metronidazole prior to skin incision for routine perioperative antibiotic prophylaxis.  She was extubated and taken to the PACU in stable condition.  Clide Cliff, MD Gynecologic Oncology

## 2022-10-12 NOTE — Anesthesia Procedure Notes (Signed)
Procedure Name: Intubation Date/Time: 10/12/2022 12:14 PM  Performed by: Johnette Abraham, CRNAPre-anesthesia Checklist: Patient identified, Emergency Drugs available, Suction available and Patient being monitored Patient Re-evaluated:Patient Re-evaluated prior to induction Oxygen Delivery Method: Circle System Utilized Preoxygenation: Pre-oxygenation with 100% oxygen Induction Type: IV induction Ventilation: Mask ventilation without difficulty Laryngoscope Size: Mac and 3 Grade View: Grade II Tube type: Oral Tube size: 7.0 mm Number of attempts: 2 Airway Equipment and Method: Stylet and Oral airway Placement Confirmation: ETT inserted through vocal cords under direct vision, positive ETCO2 and breath sounds checked- equal and bilateral Secured at: 22 cm Tube secured with: Tape Dental Injury: Teeth and Oropharynx as per pre-operative assessment

## 2022-10-12 NOTE — Anesthesia Preprocedure Evaluation (Addendum)
Anesthesia Evaluation  Patient identified by MRN, date of birth, ID band Patient awake    Reviewed: Allergy & Precautions, NPO status , Patient's Chart, lab work & pertinent test results  History of Anesthesia Complications (+) PONV and history of anesthetic complications  Airway Mallampati: III  TM Distance: >3 FB Neck ROM: Full    Dental no notable dental hx.    Pulmonary former smoker   Pulmonary exam normal        Cardiovascular hypertension, Normal cardiovascular exam     Neuro/Psych  Headaches    GI/Hepatic negative GI ROS, Neg liver ROS,,,  Endo/Other  diabetes (on Ozempic (last dose 10/01/22)), Type 2, Oral Hypoglycemic Agents    Renal/GU negative Renal ROS  negative genitourinary   Musculoskeletal  (+) Arthritis ,    Abdominal   Peds  Hematology Plt 108k   Anesthesia Other Findings Day of surgery medications reviewed with patient.  Reproductive/Obstetrics ADNEXAL MASS                              Anesthesia Physical Anesthesia Plan  ASA: 3  Anesthesia Plan: General   Post-op Pain Management: Tylenol PO (pre-op)*   Induction: Intravenous  PONV Risk Score and Plan: 4 or greater and Treatment may vary due to age or medical condition, Ondansetron, Dexamethasone, Midazolam, Scopolamine patch - Pre-op, Propofol infusion and TIVA  Airway Management Planned: Oral ETT  Additional Equipment: None  Intra-op Plan:   Post-operative Plan: Extubation in OR  Informed Consent: I have reviewed the patients History and Physical, chart, labs and discussed the procedure including the risks, benefits and alternatives for the proposed anesthesia with the patient or authorized representative who has indicated his/her understanding and acceptance.     Dental advisory given  Plan Discussed with: CRNA  Anesthesia Plan Comments:         Anesthesia Quick Evaluation

## 2022-10-13 ENCOUNTER — Other Ambulatory Visit (HOSPITAL_COMMUNITY): Payer: Self-pay

## 2022-10-13 ENCOUNTER — Telehealth: Payer: Self-pay | Admitting: *Deleted

## 2022-10-13 ENCOUNTER — Encounter (HOSPITAL_COMMUNITY): Payer: Self-pay | Admitting: Psychiatry

## 2022-10-13 LAB — URINE CULTURE: Culture: NO GROWTH

## 2022-10-13 LAB — CANCER ANTIGEN 19-9: CA 19-9: 27 U/mL (ref 0–35)

## 2022-10-13 NOTE — Telephone Encounter (Signed)
Spoke with Kirsten Rodriguez this morning. She states she is eating, drinking and urinating well. She has not had a BM yet but is passing gas. She is taking senokot as prescribed and encouraged her to drink plenty of water. She denies fever or chills. Incisions are dry and intact. She rates her pain 3/10. Her pain is controlled with tylenol & ibuprofen.     Instructed to call office with any fever, chills, purulent drainage, uncontrolled pain or any other questions or concerns. Patient verbalizes understanding.   Pt aware of post op appointments as well as the office number [phone] and after hours number [phone] to call if she has any questions or concerns

## 2022-10-14 ENCOUNTER — Other Ambulatory Visit (HOSPITAL_COMMUNITY): Payer: Self-pay

## 2022-10-14 LAB — CYTOLOGY - NON PAP

## 2022-10-14 NOTE — Anesthesia Postprocedure Evaluation (Signed)
Anesthesia Post Note  Patient: Kirsten Rodriguez  Procedure(s) Performed: MINI LAPAROTOMY FOR CYST DRAINAGE XI ROBOTIC ASSISTED BILATERAL SALPINGO OOPHORECTOMY POSSIBLE XI ROBOTIC ASSISTED TOTAL HYSTERECTOMY OMENTECTOMY     Patient location during evaluation: PACU Anesthesia Type: General Level of consciousness: awake and alert Pain management: pain level controlled Vital Signs Assessment: post-procedure vital signs reviewed and stable Respiratory status: spontaneous breathing, nonlabored ventilation and respiratory function stable Cardiovascular status: blood pressure returned to baseline Postop Assessment: no apparent nausea or vomiting Anesthetic complications: no   No notable events documented.  Last Vitals:  Vitals:   10/12/22 1821 10/12/22 1830  BP: (!) 142/91 (!) 145/82  Pulse: 65 66  Resp: 20   Temp: 36.4 C   SpO2: 95% 94%    Last Pain:  Vitals:   10/12/22 1830  TempSrc:   PainSc: 2                  Shanda Howells

## 2022-10-15 ENCOUNTER — Other Ambulatory Visit (HOSPITAL_COMMUNITY): Payer: Self-pay

## 2022-10-18 ENCOUNTER — Telehealth: Payer: Self-pay

## 2022-10-18 NOTE — Telephone Encounter (Signed)
Kirsten Rodriguez called office this morning stating she had surgery with Dr. Alvester Morin last week on Tuesday 8/21. 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 There will be no strenuous work involved.   Melissa Cross APP, advised ok to work from home as long as she takes frequent resting breaks and does not over do it. Pt states an understanding. Pt aware of her post op appointment on 9/9 with Dr. Alvester Morin

## 2022-10-28 ENCOUNTER — Encounter: Payer: Self-pay | Admitting: Psychiatry

## 2022-11-01 ENCOUNTER — Inpatient Hospital Stay: Payer: Commercial Managed Care - PPO | Attending: Psychiatry | Admitting: Psychiatry

## 2022-11-01 ENCOUNTER — Other Ambulatory Visit: Payer: Self-pay | Admitting: Oncology

## 2022-11-01 ENCOUNTER — Encounter: Payer: Self-pay | Admitting: Oncology

## 2022-11-01 ENCOUNTER — Encounter: Payer: Self-pay | Admitting: Gynecologic Oncology

## 2022-11-01 VITALS — BP 105/69 | HR 82 | Temp 98.4°F | Resp 18 | Wt 229.6 lb

## 2022-11-01 DIAGNOSIS — Z90722 Acquired absence of ovaries, bilateral: Secondary | ICD-10-CM | POA: Diagnosis not present

## 2022-11-01 DIAGNOSIS — C561 Malignant neoplasm of right ovary: Secondary | ICD-10-CM | POA: Diagnosis not present

## 2022-11-01 DIAGNOSIS — Z90710 Acquired absence of both cervix and uterus: Secondary | ICD-10-CM

## 2022-11-01 DIAGNOSIS — Z7189 Other specified counseling: Secondary | ICD-10-CM

## 2022-11-01 DIAGNOSIS — N838 Other noninflammatory disorders of ovary, fallopian tube and broad ligament: Secondary | ICD-10-CM

## 2022-11-01 NOTE — Progress Notes (Unsigned)
Gynecologic Oncology Return Clinic Visit  Date of Service: 11/01/2022 Referring Provider: Koren Shiver, DO [address] [facility] [address] [place],  [place] [address]***  Assessment & Plan: Kirsten Rodriguez is a 47 y.o. woman with Stage *** who is *** weeks s/p *** on ***.  Postop: - Pt recovering well from surgery and healing appropriately postoperatively - Intraoperative findings and pathology results reviewed. - Ongoing postoperative expectations and precautions reviewed. Continue with no lifting >10lbs through 6 weeks postoperatively - Pt works ***. 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  - Reviewed that after 12 months without menstrual cycles, she should not have any spotting or bleeding.  If this were to occur, she should be evaluated for postmenopausal bleeding.  ***  ***VTE Prophylaxis: - Khorana score = ***  RTC ***.  Clide Cliff, MD Gynecologic Oncology   Medical Decision Making I personally spent  TOTAL *** minutes face-to-face and non-face-to-face in the care of this patient, which includes all pre, intra, and post visit time on the date of service.  *** minutes spent reviewing records prior to the visit *** Minutes in patient contact      *** minutes in other billable services *** minutes charting , conferring with consultants etc.   ----------------------- Reason for Visit: Postop***  Treatment History: Oncology History   No history exists.    Interval History: Pt reports that she is recovering well from surgery. She is not needing anything for pain. She is eating and drinking well. She is voiding without issue and having regular bowel movements. No bleeding.  Hjas not yet resumed taltz - has called to get that back restarted   Past Medical/Surgical History: Past Medical History:  Diagnosis Date    Diabetes mellitus without complication (HCC)    Headache    Hypertension    PONV (postoperative nausea and vomiting)    Psoriatic arthritis (HCC)     Past Surgical History:  Procedure Laterality Date   BACK SURGERY     L-spine   KNEE SURGERY Right    LAPAROTOMY WITH STAGING N/A 10/12/2022   Procedure: MINI LAPAROTOMY FOR CYST DRAINAGE;  Surgeon: Clide Cliff, MD;  Location: [facility];  Service: Gynecology;  Laterality: N/A;   OMENTECTOMY N/A 10/12/2022   Procedure: OMENTECTOMY;  Surgeon: Clide Cliff, MD;  Location: [facility];  Service: Gynecology;  Laterality: N/A;   ROBOTIC ASSISTED SALPINGO OOPHERECTOMY N/A 10/12/2022   Procedure: XI ROBOTIC ASSISTED BILATERAL SALPINGO OOPHORECTOMY;  Surgeon: Clide Cliff, MD;  Location: [facility];  Service: Gynecology;  Laterality: N/A;   ROBOTIC ASSISTED TOTAL HYSTERECTOMY N/A 10/12/2022   Procedure: POSSIBLE XI ROBOTIC ASSISTED TOTAL HYSTERECTOMY;  Surgeon: Clide Cliff, MD;  Location: [facility];  Service: Gynecology;  Laterality: N/A;    Family History  Adopted: Yes    Social History   Socioeconomic History   Marital status: Married    Spouse name: Not on file   Number of children: Not on file   Years of education: Not on file   Highest education level: Not on file  Occupational History   Not on file  Tobacco Use   Smoking status: Former    Types: Cigarettes   Smokeless tobacco: Never   Tobacco comments:    Socially in college  Vaping Use   Vaping status: Never Used  Substance and Sexual  Activity   Alcohol use: Not Currently   Drug use: No   Sexual activity: Yes  Other Topics Concern   Not on file  Social History Narrative   Not on file   Social Determinants of Health   Financial Resource Strain: Not on file  Food Insecurity: Not on file  Transportation Needs: Not on file  Physical Activity: Not on file  Stress: Not on file  Social Connections: Unknown (07/02/2022)   Received from [facility]   Social Network     Social Network: Not on file    Current Medications:  Current Outpatient Medications:    Biotin 5000 MCG CAPS, Take 5,000 mcg by mouth daily., Disp: , Rfl:    clobetasol cream (TEMOVATE) 0.05 %, Apply 1 Application topically 2 (two) times daily for 14 days (Patient taking differently: Apply 1 Application topically 2 (two) times daily as needed (irritation).), Disp: 30 g, Rfl: 1   empagliflozin (JARDIANCE) 10 MG TABS tablet, Take 1 tablet (10 mg total) by mouth every morning with or without food, Disp: 90 tablet, Rfl: 1   glucose blood (FREESTYLE LITE) test strip, Use to check blood sugar 3-4 times a day, Disp: 100 strip, Rfl: 3   Lancets (FREESTYLE) lancets, Use to test blood sugar 3 - 4 times a day, Disp: 100 each, Rfl: 3   losartan-hydrochlorothiazide (HYZAAR) 50-12.5 MG tablet, Take 1 tablet by mouth daily. (Blood pressure goal is 130/80), Disp: 90 tablet, Rfl: 3   metFORMIN (GLUCOPHAGE) 1000 MG tablet, Take 1 tablet by mouth 2 times daily with a meal., Disp: 180 tablet, Rfl: 2   Multiple Vitamins-Minerals (MULTIVITAMIN GUMMIES WOMENS PO), Take 2 each by mouth daily., Disp: , Rfl:    rosuvastatin (CRESTOR) 5 MG tablet, Take 1 tablet (5 mg total) by mouth 3 (three) times a week., Disp: 36 tablet, Rfl: 1   Semaglutide, 2 MG/DOSE, (OZEMPIC, 2 MG/DOSE,) 8 MG/3ML SOPN, Inject 2mg  under the skin once a week as directed., Disp: 9 mL, Rfl: 3   SUMAtriptan (IMITREX) 100 MG tablet, Take 1 tablet by mouth at onset of headach. ,Maybe repeat in 2 hours if still having migraine. Max daily dose of 200mg  per day. (Patient taking differently: Take 50 mg by mouth every 2 (two) hours as needed for migraine or headache. Take 0.5 tablet by mouth at onset of headach. ,Maybe repeat in 2 hours if still having migraine. Max daily dose of 200mg  per day.), Disp: 27 tablet, Rfl: 1   Ixekizumab (TALTZ) 80 MG/ML SOSY, Inject 1 ml Subcutaneous Every 4 weeks 30 days (Patient not taking: Reported on 10/28/2022), Disp: 1 mL, Rfl:  5  Review of Symptoms: Complete 10-system review is negative except as above in Interval History.  Physical Exam: BP 105/69 (BP Location: Left Arm, Patient Position: Sitting)   Pulse 82   Temp 98.4 F (36.9 C) (Oral)   Resp 18   Wt 229 lb 9.6 oz (104.1 kg)   SpO2 99%   BMI 33.91 kg/m  General: ***Alert, oriented, no acute distress. HEENT: ***Normocephalic, atraumatic. Neck symmetric without masses. Sclera anicteric.  Chest: ***Normal work of breathing. ***Clear to auscultation bilaterally.   Cardiovascular: ***Regular rate and rhythm, no murmurs. Abdomen: ***Soft, nontender.  Normoactive bowel sounds.  No masses appreciated.  Well-healing incisions with glue. Extremities: ***Grossly normal range of motion.  Warm, well perfused.  No edema bilaterally. Skin: ***No rashes or lesions noted. GU: Normal appearing external genitalia without erythema, excoriation, or lesions.  Speculum exam reveals ***.  Bimanual exam reveals ***. Exam chaperoned by Andrey Cota, RN   Laboratory & Radiologic Studies: ***

## 2022-11-01 NOTE — Progress Notes (Signed)
Met with Kirsten Rodriguez and advised her of new patient appointment with Dr. Bertis Ruddy on 11/09/22 at 2:00 with 1:30 arrival.

## 2022-11-01 NOTE — Progress Notes (Signed)
Gynecologic Oncology Multi-Disciplinary Disposition Conference Note  Date of the Conference: 11/01/2022  Patient Name: Kirsten Rodriguez  Referring Provider: Dr. Karene Fry Primary GYN Oncologist: Dr. 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 Disposition is to systemic therapy.   This Multidisciplinary conference took place involving physicians from Gynecologic Oncology, Medical Oncology, Radiation Oncology, Pathology, Radiology along with the Gynecologic Oncology Nurse Practitioner and Gynecologic Oncology Nurse Navigator.  Comprehensive assessment of the patient's malignancy, staging, need for surgery, chemotherapy, radiation therapy, and need for further testing were reviewed. Supportive measures, both inpatient and following discharge were also discussed. The recommended plan of care is documented. Greater than 35 minutes were spent correlating and coordinating this patient's care.

## 2022-11-01 NOTE — Patient Instructions (Signed)
It was a pleasure to see you in clinic today. - You are healing well. - Return after completion of chemotherapy.   Thank you very much for allowing me to provide care for you today.  I appreciate your confidence in choosing our Gynecologic Oncology team at [facility].  If you have any questions about your visit today please call our office or send [place] a MyChart message and we will get back to you as soon as possible.

## 2022-11-02 ENCOUNTER — Encounter: Payer: Self-pay | Admitting: Hematology and Oncology

## 2022-11-02 ENCOUNTER — Encounter: Payer: Self-pay | Admitting: Psychiatry

## 2022-11-02 DIAGNOSIS — E669 Obesity, unspecified: Secondary | ICD-10-CM | POA: Insufficient documentation

## 2022-11-02 DIAGNOSIS — R161 Splenomegaly, not elsewhere classified: Secondary | ICD-10-CM | POA: Insufficient documentation

## 2022-11-02 DIAGNOSIS — E119 Type 2 diabetes mellitus without complications: Secondary | ICD-10-CM | POA: Insufficient documentation

## 2022-11-02 DIAGNOSIS — C561 Malignant neoplasm of right ovary: Secondary | ICD-10-CM | POA: Insufficient documentation

## 2022-11-02 DIAGNOSIS — K7460 Unspecified cirrhosis of liver: Secondary | ICD-10-CM | POA: Insufficient documentation

## 2022-11-02 NOTE — Progress Notes (Unsigned)
[facility] CONSULT NOTE  Patient Care Team: Masneri, Wille Celeste, DO as PCP - General (Family Medicine)  ASSESSMENT & PLAN:  Granulosa cell carcinoma of ovary, right [facility]) We reviewed the NCCN guidelines We discussed the role of chemotherapy. The intent is of curative intent.  We discussed some of the risks, benefits, side-effects of carboplatin & Taxol. Treatment is intravenous, every 3 weeks x 6 cycles  Some of the short term side-effects included, though not limited to, including weight loss, life threatening infections, risk of allergic reactions, need for transfusions of blood products, nausea, vomiting, change in bowel habits, loss of hair, admission to hospital for various reasons, and risks of death.   Long term side-effects are also discussed including risks of infertility, permanent damage to nerve function, hearing loss, chronic fatigue, kidney damage with possibility needing hemodialysis, and rare secondary malignancy including bone marrow disorders.  The patient is aware that the response rates discussed earlier is not guaranteed.  After a long discussion, patient made an informed decision to proceed with the prescribed plan of care.   Patient education material was dispensed. We discussed premedication with dexamethasone before chemotherapy. I recommend port placement, chemo education class and labs before starting on treatment I plan repeat imaging study after completion of 6 cycles of chemo  Diabetes mellitus without complication (HCC) We discussed risks of hyperglycemia while on treatment I recommend strict dietary modification while on treatment  No orders of the defined types were placed in this encounter.   The total time spent in the appointment was {CHL ONC TIME VISIT - [id number]} encounter with patients including review of chart and various tests results, discussions about plan of care and coordination of care plan   All questions were  answered. The patient knows to call the clinic with any problems, questions or concerns. No barriers to learning was detected.  Artis Delay, MD 9/10/20243:23 PM  CHIEF COMPLAINTS/PURPOSE OF CONSULTATION:  Right ovarian cancer for adjuvant chemo  HISTORY OF PRESENTING ILLNESS:  Kirsten Rodriguez 47 y.o. female is here because of *** I have reviewed her chart and materials related to her cancer extensively and collaborated history with the patient. Summary of oncologic history is as follows: Oncology History   No history exists.    MEDICAL HISTORY:  Past Medical History:  Diagnosis Date   Diabetes mellitus without complication (HCC)    Headache    Hypertension    PONV (postoperative nausea and vomiting)    Psoriatic arthritis (HCC)     SURGICAL HISTORY: Past Surgical History:  Procedure Laterality Date   BACK SURGERY     L-spine   KNEE SURGERY Right    LAPAROTOMY WITH STAGING N/A 10/12/2022   Procedure: MINI LAPAROTOMY FOR CYST DRAINAGE;  Surgeon: Clide Cliff, MD;  Location: [facility];  Service: Gynecology;  Laterality: N/A;   OMENTECTOMY N/A 10/12/2022   Procedure: OMENTECTOMY;  Surgeon: Clide Cliff, MD;  Location: [facility];  Service: Gynecology;  Laterality: N/A;   ROBOTIC ASSISTED SALPINGO OOPHERECTOMY N/A 10/12/2022   Procedure: XI ROBOTIC ASSISTED BILATERAL SALPINGO OOPHORECTOMY;  Surgeon: Clide Cliff, MD;  Location: [facility];  Service: Gynecology;  Laterality: N/A;   ROBOTIC ASSISTED TOTAL HYSTERECTOMY N/A 10/12/2022   Procedure: POSSIBLE XI ROBOTIC ASSISTED TOTAL HYSTERECTOMY;  Surgeon: Clide Cliff, MD;  Location: [facility];  Service: Gynecology;  Laterality: N/A;    SOCIAL HISTORY: Social History   Socioeconomic History   Marital status: Married    Spouse name: Not on file  Number of children: Not on file   Years of education: Not on file   Highest education level: Not on file  Occupational History   Not on file  Tobacco Use   Smoking status: Former     Types: Cigarettes   Smokeless tobacco: Never   Tobacco comments:    Socially in college  Vaping Use   Vaping status: Never Used  Substance and Sexual Activity   Alcohol use: Not Currently   Drug use: No   Sexual activity: Yes  Other Topics Concern   Not on file  Social History Narrative   Not on file   Social Determinants of Health   Financial Resource Strain: Not on file  Food Insecurity: Not on file  Transportation Needs: Not on file  Physical Activity: Not on file  Stress: Not on file  Social Connections: Unknown (07/02/2022)   [facility]   Social Network    Social Network: Not on file  Intimate Partner Violence: Unknown (07/02/2022)   Received from [facility]   HITS    Physically Hurt: Not on file    Insult or Talk Down To: Not on file    Threaten Physical Harm: Not on file    Scream or Curse: Not on file    FAMILY HISTORY: Family History  Adopted: Yes    ALLERGIES:  has No Known Allergies.  MEDICATIONS:  Current Outpatient Medications  Medication Sig Dispense Refill   Biotin 5000 MCG CAPS Take 5,000 mcg by mouth daily.     clobetasol cream (TEMOVATE) 0.05 % Apply 1 Application topically 2 (two) times daily for 14 days (Patient taking differently: Apply 1 Application topically 2 (two) times daily as needed (irritation).) 30 g 1   empagliflozin (JARDIANCE) 10 MG TABS tablet Take 1 tablet (10 mg total) by mouth every morning with or without food 90 tablet 1   glucose blood (FREESTYLE LITE) test strip Use to check blood sugar 3-4 times a day 100 strip 3   Ixekizumab (TALTZ) 80 MG/ML SOSY Inject 1 ml Subcutaneous Every 4 weeks 30 days (Patient not taking: Reported on 10/28/2022) 1 mL 5   Lancets (FREESTYLE) lancets Use to test blood sugar 3 - 4 times a day 100 each 3   losartan-hydrochlorothiazide (HYZAAR) 50-12.5 MG tablet Take 1 tablet by mouth daily. (Blood pressure goal is 130/80) 90 tablet 3   metFORMIN (GLUCOPHAGE) 1000 MG tablet Take 1 tablet  by mouth 2 times daily with a meal. 180 tablet 2   Multiple Vitamins-Minerals (MULTIVITAMIN GUMMIES WOMENS PO) Take 2 each by mouth daily.     rosuvastatin (CRESTOR) 5 MG tablet Take 1 tablet (5 mg total) by mouth 3 (three) times a week. 36 tablet 1   Semaglutide, 2 MG/DOSE, (OZEMPIC, 2 MG/DOSE,) 8 MG/3ML SOPN Inject 2mg  under the skin once a week as directed. 9 mL 3   SUMAtriptan (IMITREX) 100 MG tablet Take 1 tablet by mouth at onset of headach. ,Maybe repeat in 2 hours if still having migraine. Max daily dose of 200mg  per day. (Patient taking differently: Take 50 mg by mouth every 2 (two) hours as needed for migraine or headache. Take 0.5 tablet by mouth at onset of headach. ,Maybe repeat in 2 hours if still having migraine. Max daily dose of 200mg  per day.) 27 tablet 1   No current facility-administered medications for this visit.    REVIEW OF SYSTEMS:   Constitutional: Denies fevers, chills or abnormal night sweats Eyes: Denies blurriness of  vision, double vision or watery eyes Ears, nose, mouth, throat, and face: Denies mucositis or sore throat Respiratory: Denies cough, dyspnea or wheezes Cardiovascular: Denies palpitation, chest discomfort or lower extremity swelling Gastrointestinal:  Denies nausea, heartburn or change in bowel habits Skin: Denies abnormal skin rashes Lymphatics: Denies new lymphadenopathy or easy bruising Neurological:Denies numbness, tingling or new weaknesses Behavioral/Psych: Mood is stable, no new changes  All other systems were reviewed with the patient and are negative.  PHYSICAL EXAMINATION: ECOG PERFORMANCE STATUS: {CHL ONC ECOG PS:[id number]}  There were no vitals filed for this visit. There were no vitals filed for this visit.  GENERAL:alert, no distress and comfortable SKIN: skin color, texture, turgor are normal, no rashes or significant lesions EYES: normal, conjunctiva are pink and non-injected, sclera clear OROPHARYNX:no exudate, no erythema  and lips, buccal mucosa, and tongue normal  NECK: supple, thyroid normal size, non-tender, without nodularity LYMPH:  no palpable lymphadenopathy in the cervical, axillary or inguinal LUNGS: clear to auscultation and percussion with normal breathing effort HEART: regular rate & rhythm and no murmurs and no lower extremity edema ABDOMEN:abdomen soft, non-tender and normal bowel sounds Musculoskeletal:no cyanosis of digits and no clubbing  PSYCH: alert & oriented x 3 with fluent speech NEURO: no focal motor/sensory deficits  LABORATORY DATA:  I have reviewed the data as listed Lab Results  Component Value Date   WBC 6.5 10/12/2022   HGB 14.9 10/12/2022   HCT 45.1 10/12/2022   MCV 85.9 10/12/2022   PLT 118 (L) 10/12/2022   Recent Labs    06/25/22 0930 09/06/22 0907 10/01/22 0915  NA 134* 136 136  K 3.0* 3.4* 3.7  CL 104 100 101  CO2 20* 27 26  GLUCOSE 172* 148* 106*  BUN 13 14 11   CREATININE 0.77 0.52 0.57  CALCIUM 8.6* 9.8 9.3  GFRNONAA >60 >60 >60  PROT 7.8 8.0 8.0  ALBUMIN 3.9 4.5 4.1  AST 29 29 38  ALT 24 27 33  ALKPHOS 42 37* 35*  BILITOT 2.3* 2.4* 2.1*    RADIOGRAPHIC STUDIES: I have personally reviewed the radiological images as listed and agreed with the findings in the report. No results found.

## 2022-11-02 NOTE — Assessment & Plan Note (Addendum)
We reviewed the NCCN guidelines We discussed the role of chemotherapy. The intent is of curative intent.  We discussed some of the risks, benefits, side-effects of carboplatin & Taxol. Treatment is intravenous, every 3 weeks x 6 cycles  Some of the short term side-effects included, though not limited to, including weight loss, life threatening infections, risk of allergic reactions, need for transfusions of blood products, nausea, vomiting, change in bowel habits, loss of hair, admission to hospital for various reasons, and risks of death.   Long term side-effects are also discussed including risks of infertility, permanent damage to nerve function, hearing loss, chronic fatigue, kidney damage with possibility needing hemodialysis, and rare secondary malignancy including bone marrow disorders.  The patient is aware that the response rates discussed earlier is not guaranteed.  After a long discussion, patient made an informed decision to proceed with the prescribed plan of care.   Patient education material was dispensed. We discussed premedication with dexamethasone before chemotherapy. I recommend chemo education class and labs before starting on treatment I plan repeat imaging study after completion of 6 cycles of chemo She has good venous access and would not need port placement

## 2022-11-02 NOTE — Assessment & Plan Note (Signed)
We discussed risks of hyperglycemia while on treatment I recommend strict dietary modification while on treatment

## 2022-11-04 ENCOUNTER — Other Ambulatory Visit (HOSPITAL_COMMUNITY): Payer: Self-pay

## 2022-11-04 DIAGNOSIS — E669 Obesity, unspecified: Secondary | ICD-10-CM | POA: Diagnosis not present

## 2022-11-04 DIAGNOSIS — L4050 Arthropathic psoriasis, unspecified: Secondary | ICD-10-CM | POA: Diagnosis not present

## 2022-11-04 DIAGNOSIS — G5601 Carpal tunnel syndrome, right upper limb: Secondary | ICD-10-CM | POA: Diagnosis not present

## 2022-11-04 DIAGNOSIS — L409 Psoriasis, unspecified: Secondary | ICD-10-CM | POA: Diagnosis not present

## 2022-11-04 DIAGNOSIS — M2550 Pain in unspecified joint: Secondary | ICD-10-CM | POA: Diagnosis not present

## 2022-11-04 DIAGNOSIS — Z6834 Body mass index (BMI) 34.0-34.9, adult: Secondary | ICD-10-CM | POA: Diagnosis not present

## 2022-11-04 DIAGNOSIS — R5383 Other fatigue: Secondary | ICD-10-CM | POA: Diagnosis not present

## 2022-11-09 ENCOUNTER — Encounter: Payer: Self-pay | Admitting: Hematology and Oncology

## 2022-11-09 ENCOUNTER — Inpatient Hospital Stay: Payer: Commercial Managed Care - PPO | Admitting: Hematology and Oncology

## 2022-11-09 ENCOUNTER — Other Ambulatory Visit (HOSPITAL_COMMUNITY): Payer: Self-pay

## 2022-11-09 VITALS — BP 130/86 | HR 64 | Temp 97.9°F | Resp 18 | Ht 69.0 in | Wt 233.2 lb

## 2022-11-09 DIAGNOSIS — E119 Type 2 diabetes mellitus without complications: Secondary | ICD-10-CM | POA: Diagnosis not present

## 2022-11-09 DIAGNOSIS — E669 Obesity, unspecified: Secondary | ICD-10-CM | POA: Diagnosis not present

## 2022-11-09 DIAGNOSIS — Z90710 Acquired absence of both cervix and uterus: Secondary | ICD-10-CM | POA: Diagnosis not present

## 2022-11-09 DIAGNOSIS — C561 Malignant neoplasm of right ovary: Secondary | ICD-10-CM | POA: Diagnosis not present

## 2022-11-09 DIAGNOSIS — K7460 Unspecified cirrhosis of liver: Secondary | ICD-10-CM

## 2022-11-09 DIAGNOSIS — Z90722 Acquired absence of ovaries, bilateral: Secondary | ICD-10-CM | POA: Diagnosis not present

## 2022-11-09 DIAGNOSIS — R161 Splenomegaly, not elsewhere classified: Secondary | ICD-10-CM

## 2022-11-09 MED ORDER — DEXAMETHASONE 4 MG PO TABS
ORAL_TABLET | ORAL | 6 refills | Status: DC
  Filled 2022-11-09: qty 24, 24d supply, fill #0
  Filled 2023-03-09: qty 24, 24d supply, fill #1

## 2022-11-09 NOTE — Assessment & Plan Note (Signed)
The calculated dose of chemotherapy comes back very high I plan to keep maximum dose of carboplatin at 750 mg and slight upfront dose reduction of paclitaxel

## 2022-11-09 NOTE — Assessment & Plan Note (Signed)
She has Gilbert's syndrome Observe closely

## 2022-11-09 NOTE — Assessment & Plan Note (Signed)
This is the cause of her thrombocytopenia Observe

## 2022-11-09 NOTE — Progress Notes (Signed)
START ON PATHWAY REGIMEN - Ovarian     A cycle is every 21 days:     Paclitaxel      Carboplatin   **Always confirm dose/schedule in your pharmacy ordering system**  Patient Characteristics: Postoperative without Neoadjuvant Therapy (Pathologic Staging), Newly Diagnosed, Adjuvant Therapy, Stage IC, Grade 1 or 2 BRCA Mutation Status: Awaiting Test Results Therapeutic Status: Postoperative without Neoadjuvant Therapy (Pathologic Staging) AJCC 8 Stage Grouping: IC AJCC M Category: cM0 AJCC T Category: pT1c AJCC N Category: pN0 Tumor Grade: 2 Intent of Therapy: Curative Intent, Discussed with Patient

## 2022-11-10 ENCOUNTER — Other Ambulatory Visit (HOSPITAL_COMMUNITY): Payer: Self-pay

## 2022-11-10 ENCOUNTER — Other Ambulatory Visit: Payer: Self-pay

## 2022-11-10 ENCOUNTER — Other Ambulatory Visit: Payer: Self-pay | Admitting: *Deleted

## 2022-11-10 ENCOUNTER — Encounter: Payer: Self-pay | Admitting: Hematology and Oncology

## 2022-11-10 ENCOUNTER — Inpatient Hospital Stay: Payer: Commercial Managed Care - PPO

## 2022-11-10 DIAGNOSIS — C561 Malignant neoplasm of right ovary: Secondary | ICD-10-CM

## 2022-11-10 MED ORDER — ONDANSETRON HCL 8 MG PO TABS
8.0000 mg | ORAL_TABLET | Freq: Three times a day (TID) | ORAL | 1 refills | Status: DC | PRN
Start: 2022-11-10 — End: 2023-05-12
  Filled 2022-11-10: qty 30, 10d supply, fill #0

## 2022-11-10 MED ORDER — PROCHLORPERAZINE MALEATE 10 MG PO TABS
10.0000 mg | ORAL_TABLET | Freq: Four times a day (QID) | ORAL | 1 refills | Status: DC | PRN
Start: 2022-11-10 — End: 2023-05-12
  Filled 2022-11-10: qty 30, 8d supply, fill #0

## 2022-11-11 ENCOUNTER — Other Ambulatory Visit: Payer: Self-pay

## 2022-11-11 ENCOUNTER — Other Ambulatory Visit: Payer: Self-pay | Admitting: Hematology and Oncology

## 2022-11-11 ENCOUNTER — Telehealth: Payer: Self-pay | Admitting: Oncology

## 2022-11-11 DIAGNOSIS — C561 Malignant neoplasm of right ovary: Secondary | ICD-10-CM

## 2022-11-11 NOTE — Telephone Encounter (Signed)
Patient's husband, Judie Grieve, called and wanted to let us know that Anamta is going to use the Dignicap for treatments.  The have a family member that would like to pay for it anonymously.  Advised Farin will need to set up the Dignicap (kit and cards) on the website and to bring them to her first infusion.  Discussed that he could put in the payment information on the website for the family member.  He verbalized understanding and agreement.

## 2022-11-13 ENCOUNTER — Encounter (HOSPITAL_COMMUNITY): Payer: Self-pay

## 2022-11-15 ENCOUNTER — Other Ambulatory Visit: Payer: Self-pay

## 2022-11-15 ENCOUNTER — Other Ambulatory Visit (HOSPITAL_COMMUNITY): Payer: Self-pay

## 2022-11-15 ENCOUNTER — Inpatient Hospital Stay: Payer: Commercial Managed Care - PPO

## 2022-11-15 DIAGNOSIS — N838 Other noninflammatory disorders of ovary, fallopian tube and broad ligament: Secondary | ICD-10-CM

## 2022-11-15 DIAGNOSIS — C561 Malignant neoplasm of right ovary: Secondary | ICD-10-CM

## 2022-11-15 DIAGNOSIS — Z90710 Acquired absence of both cervix and uterus: Secondary | ICD-10-CM | POA: Diagnosis not present

## 2022-11-15 DIAGNOSIS — Z90722 Acquired absence of ovaries, bilateral: Secondary | ICD-10-CM | POA: Diagnosis not present

## 2022-11-15 LAB — CBC WITH DIFFERENTIAL (CANCER CENTER ONLY)
Abs Immature Granulocytes: 0.02 10*3/uL (ref 0.00–0.07)
Basophils Absolute: 0.1 10*3/uL (ref 0.0–0.1)
Basophils Relative: 1 %
Eosinophils Absolute: 0.2 10*3/uL (ref 0.0–0.5)
Eosinophils Relative: 3 %
HCT: 45 % (ref 36.0–46.0)
Hemoglobin: 15.6 g/dL — ABNORMAL HIGH (ref 12.0–15.0)
Immature Granulocytes: 0 %
Lymphocytes Relative: 25 %
Lymphs Abs: 2 10*3/uL (ref 0.7–4.0)
MCH: 29.2 pg (ref 26.0–34.0)
MCHC: 34.7 g/dL (ref 30.0–36.0)
MCV: 84.1 fL (ref 80.0–100.0)
Monocytes Absolute: 0.6 10*3/uL (ref 0.1–1.0)
Monocytes Relative: 8 %
Neutro Abs: 5.1 10*3/uL (ref 1.7–7.7)
Neutrophils Relative %: 63 %
Platelet Count: 112 10*3/uL — ABNORMAL LOW (ref 150–400)
RBC: 5.35 MIL/uL — ABNORMAL HIGH (ref 3.87–5.11)
RDW: 13.1 % (ref 11.5–15.5)
Smear Review: NORMAL
WBC Count: 8 10*3/uL (ref 4.0–10.5)
nRBC: 0 % (ref 0.0–0.2)

## 2022-11-15 LAB — CMP (CANCER CENTER ONLY)
ALT: 39 U/L (ref 0–44)
AST: 41 U/L (ref 15–41)
Albumin: 4.7 g/dL (ref 3.5–5.0)
Alkaline Phosphatase: 49 U/L (ref 38–126)
Anion gap: 8 (ref 5–15)
BUN: 16 mg/dL (ref 6–20)
CO2: 28 mmol/L (ref 22–32)
Calcium: 10 mg/dL (ref 8.9–10.3)
Chloride: 99 mmol/L (ref 98–111)
Creatinine: 0.6 mg/dL (ref 0.44–1.00)
GFR, Estimated: >60 mL/min (ref >60)
Glucose, Bld: 95 mg/dL (ref 70–99)
Potassium: 3.9 mmol/L (ref 3.5–5.1)
Sodium: 135 mmol/L (ref 135–145)
Total Bilirubin: 2.6 mg/dL — ABNORMAL HIGH (ref 0.3–1.2)
Total Protein: 8.5 g/dL — ABNORMAL HIGH (ref 6.5–8.1)

## 2022-11-15 MED ORDER — SUMATRIPTAN SUCCINATE 100 MG PO TABS
100.0000 mg | ORAL_TABLET | ORAL | 3 refills | Status: AC
  Filled 2022-11-15: qty 9, 30d supply, fill #0
  Filled 2023-04-20: qty 9, 30d supply, fill #1
  Filled 2023-07-28: qty 9, 30d supply, fill #2
  Filled 2023-10-26: qty 9, 30d supply, fill #3
  Filled 2023-11-15: qty 9, 30d supply, fill #4

## 2022-11-16 LAB — CANCER ANTIGEN 19-9: CA 19-9: 28 U/mL (ref 0–35)

## 2022-11-17 MED FILL — Dexamethasone Sodium Phosphate Inj 100 MG/10ML: INTRAMUSCULAR | Qty: 1 | Status: AC

## 2022-11-17 MED FILL — Fosaprepitant Dimeglumine For IV Infusion 150 MG (Base Eq): INTRAVENOUS | Qty: 5 | Status: AC

## 2022-11-18 ENCOUNTER — Inpatient Hospital Stay: Payer: Commercial Managed Care - PPO

## 2022-11-18 ENCOUNTER — Other Ambulatory Visit (HOSPITAL_COMMUNITY): Payer: Self-pay

## 2022-11-18 VITALS — BP 135/85 | HR 91 | Temp 97.6°F | Resp 16

## 2022-11-18 DIAGNOSIS — C561 Malignant neoplasm of right ovary: Secondary | ICD-10-CM

## 2022-11-18 DIAGNOSIS — Z90722 Acquired absence of ovaries, bilateral: Secondary | ICD-10-CM | POA: Diagnosis not present

## 2022-11-18 DIAGNOSIS — Z90710 Acquired absence of both cervix and uterus: Secondary | ICD-10-CM | POA: Diagnosis not present

## 2022-11-18 MED ORDER — PALONOSETRON HCL INJECTION 0.25 MG/5ML
0.2500 mg | Freq: Once | INTRAVENOUS | Status: AC
  Administered 2022-11-18: 0.25 mg via INTRAVENOUS
  Filled 2022-11-18: qty 5

## 2022-11-18 MED ORDER — SODIUM CHLORIDE 0.9 % IV SOLN
140.0000 mg/m2 | Freq: Once | INTRAVENOUS | Status: AC
  Administered 2022-11-18: 318 mg via INTRAVENOUS
  Filled 2022-11-18: qty 53

## 2022-11-18 MED ORDER — FAMOTIDINE IN NACL 20-0.9 MG/50ML-% IV SOLN
20.0000 mg | Freq: Once | INTRAVENOUS | Status: AC
  Administered 2022-11-18: 20 mg via INTRAVENOUS
  Filled 2022-11-18: qty 50

## 2022-11-18 MED ORDER — SODIUM CHLORIDE 0.9 % IV SOLN
10.0000 mg | Freq: Once | INTRAVENOUS | Status: AC
  Administered 2022-11-18: 10 mg via INTRAVENOUS
  Filled 2022-11-18: qty 10

## 2022-11-18 MED ORDER — SODIUM CHLORIDE 0.9 % IV SOLN
750.0000 mg | Freq: Once | INTRAVENOUS | Status: AC
  Administered 2022-11-18: 750 mg via INTRAVENOUS
  Filled 2022-11-18: qty 75

## 2022-11-18 MED ORDER — CETIRIZINE HCL 10 MG/ML IV SOLN
10.0000 mg | Freq: Once | INTRAVENOUS | Status: AC
  Administered 2022-11-18: 10 mg via INTRAVENOUS
  Filled 2022-11-18: qty 1

## 2022-11-18 MED ORDER — HEPARIN SOD (PORK) LOCK FLUSH 100 UNIT/ML IV SOLN: 500.0000 [IU] | Freq: Once | INTRAVENOUS | Status: DC | PRN

## 2022-11-18 MED ORDER — SODIUM CHLORIDE 0.9% FLUSH: 10.0000 mL | INTRAVENOUS | Status: DC | PRN

## 2022-11-18 MED ORDER — SODIUM CHLORIDE 0.9 % IV SOLN: Freq: Once | INTRAVENOUS | Status: AC

## 2022-11-18 MED ORDER — SODIUM CHLORIDE 0.9 % IV SOLN
150.0000 mg | Freq: Once | INTRAVENOUS | Status: AC
  Administered 2022-11-18: 150 mg via INTRAVENOUS
  Filled 2022-11-18: qty 150

## 2022-11-18 NOTE — Patient Instructions (Signed)
[facility] [facility]  Discharge Instructions: Thank you for choosing [facility] to provide your oncology and hematology care.   If you have a lab appointment with the Cancer Center, please go directly to the Cancer Center and check in at the registration area.   Wear comfortable clothing and clothing appropriate for easy access to any Portacath or PICC line.   We strive to give you quality time with your provider. You may need to reschedule your appointment if you arrive late (15 or more minutes).  Arriving late affects you and other patients whose appointments are after yours.  Also, if you miss three or more appointments without notifying the office, you may be dismissed from the clinic at the provider's discretion.      For prescription refill requests, have your pharmacy contact our office and allow 72 hours for refills to be completed.    Today you received the following chemotherapy and/or immunotherapy agents: Paclitaxel (Taxol), Carboplatin      To help prevent nausea and vomiting after your treatment, we encourage you to take your nausea medication as directed.  BELOW ARE SYMPTOMS THAT SHOULD BE REPORTED IMMEDIATELY: *FEVER GREATER THAN 100.4 F (38 C) OR HIGHER *CHILLS OR SWEATING *NAUSEA AND VOMITING THAT IS NOT CONTROLLED WITH YOUR NAUSEA MEDICATION *UNUSUAL SHORTNESS OF BREATH *UNUSUAL BRUISING OR BLEEDING *URINARY PROBLEMS (pain or burning when urinating, or frequent urination) *BOWEL PROBLEMS (unusual diarrhea, constipation, pain near the anus) TENDERNESS IN MOUTH AND THROAT WITH OR WITHOUT PRESENCE OF ULCERS (sore throat, sores in mouth, or a toothache) UNUSUAL RASH, SWELLING OR PAIN  UNUSUAL VAGINAL DISCHARGE OR ITCHING   Items with * indicate a potential emergency and should be followed up as soon as possible or go to the Emergency Department if any problems should occur.  Please show the CHEMOTHERAPY ALERT CARD or  IMMUNOTHERAPY ALERT CARD at check-in to the Emergency Department and triage nurse.  Should you have questions after your visit or need to cancel or reschedule your appointment, please contact [place] [facility] [facility]  Dept: [phone]  and follow the prompts.  Office hours are 8:00 a.m. to 4:30 p.m. Monday - Friday. Please note that voicemails left after 4:00 p.m. may not be returned until the following business day.  We are closed weekends and major holidays. You have access to a nurse at all times for urgent questions. Please call the main number to the clinic Dept: [phone] and follow the prompts.   For any non-urgent questions, you may also contact your provider using MyChart. We now offer e-Visits for anyone 61 and older to request care online for non-urgent symptoms. For details visit [email or web address].   Also download the MyChart app! Go to the app store, search "MyChart", open the app, select [place], and log in with your MyChart username and password.  Paclitaxel Injection What is this medication? PACLITAXEL (PAK li TAX el) treats some types of cancer. It works by slowing down the growth of cancer cells. This medicine may be used for other purposes; ask your health care provider or pharmacist if you have questions. COMMON BRAND NAME(S): Onxol, Taxol What should I tell my care team before I take this medication? They need to know if you have any of these conditions: Heart disease Liver disease Low white blood cell levels An unusual or allergic reaction to paclitaxel, other medications, foods, dyes, or preservatives If you or your partner are pregnant or trying  to get pregnant Breast-feeding How should I use this medication? This medication is injected into a vein. It is given by your care team in a hospital or clinic setting. Talk to your care team about the use of this medication in children. While it may be given to children for selected  conditions, precautions do apply. Overdosage: If you think you have taken too much of this medicine contact a poison control center or emergency room at once. NOTE: This medicine is only for you. Do not share this medicine with others. What if I miss a dose? Keep appointments for follow-up doses. It is important not to miss your dose. Call your care team if you are unable to keep an appointment. What may interact with this medication? Do not take this medication with any of the following: Live virus vaccines Other medications may affect the way this medication works. Talk with your care team about all of the medications you take. They may suggest changes to your treatment plan to lower the risk of side effects and to make sure your medications work as intended. This list may not describe all possible interactions. Give your health care provider a list of all the medicines, herbs, non-prescription drugs, or dietary supplements you use. Also tell them if you smoke, drink alcohol, or use illegal drugs. Some items may interact with your medicine. What should I watch for while using this medication? Your condition will be monitored carefully while you are receiving this medication. You may need blood work while taking this medication. This medication may make you feel generally unwell. This is not uncommon as chemotherapy can affect healthy cells as well as cancer cells. Report any side effects. Continue your course of treatment even though you feel ill unless your care team tells you to stop. This medication can cause serious allergic reactions. To reduce the risk, your care team may give you other medications to take before receiving this one. Be sure to follow the directions from your care team. This medication may increase your risk of getting an infection. Call your care team for advice if you get a fever, chills, sore throat, or other symptoms of a cold or flu. Do not treat yourself. Try to avoid  being around people who are sick. This medication may increase your risk to bruise or bleed. Call your care team if you notice any unusual bleeding. Be careful brushing or flossing your teeth or using a toothpick because you may get an infection or bleed more easily. If you have any dental work done, tell your dentist you are receiving this medication. Talk to your care team if you may be pregnant. Serious birth defects can occur if you take this medication during pregnancy. Talk to your care team before breastfeeding. Changes to your treatment plan may be needed. What side effects may I notice from receiving this medication? Side effects that you should report to your care team as soon as possible: Allergic reactions--skin rash, itching, hives, swelling of the face, lips, tongue, or throat Heart rhythm changes--fast or irregular heartbeat, dizziness, feeling faint or lightheaded, chest pain, trouble breathing Increase in blood pressure Infection--fever, chills, cough, sore throat, wounds that don't heal, pain or trouble when passing urine, general feeling of discomfort or being unwell Low blood pressure--dizziness, feeling faint or lightheaded, blurry vision Low red blood cell level--unusual weakness or fatigue, dizziness, headache, trouble breathing Painful swelling, warmth, or redness of the skin, blisters or sores at the infusion site Pain, tingling, or  numbness in the hands or feet Slow heartbeat--dizziness, feeling faint or lightheaded, confusion, trouble breathing, unusual weakness or fatigue Unusual bruising or bleeding Side effects that usually do not require medical attention (report to your care team if they continue or are bothersome): Diarrhea Hair loss Joint pain Loss of appetite Muscle pain Nausea Vomiting This list may not describe all possible side effects. Call your doctor for medical advice about side effects. You may report side effects to FDA at 1-800-FDA-1088. Where  should I keep my medication? This medication is given in a hospital or clinic. It will not be stored at home. NOTE: This sheet is a summary. It may not cover all possible information. If you have questions about this medicine, talk to your doctor, pharmacist, or health care provider.  2024 Elsevier/Gold Standard (2021-06-30 00:00:00)  Carboplatin Injection What is this medication? CARBOPLATIN (KAR boe pla tin) treats some types of cancer. It works by slowing down the growth of cancer cells. This medicine may be used for other purposes; ask your health care provider or pharmacist if you have questions. COMMON BRAND NAME(S): Paraplatin What should I tell my care team before I take this medication? They need to know if you have any of these conditions: Blood disorders Hearing problems Kidney disease Recent or ongoing radiation therapy An unusual or allergic reaction to carboplatin, cisplatin, other medications, foods, dyes, or preservatives Pregnant or trying to get pregnant Breast-feeding How should I use this medication? This medication is injected into a vein. It is given by your care team in a hospital or clinic setting. Talk to your care team about the use of this medication in children. Special care may be needed. Overdosage: If you think you have taken too much of this medicine contact a poison control center or emergency room at once. NOTE: This medicine is only for you. Do not share this medicine with others. What if I miss a dose? Keep appointments for follow-up doses. It is important not to miss your dose. Call your care team if you are unable to keep an appointment. What may interact with this medication? Medications for seizures Some antibiotics, such as amikacin, gentamicin, neomycin, streptomycin, tobramycin Vaccines This list may not describe all possible interactions. Give your health care provider a list of all the medicines, herbs, non-prescription drugs, or dietary  supplements you use. Also tell them if you smoke, drink alcohol, or use illegal drugs. Some items may interact with your medicine. What should I watch for while using this medication? Your condition will be monitored carefully while you are receiving this medication. You may need blood work while taking this medication. This medication may make you feel generally unwell. This is not uncommon, as chemotherapy can affect healthy cells as well as cancer cells. Report any side effects. Continue your course of treatment even though you feel ill unless your care team tells you to stop. In some cases, you may be given additional medications to help with side effects. Follow all directions for their use. This medication may increase your risk of getting an infection. Call your care team for advice if you get a fever, chills, sore throat, or other symptoms of a cold or flu. Do not treat yourself. Try to avoid being around people who are sick. Avoid taking medications that contain aspirin, acetaminophen, ibuprofen, naproxen, or ketoprofen unless instructed by your care team. These medications may hide a fever. Be careful brushing or flossing your teeth or using a toothpick because you may get  an infection or bleed more easily. If you have any dental work done, tell your dentist you are receiving this medication. Talk to your care team if you wish to become pregnant or think you might be pregnant. This medication can cause serious birth defects. Talk to your care team about effective forms of contraception. Do not breast-feed while taking this medication. What side effects may I notice from receiving this medication? Side effects that you should report to your care team as soon as possible: Allergic reactions--skin rash, itching, hives, swelling of the face, lips, tongue, or throat Infection--fever, chills, cough, sore throat, wounds that don't heal, pain or trouble when passing urine, general feeling of  discomfort or being unwell Low red blood cell level--unusual weakness or fatigue, dizziness, headache, trouble breathing Pain, tingling, or numbness in the hands or feet, muscle weakness, change in vision, confusion or trouble speaking, loss of balance or coordination, trouble walking, seizures Unusual bruising or bleeding Side effects that usually do not require medical attention (report to your care team if they continue or are bothersome): Hair loss Nausea Unusual weakness or fatigue Vomiting This list may not describe all possible side effects. Call your doctor for medical advice about side effects. You may report side effects to FDA at 1-800-FDA-1088. Where should I keep my medication? This medication is given in a hospital or clinic. It will not be stored at home. NOTE: This sheet is a summary. It may not cover all possible information. If you have questions about this medicine, talk to your doctor, pharmacist, or health care provider.  2024 Elsevier/Gold Standard (2021-06-02 00:00:00)

## 2022-11-19 ENCOUNTER — Telehealth: Payer: Self-pay | Admitting: *Deleted

## 2022-11-19 ENCOUNTER — Other Ambulatory Visit: Payer: Self-pay

## 2022-11-19 NOTE — Telephone Encounter (Signed)
-----   Message from Nurse Lanora Manis A sent at 11/18/2022  3:17 PM EDT ----- Regarding: First time 11/18/22- First time dignicap- Taxol(3hour)/Carboplatin. Tolerated well. Dr. Bertis Ruddy

## 2022-11-19 NOTE — Telephone Encounter (Signed)
Called pt to see how she did with her treatment & she reports doing well.  She took her nausea med once & it it helped.  She denies any other problems.  She knows how to reach [place] & knows her next appts.

## 2022-11-22 ENCOUNTER — Other Ambulatory Visit: Payer: Self-pay

## 2022-11-22 ENCOUNTER — Encounter: Payer: Self-pay | Admitting: Hematology and Oncology

## 2022-11-23 ENCOUNTER — Telehealth: Payer: Self-pay | Admitting: Oncology

## 2022-11-23 ENCOUNTER — Other Ambulatory Visit (HOSPITAL_COMMUNITY): Payer: Self-pay

## 2022-11-23 ENCOUNTER — Other Ambulatory Visit: Payer: Self-pay | Admitting: Hematology and Oncology

## 2022-11-23 ENCOUNTER — Other Ambulatory Visit: Payer: Self-pay

## 2022-11-23 MED ORDER — OXYCODONE HCL 10 MG PO TABS
10.0000 mg | ORAL_TABLET | Freq: Four times a day (QID) | ORAL | 0 refills | Status: DC | PRN
Start: 2022-11-23 — End: 2023-02-02
  Filled 2022-11-23: qty 30, 8d supply, fill #0

## 2022-11-23 NOTE — Telephone Encounter (Signed)
Called Judie Grieve back and advised him to have Lexy take Claritin daily and that Dr. Bertis Ruddy has sent in oxycodone to her pharmacy and she can take it every 6 hours as needed with OTC tylenol.  He verbalized understanding and agreement.

## 2022-11-23 NOTE — Progress Notes (Signed)
Paused [facility] for [facility] therapy until January.

## 2022-11-23 NOTE — Telephone Encounter (Signed)
1) add claritin daily 2) I will send oxycodone 10 mg to her pharmacy, take every 6 hours as needed with OTC tylenol

## 2022-11-23 NOTE — Telephone Encounter (Signed)
Judie Grieve (spouse) called and said Farzana starting having aching/burning pain in both legs on Saturday.  It is hurting most in her knees and shins and is rating it at a 7/10.  She has tried  a half tablet of oxycodone/acetaminophen 10/325 mg that she had left over from a previous procedure (has 6 left) a couple of times.  It has taken the edge of the pain but is making her sick to her stomach. She denies having any redness or swelling of her legs and reports that she is eating and drinking normally. She is wondering if Dr. Bertis Ruddy has any recommendations for how to manage the pain.  Also gave Judie Grieve the [facility] nurses phone numbers.  They had called to ask for more information on his paperwork.

## 2022-11-24 ENCOUNTER — Other Ambulatory Visit (HOSPITAL_COMMUNITY): Payer: Self-pay

## 2022-11-24 ENCOUNTER — Telehealth: Payer: Self-pay | Admitting: Oncology

## 2022-11-24 ENCOUNTER — Other Ambulatory Visit: Payer: Self-pay | Admitting: Hematology and Oncology

## 2022-11-24 ENCOUNTER — Inpatient Hospital Stay: Payer: Commercial Managed Care - PPO | Attending: Psychiatry

## 2022-11-24 DIAGNOSIS — L4050 Arthropathic psoriasis, unspecified: Secondary | ICD-10-CM | POA: Insufficient documentation

## 2022-11-24 DIAGNOSIS — D696 Thrombocytopenia, unspecified: Secondary | ICD-10-CM | POA: Insufficient documentation

## 2022-11-24 DIAGNOSIS — R161 Splenomegaly, not elsewhere classified: Secondary | ICD-10-CM | POA: Diagnosis not present

## 2022-11-24 DIAGNOSIS — Z5111 Encounter for antineoplastic chemotherapy: Secondary | ICD-10-CM | POA: Diagnosis not present

## 2022-11-24 DIAGNOSIS — K5903 Drug induced constipation: Secondary | ICD-10-CM | POA: Insufficient documentation

## 2022-11-24 DIAGNOSIS — R300 Dysuria: Secondary | ICD-10-CM

## 2022-11-24 DIAGNOSIS — L659 Nonscarring hair loss, unspecified: Secondary | ICD-10-CM | POA: Insufficient documentation

## 2022-11-24 DIAGNOSIS — C561 Malignant neoplasm of right ovary: Secondary | ICD-10-CM | POA: Insufficient documentation

## 2022-11-24 LAB — URINALYSIS, COMPLETE (UACMP) WITH MICROSCOPIC
Bilirubin Urine: NEGATIVE
Glucose, UA: NEGATIVE mg/dL
Ketones, ur: NEGATIVE mg/dL
Leukocytes,Ua: NEGATIVE
Nitrite: NEGATIVE
Protein, ur: NEGATIVE mg/dL
Specific Gravity, Urine: 1.01 (ref 1.005–1.030)
pH: 5 (ref 5.0–8.0)

## 2022-11-24 NOTE — Telephone Encounter (Signed)
Kirsten Rodriguez called and said she might have a UTI.  For the past couple of days, she has had some abdominal pain.  She has also had some clear discharge and can't tell if it is vaginal or urine.  She has noticed some pink discharge when wiping.  She denies having dysuria or urinary frequency.  She is able to come in for an UA/Culture today if needed.

## 2022-11-24 NOTE — Telephone Encounter (Signed)
Called Kirsten Rodriguez back and scheduled lab appointment for UA and culture at 11:00.  Advised her that we will call her with the results.

## 2022-11-24 NOTE — Telephone Encounter (Signed)
I created orders for UA and UCx Please schedule the lab appt for this, will call her with results

## 2022-11-25 ENCOUNTER — Telehealth: Payer: Self-pay

## 2022-11-25 LAB — URINE CULTURE

## 2022-11-25 NOTE — Telephone Encounter (Signed)
Called pt to let her know that her urine tests were not consistent with UTI, per Dr. Bertis Ruddy. Pt verbalized understanding.

## 2022-11-26 ENCOUNTER — Emergency Department (HOSPITAL_BASED_OUTPATIENT_CLINIC_OR_DEPARTMENT_OTHER): Payer: Commercial Managed Care - PPO

## 2022-11-26 ENCOUNTER — Telehealth: Payer: Self-pay

## 2022-11-26 ENCOUNTER — Emergency Department (HOSPITAL_BASED_OUTPATIENT_CLINIC_OR_DEPARTMENT_OTHER)
Admission: EM | Admit: 2022-11-26 | Discharge: 2022-11-26 | Disposition: A | Discharge status: Home / Self Care | Payer: Commercial Managed Care - PPO | Attending: Emergency Medicine | Admitting: Emergency Medicine

## 2022-11-26 ENCOUNTER — Encounter (HOSPITAL_BASED_OUTPATIENT_CLINIC_OR_DEPARTMENT_OTHER): Payer: Self-pay | Admitting: Emergency Medicine

## 2022-11-26 ENCOUNTER — Other Ambulatory Visit: Payer: Self-pay

## 2022-11-26 DIAGNOSIS — Z7984 Long term (current) use of oral hypoglycemic drugs: Secondary | ICD-10-CM | POA: Diagnosis not present

## 2022-11-26 DIAGNOSIS — R509 Fever, unspecified: Secondary | ICD-10-CM | POA: Insufficient documentation

## 2022-11-26 DIAGNOSIS — I10 Essential (primary) hypertension: Secondary | ICD-10-CM | POA: Diagnosis not present

## 2022-11-26 DIAGNOSIS — D1803 Hemangioma of intra-abdominal structures: Secondary | ICD-10-CM | POA: Diagnosis not present

## 2022-11-26 DIAGNOSIS — R161 Splenomegaly, not elsewhere classified: Secondary | ICD-10-CM | POA: Diagnosis not present

## 2022-11-26 DIAGNOSIS — R1084 Generalized abdominal pain: Secondary | ICD-10-CM | POA: Insufficient documentation

## 2022-11-26 DIAGNOSIS — E119 Type 2 diabetes mellitus without complications: Secondary | ICD-10-CM | POA: Diagnosis not present

## 2022-11-26 DIAGNOSIS — E876 Hypokalemia: Secondary | ICD-10-CM | POA: Diagnosis not present

## 2022-11-26 DIAGNOSIS — Z79899 Other long term (current) drug therapy: Secondary | ICD-10-CM | POA: Insufficient documentation

## 2022-11-26 DIAGNOSIS — Z20822 Contact with and (suspected) exposure to covid-19: Secondary | ICD-10-CM | POA: Diagnosis not present

## 2022-11-26 DIAGNOSIS — K8020 Calculus of gallbladder without cholecystitis without obstruction: Secondary | ICD-10-CM | POA: Diagnosis not present

## 2022-11-26 DIAGNOSIS — R059 Cough, unspecified: Secondary | ICD-10-CM | POA: Diagnosis not present

## 2022-11-26 DIAGNOSIS — K7460 Unspecified cirrhosis of liver: Secondary | ICD-10-CM | POA: Diagnosis not present

## 2022-11-26 LAB — CBC WITH DIFFERENTIAL/PLATELET
Abs Immature Granulocytes: 0.01 10*3/uL (ref 0.00–0.07)
Basophils Absolute: 0 10*3/uL (ref 0.0–0.1)
Basophils Relative: 1 %
Eosinophils Absolute: 0 10*3/uL (ref 0.0–0.5)
Eosinophils Relative: 2 %
HCT: 36.8 % (ref 36.0–46.0)
Hemoglobin: 13 g/dL (ref 12.0–15.0)
Immature Granulocytes: 1 %
Lymphocytes Relative: 50 %
Lymphs Abs: 1.1 10*3/uL (ref 0.7–4.0)
MCH: 29.5 pg (ref 26.0–34.0)
MCHC: 35.3 g/dL (ref 30.0–36.0)
MCV: 83.4 fL (ref 80.0–100.0)
Monocytes Absolute: 0.2 10*3/uL (ref 0.1–1.0)
Monocytes Relative: 10 %
Neutro Abs: 0.8 10*3/uL — ABNORMAL LOW (ref 1.7–7.7)
Neutrophils Relative %: 36 %
Platelets: 84 10*3/uL — ABNORMAL LOW (ref 150–400)
RBC Morphology: NONE SEEN
RBC: 4.41 MIL/uL (ref 3.87–5.11)
RDW: 12.3 % (ref 11.5–15.5)
WBC: 2.2 10*3/uL — ABNORMAL LOW (ref 4.0–10.5)
nRBC: 0 % (ref 0.0–0.2)

## 2022-11-26 LAB — URINALYSIS, ROUTINE W REFLEX MICROSCOPIC
Bacteria, UA: NONE SEEN
Bilirubin Urine: NEGATIVE
Glucose, UA: NEGATIVE mg/dL
Ketones, ur: NEGATIVE mg/dL
Leukocytes,Ua: NEGATIVE
Nitrite: NEGATIVE
Protein, ur: NEGATIVE mg/dL
Specific Gravity, Urine: >1.046 — ABNORMAL HIGH (ref 1.005–1.030)
pH: 5.5 (ref 5.0–8.0)

## 2022-11-26 LAB — COMPREHENSIVE METABOLIC PANEL
ALT: 61 U/L — ABNORMAL HIGH (ref 0–44)
AST: 45 U/L — ABNORMAL HIGH (ref 15–41)
Albumin: 4.3 g/dL (ref 3.5–5.0)
Alkaline Phosphatase: 34 U/L — ABNORMAL LOW (ref 38–126)
Anion gap: 8 (ref 5–15)
BUN: 21 mg/dL — ABNORMAL HIGH (ref 6–20)
CO2: 34 mmol/L — ABNORMAL HIGH (ref 22–32)
Calcium: 9.7 mg/dL (ref 8.9–10.3)
Chloride: 91 mmol/L — ABNORMAL LOW (ref 98–111)
Creatinine, Ser: 0.73 mg/dL (ref 0.44–1.00)
GFR, Estimated: >60 mL/min (ref >60)
Glucose, Bld: 99 mg/dL (ref 70–99)
Potassium: 2.8 mmol/L — ABNORMAL LOW (ref 3.5–5.1)
Sodium: 133 mmol/L — ABNORMAL LOW (ref 135–145)
Total Bilirubin: 2.1 mg/dL — ABNORMAL HIGH (ref 0.3–1.2)
Total Protein: 7.3 g/dL (ref 6.5–8.1)

## 2022-11-26 LAB — LIPASE, BLOOD: Lipase: 60 U/L — ABNORMAL HIGH (ref 11–51)

## 2022-11-26 LAB — SARS CORONAVIRUS 2 BY RT PCR: SARS Coronavirus 2 by RT PCR: NEGATIVE

## 2022-11-26 MED ORDER — CIPROFLOXACIN HCL 500 MG PO TABS
500.0000 mg | ORAL_TABLET | Freq: Two times a day (BID) | ORAL | 0 refills | Status: AC
Start: 2022-11-26 — End: 2022-12-03

## 2022-11-26 MED ORDER — IOHEXOL 300 MG/ML  SOLN
100.0000 mL | Freq: Once | INTRAMUSCULAR | Status: AC | PRN
  Administered 2022-11-26: 100 mL via INTRAVENOUS

## 2022-11-26 MED ORDER — POTASSIUM CHLORIDE CRYS ER 20 MEQ PO TBCR
60.0000 meq | EXTENDED_RELEASE_TABLET | Freq: Once | ORAL | Status: AC
  Administered 2022-11-26: 60 meq via ORAL
  Filled 2022-11-26: qty 3

## 2022-11-26 NOTE — ED Notes (Signed)
Pt states "When I went to pee and when I wiped, there was blood dripping". RN notified.

## 2022-11-26 NOTE — ED Notes (Signed)
Report given to the next RN... 

## 2022-11-26 NOTE — Discharge Instructions (Addendum)
As we discussed, your work-up in the ER  was reassuring for acute findings. Laboratory evaluation, x-ray, and CT imaging did not reveal any emergent cause of your fever. However, given that you are on chemotherapy, I have talked to our oncologist on call who recommends that you be placed on a broad spectrum antibiotic to cover for any cause of infection.  I have placed this prescription for you and recommend that you fill and take it in its entirety.  Additionally, your potassium is low today.  I recommend that you consume foods high in potassium over the next few days and replace your water with fluids with electrolytes such as Gatorade or Pedialyte.  Please have this rechecked at your next appointment.  Return if development of any new or worsening symptoms.

## 2022-11-26 NOTE — ED Notes (Signed)
Pt aware of the need for a urine... Unable to currently provide the sample... 

## 2022-11-26 NOTE — Telephone Encounter (Signed)
Called pt and LVM to inform her that our [facility] clinic provider is unable to see her today. Per Dr. Bertis Ruddy, suggested pt go to ED at [facility] to be evaluated. This RN asked pt to please give [place] a call back to let us know that she received the VM. Call back number [phone] provided.

## 2022-11-26 NOTE — ED Provider Notes (Signed)
[place] EMERGENCY DEPARTMENT AT [facility] Provider Note   CSN: [id number] Arrival date & time: 11/26/22  1023     History  Chief Complaint  Patient presents with   Fever    Kirsten Rodriguez is a 47 y.o. female.  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  She states that she just received her first infusion of chemotherapy last Thursday.  She does not have a port.  Yesterday she began to feel generally unwell and reached out to her oncologist Dr. Bertis Ruddy who recommended that she come to the ER for evaluation.  She states that her Tmax was 101.2 yesterday. She last took tylenol at 1 am today and has not had a fever since then. She does continue to feel generally unwell and has some mild generalized abdominal pain.  Denies nausea, vomiting, or diarrhea.  She was seen a few days ago because she thought she had a urinary tract infection, however her urine was clean.  She states that she is no longer having dysuria.  Denies any cough, congestion, chest pain, or shortness of breath.  The history is provided by the patient. No language interpreter was used.  Fever      Home Medications Prior to Admission medications   Medication Sig Start Date End Date Taking? Authorizing Provider  Biotin 5000 MCG CAPS Take 5,000 mcg by mouth daily.    [provider]  clobetasol cream (TEMOVATE) 0.05 % Apply 1 Application topically 2 (two) times daily for 14 days Patient taking differently: Apply 1 Application topically 2 (two) times daily as needed (irritation). 07/14/22     dexamethasone (DECADRON) 4 MG tablet Take 2 tablets the night before and 2 tablets the morning of chemotherapy, every 3 weeks for 6 cycles 11/09/22   Artis Delay, MD  empagliflozin (JARDIANCE) 10 MG TABS tablet Take 1 tablet (10 mg total) by mouth every morning with or without food 07/24/21     glucose blood (FREESTYLE LITE)  test strip Use to check blood sugar 3-4 times a day 07/24/21     Ixekizumab (TALTZ) 80 MG/ML SOSY Inject 1 ml Subcutaneous Every 4 weeks 30 days Patient not taking: Reported on 10/28/2022 04/01/22   Quentin Angst, MD  Lancets (FREESTYLE) lancets Use to test blood sugar 3 - 4 times a day 07/24/21     losartan-hydrochlorothiazide (HYZAAR) 50-12.5 MG tablet Take 1 tablet by mouth daily. (Blood pressure goal is 130/80) 10/01/22     metFORMIN (GLUCOPHAGE) 1000 MG tablet Take 1 tablet by mouth 2 times daily with a meal. 01/13/22     Multiple Vitamins-Minerals (MULTIVITAMIN GUMMIES WOMENS PO) Take 2 each by mouth daily.    [provider]  ondansetron (ZOFRAN) 8 MG tablet Take 1 tablet (8 mg total) by mouth every 8 (eight) hours as needed for nausea or vomiting (Begin 3 days after carboplatin). 11/10/22   Artis Delay, MD  Oxycodone HCl 10 MG TABS Take 1 tablet (10 mg total) by mouth every 6 (six) hours as needed for severe pain. 11/23/22   Artis Delay, MD  prochlorperazine (COMPAZINE) 10 MG tablet Take 1 tablet (10 mg total) by mouth every 6 (six) hours as needed for nausea or vomiting. 11/10/22   Artis Delay, MD  rosuvastatin (CRESTOR) 5 MG tablet Take 1 tablet (5 mg total) by mouth 3 (three) times a week. 06/30/22     Semaglutide, 2 MG/DOSE, (OZEMPIC, 2 MG/DOSE,) 8 MG/3ML  SOPN Inject 2mg  under the skin once a week as directed. 04/22/21     SUMAtriptan (IMITREX) 100 MG tablet Take 1 tablet by mouth at onset of headach. ,Maybe repeat in 2 hours if still having migraine. Max daily dose of 200mg  per day. Patient taking differently: Take 50 mg by mouth every 2 (two) hours as needed for migraine or headache. Take 0.5 tablet by mouth at onset of headach. ,Maybe repeat in 2 hours if still having migraine. Max daily dose of 200mg  per day. 06/25/20     SUMAtriptan (IMITREX) 100 MG tablet Take 1 tablet at onset of headache, may be repeated in 2 hours if still having migraine (max daily dose 2 tablets) 11/15/22          Allergies    Patient has no known allergies.    Review of Systems   Review of Systems  Constitutional:  Positive for fever.  All other systems reviewed and are negative.   Physical Exam Updated Vital Signs BP 100/73   Pulse 98   Temp 99.1 F (37.3 C) (Oral)   Resp 16   Ht 5\' 9"  (1.753 m)   Wt 99.8 kg   SpO2 97%   BMI 32.49 kg/m  Physical Exam Vitals and nursing note reviewed.  Constitutional:      General: She is not in acute distress.    Appearance: Normal appearance. She is normal weight. She is not ill-appearing, toxic-appearing or diaphoretic.     Comments: Overall well-appearing  HENT:     Head: Normocephalic and atraumatic.  Neck:     Comments: No meningismus Cardiovascular:     Rate and Rhythm: Normal rate and regular rhythm.     Heart sounds: Normal heart sounds.  Pulmonary:     Effort: Pulmonary effort is normal. No respiratory distress.     Breath sounds: Normal breath sounds.  Abdominal:     General: Abdomen is flat.     Palpations: Abdomen is soft.     Tenderness: There is no right CVA tenderness, left CVA tenderness, guarding or rebound.     Comments: Mild generalized abdominal tenderness to palpation. Well healed incision sites noted  Musculoskeletal:        General: No tenderness. Normal range of motion.     Cervical back: Normal range of motion.     Right lower leg: No edema.     Left lower leg: No edema.  Skin:    General: Skin is warm and dry.  Neurological:     General: No focal deficit present.     Mental Status: She is alert.  Psychiatric:        Mood and Affect: Mood normal.        Behavior: Behavior normal.     ED Results / Procedures / Treatments   Labs (all labs ordered are listed, but only abnormal results are displayed) Labs Reviewed  CBC WITH DIFFERENTIAL/PLATELET - Abnormal; Notable for the following components:      Result Value   WBC 2.2 (*)    Platelets 84 (*)    Neutro Abs 0.8 (*)    All other components within  normal limits  COMPREHENSIVE METABOLIC PANEL - Abnormal; Notable for the following components:   Sodium 133 (*)    Potassium 2.8 (*)    Chloride 91 (*)    CO2 34 (*)    BUN 21 (*)    AST 45 (*)    ALT 61 (*)    Alkaline Phosphatase  34 (*)    Total Bilirubin 2.1 (*)    All other components within normal limits  LIPASE, BLOOD - Abnormal; Notable for the following components:   Lipase 60 (*)    All other components within normal limits  URINALYSIS, ROUTINE W REFLEX MICROSCOPIC - Abnormal; Notable for the following components:   Specific Gravity, Urine >1.046 (*)    Hgb urine dipstick TRACE (*)    All other components within normal limits  SARS CORONAVIRUS 2 BY RT PCR  CULTURE, BLOOD (ROUTINE X 2)  CULTURE, BLOOD (ROUTINE X 2)  URINE CULTURE    EKG None  Radiology CT ABDOMEN PELVIS W CONTRAST  Result Date: 11/26/2022 CLINICAL DATA:  Abdominal pain and fever for several days. Cirrhosis. Recent surgery for right ovarian neoplasm. EXAM: CT ABDOMEN AND PELVIS WITH CONTRAST TECHNIQUE: Multidetector CT imaging of the abdomen and pelvis was performed using the standard protocol following bolus administration of intravenous contrast. RADIATION DOSE REDUCTION: This exam was performed according to the departmental dose-optimization program which includes automated exposure control, adjustment of the mA and/or kV according to patient size and/or use of iterative reconstruction technique. CONTRAST:  OMNIPAQUE IOHEXOL 300 MG/ML  SOLN COMPARISON:  MRI on 09/24/2022 FINDINGS: Lower Chest: No acute findings. Hepatobiliary: Hepatic cirrhosis is again demonstrated. A 5 cm benign hemangioma is again seen in the posterior right hepatic lobe. No other hepatic masses are identified. Gallstones are seen, however there is no evidence of cholecystitis or biliary dilatation. Pancreas:  No mass or inflammatory changes. Spleen: Stable moderate splenomegaly, consistent with portal vein hypertension. No evidence  of ascites. Adrenals/Urinary Tract: No suspicious masses identified. No evidence of ureteral calculi or hydronephrosis. Unremarkable unopacified urinary bladder. Stomach/Bowel: No evidence of obstruction, inflammatory process or abnormal fluid collections. Normal appendix visualized. Vascular/Lymphatic: No pathologically enlarged lymph nodes. No acute vascular findings. Reproductive: Patient has undergone hysterectomy and resection of large right ovarian mass since previous study. No residual pelvic masses identified. No evidence of peritoneal or omental nodularity or ascites. Other:  None. Musculoskeletal:  No suspicious bone lesions identified. IMPRESSION: No acute findings. No evidence of recurrent carcinoma or metastatic disease. Hepatic cirrhosis. Stable 5 cm benign hemangioma in right hepatic lobe. Stable splenomegaly, consistent with portal venous hypertension. Cholelithiasis. No radiographic evidence of cholecystitis. Electronically Signed   By: Danae Orleans M.D.   On: 11/26/2022 15:43   DG Chest Port 1 View  Result Date: 11/26/2022 CLINICAL DATA:  Cough. EXAM: PORTABLE CHEST 1 VIEW COMPARISON:  06/25/2022. FINDINGS: Bilateral lung fields are clear. Bilateral lateral costophrenic angles are clear. Normal cardio-mediastinal silhouette. No acute osseous abnormalities. The soft tissues are within normal limits. IMPRESSION: No active disease. Electronically Signed   By: Jules Schick M.D.   On: 11/26/2022 13:36    Procedures Procedures    Medications Ordered in ED Medications  iohexol (OMNIPAQUE) 300 MG/ML solution 100 mL (100 mLs Intravenous Contrast Given 11/26/22 1403)  potassium chloride SA (KLOR-CON M) CR tablet 60 mEq (60 mEq Oral Given 11/26/22 1759)    ED Course/ Medical Decision Making/ A&P                                 Medical Decision Making Amount and/or Complexity of Data Reviewed Labs: ordered. Radiology: ordered.  Risk Prescription drug management.   This patient is a  47 y.o. female who presents to the ED for concern of fever in active chemo patient, this  involves an extensive number of treatment options, and is a complaint that carries with it a high risk of complications and morbidity. The emergent differential diagnosis prior to evaluation includes, but is not limited to,  sepsis, neutropenic fever, URI, UTI, appendicitis, diverticulitis . This is not an exhaustive differential.   Past Medical History / Co-morbidities / Social History:  has a past medical history of Diabetes mellitus without complication (HCC), Headache, Hypertension, PONV (postoperative nausea and vomiting), and Psoriatic arthritis (HCC).  Additional history: Chart reviewed. Pertinent results include: Sees oncology Dr. Bertis Ruddy, nursing staff recommended she be evaluated here today.  Had laparoscopic hysterectomy and bilateral oophorectomy on 10/12/2022.  Had first chemotherapy infusion on 11/18/2022.  Physical Exam: Physical exam performed. The pertinent findings include: well appearing, mild generalized abdominal tenderness to palpation without rebound or guarding.  Lab Tests: I ordered, and personally interpreted labs.  The pertinent results include:  COVID negative, WBC 2.2, neutrophils 0.8 (ANC 792), Na 133, K 2.8, chloride 91. Likely due to patients large amount of water intake. Lipase 60. Blood and urine cultures pending. UA noninfectious   Imaging Studies: I ordered imaging studies including CXR, CT abdomen pelvis. I independently visualized and interpreted imaging which showed   CXR: NAD  CT: No acute findings.   No evidence of recurrent carcinoma or metastatic disease.   Hepatic cirrhosis. Stable 5 cm benign hemangioma in right hepatic lobe.   Stable splenomegaly, consistent with portal venous hypertension.   Cholelithiasis. No radiographic evidence of cholecystitis.  I agree with the radiologist interpretation.   Medications: I ordered medication including oral  potassium  for hypokalemia. Reevaluation of the patient after these medicines showed that the patient stayed the same. I have reviewed the patients home medicines and have made adjustments as needed.  Consultations Obtained: I requested consultation with the oncology on call Dr. Myna Hidalgo,  and discussed lab and imaging findings as well as pertinent plan - they recommend: discharge with ciprofloxacin 500 mg BID x 7 days with close outpatient follow-up and return precautions   Disposition: After consideration of the diagnostic results and the patients response to treatment, I feel that emergency department workup does not suggest an emergent condition requiring admission or immediate intervention beyond what has been performed at this time. The plan is: discharge antibiotics and close outpatient follow-up with return precautions per oncology's recommendations. Discussed this with patient who is understanding and in agreement. Also discussed her hypokalemia, recommend eating foods high in potassium and drinking fluids with electrolytes and having her potassium rechecked at her next appointment.  Evaluation and diagnostic testing in the emergency department does not suggest an emergent condition requiring admission or immediate intervention beyond what has been performed at this time.  Plan for discharge with close PCP follow-up.  Patient is understanding and amenable with plan, educated on red flag symptoms that would prompt immediate return.  Patient discharged in stable condition.   I discussed this case with my attending physician Dr. Lynelle Doctor who cosigned this note including patient's presenting symptoms, physical exam, and planned diagnostics and interventions. Attending physician stated agreement with plan or made changes to plan which were implemented.    Final Clinical Impression(s) / ED Diagnoses Final diagnoses:  Fever, unspecified fever cause  Hypokalemia    Rx / DC Orders ED Discharge Orders           Ordered    ciprofloxacin (CIPRO) 500 MG tablet  Every 12 hours        11/26/22 1801  An After Visit Summary was printed and given to the patient.     [facility] Clock 11/26/22 Lyndal Pulley, MD 11/29/22 [phone]

## 2022-11-26 NOTE — ED Triage Notes (Signed)
Pt states she hasn't been feeling well the last couple of days, fever this am. Last Tylenol at 0100. Daughter has been sick recently.

## 2022-11-26 NOTE — Telephone Encounter (Signed)
Pt's husband reached out this morning stating that he had concerns for pt due to her temp reaching over 101 last night.   This RN called pt. Pt states that it was 101.2 last night, 100 after tylenol. Pt took temp again this morning around 0830 and it was 99.2 with last tylenol administration being 01:00 am.   Pt reports having aches and chills yesterday during the day. She states that she felt like she "couldn't get warm." Pt confirms she did have a "little bit of a cough" and a "little stomach pain" and headache. Pt denies congestion.   Pt reports what prompted her to be suspicious of a UTI recently was she noticed a "light red/light pink color when urinating" and wasn't sure if it was discharge or urine. Pt also confirms foul smelling urine. Pt denies increased frequency or pain during urination. She denies back pain.    This RN states that she will reach out to [facility] clinic and discuss with Dr. Bertis Ruddy next steps and call pt back. Pt verbalized understanding.

## 2022-11-26 NOTE — Telephone Encounter (Signed)
Pt returned phone call to this RN to inform us that she is headed to [facility] ED. This RN informs pt that Dr. Bertis Ruddy is concerned for possible neutropenic fever and we do not want to wait until after the weekend to be seen in our [facility] clinic. This RN advises pt to please call back once she is out of the ED and keep [place] updated on how she is feeling. Pt verbalized understanding.

## 2022-11-27 LAB — URINE CULTURE: Culture: <10000 — AB

## 2022-11-29 ENCOUNTER — Telehealth: Payer: Self-pay

## 2022-11-29 NOTE — Telephone Encounter (Signed)
Called to follow up with how she is dong today. Denies fever, no fever since Friday. She is able to eat and drink with no problems. She is feeling better since starting the antibiotic. Offered appt this afternoon with Dr. Bertis Ruddy. She declined the appt since she is feeling better.

## 2022-12-01 ENCOUNTER — Other Ambulatory Visit: Payer: Self-pay

## 2022-12-01 LAB — CULTURE, BLOOD (ROUTINE X 2)
Culture: NO GROWTH
Culture: NO GROWTH
Special Requests: ADEQUATE
Special Requests: ADEQUATE

## 2022-12-05 ENCOUNTER — Other Ambulatory Visit (HOSPITAL_COMMUNITY): Payer: Self-pay

## 2022-12-06 ENCOUNTER — Other Ambulatory Visit (HOSPITAL_COMMUNITY): Payer: Self-pay

## 2022-12-06 MED ORDER — OZEMPIC (2 MG/DOSE) 8 MG/3ML ~~LOC~~ SOPN
2.0000 mg | PEN_INJECTOR | SUBCUTANEOUS | 1 refills | Status: DC
  Filled 2022-12-06: qty 9, 84d supply, fill #0
  Filled 2023-03-08: qty 9, 84d supply, fill #1

## 2022-12-07 ENCOUNTER — Other Ambulatory Visit: Payer: Self-pay

## 2022-12-08 ENCOUNTER — Other Ambulatory Visit: Payer: Self-pay

## 2022-12-08 MED FILL — Fosaprepitant Dimeglumine For IV Infusion 150 MG (Base Eq): INTRAVENOUS | Qty: 5 | Status: AC

## 2022-12-08 MED FILL — Dexamethasone Sodium Phosphate Inj 100 MG/10ML: INTRAMUSCULAR | Qty: 1 | Status: AC

## 2022-12-09 ENCOUNTER — Encounter: Payer: Self-pay | Admitting: Hematology and Oncology

## 2022-12-09 ENCOUNTER — Inpatient Hospital Stay: Payer: Commercial Managed Care - PPO

## 2022-12-09 ENCOUNTER — Other Ambulatory Visit (HOSPITAL_COMMUNITY): Payer: Self-pay

## 2022-12-09 ENCOUNTER — Other Ambulatory Visit: Payer: Self-pay | Admitting: Hematology and Oncology

## 2022-12-09 ENCOUNTER — Inpatient Hospital Stay: Payer: Commercial Managed Care - PPO | Admitting: Hematology and Oncology

## 2022-12-09 VITALS — BP 114/80 | HR 82 | Temp 97.6°F | Resp 18 | Ht 69.0 in | Wt 220.0 lb

## 2022-12-09 DIAGNOSIS — C561 Malignant neoplasm of right ovary: Secondary | ICD-10-CM

## 2022-12-09 DIAGNOSIS — K5903 Drug induced constipation: Secondary | ICD-10-CM | POA: Diagnosis not present

## 2022-12-09 DIAGNOSIS — L659 Nonscarring hair loss, unspecified: Secondary | ICD-10-CM | POA: Diagnosis not present

## 2022-12-09 DIAGNOSIS — D696 Thrombocytopenia, unspecified: Secondary | ICD-10-CM | POA: Diagnosis not present

## 2022-12-09 DIAGNOSIS — Z5111 Encounter for antineoplastic chemotherapy: Secondary | ICD-10-CM | POA: Diagnosis not present

## 2022-12-09 DIAGNOSIS — L4050 Arthropathic psoriasis, unspecified: Secondary | ICD-10-CM | POA: Diagnosis not present

## 2022-12-09 DIAGNOSIS — R161 Splenomegaly, not elsewhere classified: Secondary | ICD-10-CM | POA: Diagnosis not present

## 2022-12-09 LAB — CBC WITH DIFFERENTIAL (CANCER CENTER ONLY)
Abs Immature Granulocytes: 0.2 10*3/uL — ABNORMAL HIGH (ref 0.00–0.07)
Basophils Absolute: 0 10*3/uL (ref 0.0–0.1)
Basophils Relative: 0 %
Eosinophils Absolute: 0 10*3/uL (ref 0.0–0.5)
Eosinophils Relative: 0 %
HCT: 36.2 % (ref 36.0–46.0)
Hemoglobin: 12.4 g/dL (ref 12.0–15.0)
Immature Granulocytes: 3 %
Lymphocytes Relative: 10 %
Lymphs Abs: 0.7 10*3/uL (ref 0.7–4.0)
MCH: 28.8 pg (ref 26.0–34.0)
MCHC: 34.3 g/dL (ref 30.0–36.0)
MCV: 84 fL (ref 80.0–100.0)
Monocytes Absolute: 0.1 10*3/uL (ref 0.1–1.0)
Monocytes Relative: 2 %
Neutro Abs: 5.9 10*3/uL (ref 1.7–7.7)
Neutrophils Relative %: 85 %
Platelet Count: 79 10*3/uL — ABNORMAL LOW (ref 150–400)
RBC: 4.31 MIL/uL (ref 3.87–5.11)
RDW: 11.9 % (ref 11.5–15.5)
WBC Count: 7 10*3/uL (ref 4.0–10.5)
nRBC: 0 % (ref 0.0–0.2)

## 2022-12-09 LAB — CMP (CANCER CENTER ONLY)
ALT: 31 U/L (ref 0–44)
AST: 34 U/L (ref 15–41)
Albumin: 4.5 g/dL (ref 3.5–5.0)
Alkaline Phosphatase: 46 U/L (ref 38–126)
Anion gap: 11 (ref 5–15)
BUN: 19 mg/dL (ref 6–20)
CO2: 25 mmol/L (ref 22–32)
Calcium: 9.7 mg/dL (ref 8.9–10.3)
Chloride: 102 mmol/L (ref 98–111)
Creatinine: 1.1 mg/dL — ABNORMAL HIGH (ref 0.44–1.00)
GFR, Estimated: >60 mL/min (ref >60)
Glucose, Bld: 195 mg/dL — ABNORMAL HIGH (ref 70–99)
Potassium: 3.5 mmol/L (ref 3.5–5.1)
Sodium: 138 mmol/L (ref 135–145)
Total Bilirubin: 1 mg/dL (ref 0.3–1.2)
Total Protein: 8.1 g/dL (ref 6.5–8.1)

## 2022-12-09 MED ORDER — SODIUM CHLORIDE 0.9 % IV SOLN
140.0000 mg/m2 | Freq: Once | INTRAVENOUS | Status: AC
  Administered 2022-12-09: 306 mg via INTRAVENOUS
  Filled 2022-12-09: qty 51

## 2022-12-09 MED ORDER — SODIUM CHLORIDE 0.9 % IV SOLN
10.0000 mg | Freq: Once | INTRAVENOUS | Status: AC
  Administered 2022-12-09: 10 mg via INTRAVENOUS
  Filled 2022-12-09: qty 10

## 2022-12-09 MED ORDER — SODIUM CHLORIDE 0.9 % IV SOLN
750.0000 mg | Freq: Once | INTRAVENOUS | Status: AC
  Administered 2022-12-09: 750 mg via INTRAVENOUS
  Filled 2022-12-09: qty 75

## 2022-12-09 MED ORDER — SODIUM CHLORIDE 0.9 % IV SOLN: Freq: Once | INTRAVENOUS | Status: AC

## 2022-12-09 MED ORDER — PREDNISONE 10 MG PO TABS
60.0000 mg | ORAL_TABLET | Freq: Every day | ORAL | 1 refills | Status: DC
  Filled 2022-12-09: qty 90, 15d supply, fill #0
  Filled 2023-02-10: qty 90, 15d supply, fill #1

## 2022-12-09 MED ORDER — PALONOSETRON HCL INJECTION 0.25 MG/5ML
0.2500 mg | Freq: Once | INTRAVENOUS | Status: AC
  Administered 2022-12-09: 0.25 mg via INTRAVENOUS
  Filled 2022-12-09: qty 5

## 2022-12-09 MED ORDER — FAMOTIDINE IN NACL 20-0.9 MG/50ML-% IV SOLN
20.0000 mg | Freq: Once | INTRAVENOUS | Status: AC
  Administered 2022-12-09: 20 mg via INTRAVENOUS
  Filled 2022-12-09: qty 50

## 2022-12-09 MED ORDER — CETIRIZINE HCL 10 MG/ML IV SOLN
10.0000 mg | Freq: Once | INTRAVENOUS | Status: AC
  Administered 2022-12-09: 10 mg via INTRAVENOUS
  Filled 2022-12-09: qty 1

## 2022-12-09 MED ORDER — SODIUM CHLORIDE 0.9 % IV SOLN
150.0000 mg | Freq: Once | INTRAVENOUS | Status: AC
  Administered 2022-12-09: 150 mg via INTRAVENOUS
  Filled 2022-12-09: qty 150

## 2022-12-09 NOTE — Progress Notes (Signed)
[facility] OFFICE PROGRESS NOTE  Patient Care Team: Masneri, Wille Celeste, DO as PCP - General (Family Medicine)  HISTORY OF PRESENTING ILLNESS: Discussed the use of AI scribe software for clinical note transcription with the patient, who gave verbal consent to proceed.  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 She reports that the psoriasis has worsened to the point of being visible on the scalp, and she has experienced hair loss despite using the DigniCap. The patient also reports severe bone pain, particularly in the shins and thighs, which she initially attributed to her psoriatic arthritis.  In addition to these symptoms, the patient has been experiencing fatigue, particularly one and a half to two weeks after her chemotherapy sessions. She reports feeling "not in good shape" during these periods. Despite these issues, the patient reports minimal nausea following chemotherapy. She did experience a period of constipation lasting three days, but this resolved after taking Metamucil.  The patient's current treatment regimen includes Taltz for her psoriasis, which she reports usually keeps her symptoms under control. However, she has not been able to maintain her usual regimen due to potential interactions with her chemotherapy. She has previously taken prednisone and is open to taking it again to manage her symptoms.  The patient has also been experiencing weight loss, which she attributes to a combination of the disease, changes in eating habits due to chemotherapy, and the fatigue she experiences after treatment. Despite these challenges, the patient is committed to continuing her treatment and managing her symptoms as best she can.         Assessment and Plan    Granulosa cell tumor of  ovary Proceed with treatment today with additional supportive care -Reschedule chemotherapy appointment from November 29 to November 27.    Psoriasis Flare Severe flare of psoriasis likely due to chemotherapy and discontinuation of Taltz. Bone pain also reported. Discussed the interaction between chemotherapy and Taltz. -Start Prednisone 10mg  tablets, with a flexible dosing regimen. Start with 20mg  daily and adjust as needed for pain control.  Chemotherapy-Induced Alopecia Significant hair loss despite use of DigniCap. Decision made to discontinue DigniCap due to lack of efficacy and cost. -Discontinue DigniCap.  Chemotherapy-Induced Constipation Patient reported using Metamucil for constipation. Discussed that Metamucil is not the best choice for chemotherapy-induced constipation. -Discontinue Metamucil. -Start over-the-counter Miralax for the first three days after chemotherapy.  Thrombocytopenia Platelet count has dropped, likely due to chemotherapy and possibly exacerbated by splenomegaly. Discussed that this is not due to lack of platelet production but rather sequestration in the spleen. -Continue chemotherapy with adjusted expectations for platelet count.          No orders of the defined types were placed in this encounter.   All questions were answered. The patient knows to call the clinic with any problems, questions or concerns. The total time spent in the appointment was 55 minutes encounter with patients including review of chart and various tests results, discussions about plan of care and coordination of care plan   Artis Delay, MD 12/09/2022 9:34 AM  REVIEW OF SYSTEMS:  All other systems were reviewed with the patient and are negative.  I have reviewed the past medical history, past surgical history, social history and family history with the patient and they are unchanged from previous note.  ALLERGIES:  has No  Known Allergies.  MEDICATIONS:  Current  Outpatient Medications  Medication Sig Dispense Refill   predniSONE (DELTASONE) 10 MG tablet Take 6 tablets (60 mg total) by mouth daily with breakfast. 90 tablet 1   Biotin 5000 MCG CAPS Take 5,000 mcg by mouth daily.     clobetasol cream (TEMOVATE) 0.05 % Apply 1 Application topically 2 (two) times daily for 14 days (Patient taking differently: Apply 1 Application topically 2 (two) times daily as needed (irritation).) 30 g 1   dexamethasone (DECADRON) 4 MG tablet Take 2 tablets the night before and 2 tablets the morning of chemotherapy, every 3 weeks for 6 cycles 24 tablet 6   empagliflozin (JARDIANCE) 10 MG TABS tablet Take 1 tablet (10 mg total) by mouth every morning with or without food 90 tablet 1   glucose blood (FREESTYLE LITE) test strip Use to check blood sugar 3-4 times a day 100 strip 3   Ixekizumab (TALTZ) 80 MG/ML SOSY Inject 1 ml Subcutaneous Every 4 weeks 30 days (Patient not taking: Reported on 10/28/2022) 1 mL 5   Lancets (FREESTYLE) lancets Use to test blood sugar 3 - 4 times a day 100 each 3   losartan-hydrochlorothiazide (HYZAAR) 50-12.5 MG tablet Take 1 tablet by mouth daily. (Blood pressure goal is 130/80) 90 tablet 3   metFORMIN (GLUCOPHAGE) 1000 MG tablet Take 1 tablet by mouth 2 times daily with a meal. 180 tablet 2   Multiple Vitamins-Minerals (MULTIVITAMIN GUMMIES WOMENS PO) Take 2 each by mouth daily.     ondansetron (ZOFRAN) 8 MG tablet Take 1 tablet (8 mg total) by mouth every 8 (eight) hours as needed for nausea or vomiting (Begin 3 days after carboplatin). 30 tablet 1   Oxycodone HCl 10 MG TABS Take 1 tablet (10 mg total) by mouth every 6 (six) hours as needed for severe pain. 30 tablet 0   prochlorperazine (COMPAZINE) 10 MG tablet Take 1 tablet (10 mg total) by mouth every 6 (six) hours as needed for nausea or vomiting. 30 tablet 1   rosuvastatin (CRESTOR) 5 MG tablet Take 1 tablet (5 mg total) by mouth 3 (three) times a week. 36 tablet 1   Semaglutide, 2 MG/DOSE,  (OZEMPIC, 2 MG/DOSE,) 8 MG/3ML SOPN Inject 2 mg into the skin once a week. 9 mL 1   SUMAtriptan (IMITREX) 100 MG tablet Take 1 tablet by mouth at onset of headach. ,Maybe repeat in 2 hours if still having migraine. Max daily dose of 200mg  per day. (Patient taking differently: Take 50 mg by mouth every 2 (two) hours as needed for migraine or headache. Take 0.5 tablet by mouth at onset of headach. ,Maybe repeat in 2 hours if still having migraine. Max daily dose of 200mg  per day.) 27 tablet 1   SUMAtriptan (IMITREX) 100 MG tablet Take 1 tablet at onset of headache, may be repeated in 2 hours if still having migraine (max daily dose 2 tablets) 15 tablet 3   No current facility-administered medications for this visit.   Facility-Administered Medications Ordered in Other Visits  Medication Dose Route Frequency Provider Last Rate Last Admin   CARBOplatin (PARAPLATIN) 750 mg in sodium chloride 0.9 % 250 mL chemo infusion  750 mg Intravenous Once Bertis Ruddy, Mery Guadalupe, MD       famotidine (PEPCID) IVPB 20 mg premix  20 mg Intravenous Once Bertis Ruddy, Constancia Geeting, MD 200 mL/hr at 12/09/22 0933 20 mg at 12/09/22 0933   PACLitaxel (TAXOL) 306 mg in sodium chloride 0.9 % 500 mL chemo infusion (>  80mg /m2)  140 mg/m2 (Treatment Plan Recorded) Intravenous Once Artis Delay, MD        SUMMARY OF ONCOLOGIC HISTORY: Oncology History Overview Note  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 No obstructive uropathy. No nephrolithiasis. No acute inflammatory process identified within the abdomen or pelvis. 2. Large predominantly cystic mass adjacent to and inseparable from the right ovary and the fundus of the uterus measuring approximately 14.2 x 16.2 x 17.3 cm (AP x TR x CC) which exhibits multiple intervening mural nodules/hyperattenuating septations. This is indeterminate but  concerning for neoplastic process arising from the right ovary or uterus. OBGYN consultation and contrast-enhanced MRI pelvis is recommended. 3. Grossly stable, ill-defined hypoattenuating right hepatic lobe segment 7 lesion, better evaluated on the prior MRI from 2019. 4. Multiple indeterminate retroperitoneal lymph nodes with short axis less than 1 cm. 5. Multiple other nonacute observations, as described above.   09/13/2022 Tumor Marker   Inhibin B: 963.6   09/24/2022 Imaging   MRI abdomen/pelvis: IMPRESSION: 1. Complex multi septated cystic lesion with internal enhancing solid components arising from the right adnexa measuring up to 19.2 cm, consistent with ovarian neoplasm. 2. Prominent retroperitoneal iliac side chain and pelvic sidewall lymph nodes are similar dating back to 2019 and are favored to be reactive. 3. Cirrhotic liver morphology with splenomegaly. 4. Similar size of the 5.2 cm hemangioma in the posterior right lobe of the liver. 5. Cholelithiasis without findings of acute cholecystitis. Gallbladder wall thickening is favored sequela of chronic hepatic disease. 6. Well-circumscribed T2 hyperintense homogeneously enhancing anterior intramural uterine lesion measuring up to 4.5 cm, favored to reflect a degenerating fibroid.   10/12/2022 Cancer Staging   Staging form: Ovary, Fallopian Tube, and Primary Peritoneal Carcinoma, AJCC 8th Edition - Pathologic stage from 10/12/2022: FIGO Stage IC2, calculated as Stage Unknown (pT1c2, pNX, cM0) - Signed by Clide Cliff, MD on 11/02/2022 Histopathologic type: Granulosa cell tumor, adult type Stage prefix: Initial diagnosis   10/12/2022 Surgery   Mini laparotomy for controlled cyst drainage, robotic assisted total laparoscopic hysterectomy, bilateral salpingo-oophorectomy, pelvic washings, infracolic omentectomy  Findings: On bimanual exam, mobile pelvic mass that arises to the level of the mid abdomen. On entry to abdomen, upper  abdominal survey with nodular appearance of the liver, possibly consistent with cirrhosis. Otherwise smooth diaphragm, stomach and normal appearing omentum and bowel. Cystic and solid mass with smooth surface filling the pelvis. Controlled drainage of one portion of the cystic mass performed with yellow cyst fluid drained. On robotic portion of right salpingo-oophorectomy, incidental leakage of cyst fluid did occur. Remainder of mass drained and morcellated in a bag out of the supraumbilical incision with alexis retractor. 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 Normal appearing left fallopian tube and ovary. 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    10/12/2022 Pathology Results   A. 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 UTERUS, CERVIX, LEFT FALLOPIAN TUBE AND OVARY, RESECTION: - Uterus with benign leiomyoma, 5.4 cm - Benign proliferative endometrium - Benign unremarkable cervix - Benign unremarkable left fallopian tube and ovary -  No evidence of malignancy  C. OMENTUM: - Portion of omentum, negative for tumor     COMMENT:  A.  Immunohistochemical stains show that the tumor cells are positive for calretinin and inhibin while they are negative for CK7, synaptophysin, chromogranin and PAX8, consistent with above interpretation.  Dr. Venetia Night reviewed the case and concurs with the diagnosis.   ONCOLOGY TABLE:  OVARY or FALLOPIAN TUBE or PRIMARY PERITONEUM: Resection  Procedure: Hysterectomy and bilateral salpingo-oophorectomy Specimen Integrity: Partially disrupted Tumor Site: Right ovary Tumor Size: Approximately 12.5 cm Histologic Type: Sex cord stromal tumor with mixed Sertoli cell  tumor and granulosa cell tumor, adult type Histologic Grade: Not applicable Ovarian Surface Involvement: Present Fallopian Tube Surface Involvement: Not identified Implants: Not applicable Lymphatic and/or Vascular Invasion: Not identified Other Tissue/ Organ Involvement: Not applicable Largest Extrapelvic Peritoneal Focus: Not applicable Peritoneal/Ascitic Fluid Involvement: Not applicable Chemotherapy Response Score (CRS): Not applicable, no known presurgical therapy Regional Lymph Nodes: Not applicable (no lymph nodes submitted or found) Distant Metastasis:      Distant Site(s) Involved: Not applicable Pathologic Stage Classification (pTNM, AJCC 8th Edition): pT1c2, pN not assigned Ancillary Studies: Can be performed upon request Representative Tumor Block: A6    10/12/2022 Initial Diagnosis   Granulosa cell carcinoma of ovary, right (HCC)   11/18/2022 -  Chemotherapy   Patient is on Treatment Plan : OVARIAN Carboplatin (AUC 6) + Paclitaxel (175) q21d X 6 Cycles       PHYSICAL EXAMINATION: ECOG PERFORMANCE STATUS: 2 - Symptomatic, <50% confined to bed  Vitals:   12/09/22 0822  BP: 114/80  Pulse: 82  Resp: 18  Temp: 97.6 F (36.4 C)  SpO2: 97%   Filed Weights   12/09/22 0822  Weight: 220 lb (99.8 kg)    GENERAL:alert, no distress and comfortable Noted significant skin plaques from flare of psoriasis  LABORATORY DATA:  I have reviewed the data as listed    Component Value Date/Time   NA 138 12/09/2022 0742   K 3.5 12/09/2022 0742   CL 102 12/09/2022 0742   CO2 25 12/09/2022 0742   GLUCOSE 195 (H) 12/09/2022 0742   BUN 19 12/09/2022 0742   CREATININE 1.10 (H) 12/09/2022 0742   CALCIUM 9.7 12/09/2022 0742   PROT 8.1 12/09/2022 0742   ALBUMIN 4.5 12/09/2022 0742   AST 34 12/09/2022 0742   ALT 31 12/09/2022 0742   ALKPHOS 46 12/09/2022 0742   BILITOT 1.0 12/09/2022 0742   GFRNONAA >60 12/09/2022 0742    No results found for: "SPEP", "UPEP"  Lab  Results  Component Value Date   WBC 7.0 12/09/2022   NEUTROABS 5.9 12/09/2022   HGB 12.4 12/09/2022   HCT 36.2 12/09/2022   MCV 84.0 12/09/2022   PLT 79 (L) 12/09/2022      Chemistry      Component Value Date/Time   NA 138 12/09/2022 0742   K 3.5 12/09/2022 0742   CL 102 12/09/2022 0742   CO2 25 12/09/2022 0742   BUN 19 12/09/2022 0742   CREATININE 1.10 (H) 12/09/2022 0742      Component Value Date/Time   CALCIUM 9.7 12/09/2022 0742   ALKPHOS 46 12/09/2022 0742   AST 34 12/09/2022 0742   ALT 31 12/09/2022 0742   BILITOT 1.0 12/09/2022 0742       RADIOGRAPHIC STUDIES: I have personally reviewed the radiological images as listed and agreed with the findings in the report. CT ABDOMEN PELVIS W CONTRAST  Result Date: 11/26/2022 CLINICAL DATA:  Abdominal pain and fever for several days. Cirrhosis. Recent surgery for right ovarian neoplasm. EXAM: CT ABDOMEN AND PELVIS WITH CONTRAST TECHNIQUE: Multidetector CT imaging of the abdomen and pelvis was performed using the standard protocol following bolus administration of intravenous contrast. RADIATION DOSE REDUCTION: This exam was performed according to the departmental dose-optimization program which includes automated exposure control, adjustment of the mA and/or kV according to patient size and/or use of iterative reconstruction technique. CONTRAST:  OMNIPAQUE IOHEXOL 300 MG/ML  SOLN COMPARISON:  MRI on 09/24/2022 FINDINGS: Lower Chest: No acute findings. Hepatobiliary: Hepatic cirrhosis is again demonstrated. A 5 cm benign hemangioma is again seen in the posterior right hepatic lobe. No other hepatic masses are identified. Gallstones are seen, however there is no evidence of cholecystitis or biliary dilatation. Pancreas:  No mass or inflammatory changes. Spleen: Stable moderate splenomegaly, consistent with portal vein hypertension. No evidence of ascites. Adrenals/Urinary Tract: No suspicious masses identified. No evidence of  ureteral calculi or hydronephrosis. Unremarkable unopacified urinary bladder. Stomach/Bowel: No evidence of obstruction, inflammatory process or abnormal fluid collections. Normal appendix visualized. Vascular/Lymphatic: No pathologically enlarged lymph nodes. No acute vascular findings. Reproductive: Patient has undergone hysterectomy and resection of large right ovarian mass since previous study. No residual pelvic masses identified. No evidence of peritoneal or omental nodularity or ascites. Other:  None. Musculoskeletal:  No suspicious bone lesions identified. IMPRESSION: No acute findings. No evidence of recurrent carcinoma or metastatic disease. Hepatic cirrhosis. Stable 5 cm benign hemangioma in right hepatic lobe. Stable splenomegaly, consistent with portal venous hypertension. Cholelithiasis. No radiographic evidence of cholecystitis. Electronically Signed   By: Danae Orleans M.D.   On: 11/26/2022 15:43   DG Chest Port 1 View  Result Date: 11/26/2022 CLINICAL DATA:  Cough. EXAM: PORTABLE CHEST 1 VIEW COMPARISON:  06/25/2022. FINDINGS: Bilateral lung fields are clear. Bilateral lateral costophrenic angles are clear. Normal cardio-mediastinal silhouette. No acute osseous abnormalities. The soft tissues are within normal limits. IMPRESSION: No active disease. Electronically Signed   By: Jules Schick M.D.   On: 11/26/2022 13:36

## 2022-12-09 NOTE — Patient Instructions (Signed)
[facility]  Discharge Instructions: Thank you for choosing [facility] to provide your oncology and hematology care.   If you have a lab appointment with the Cancer Center, please go directly to the Cancer Center and check in at the registration area.   Wear comfortable clothing and clothing appropriate for easy access to any Portacath or PICC line.   We strive to give you quality time with your provider. You may need to reschedule your appointment if you arrive late (15 or more minutes).  Arriving late affects you and other patients whose appointments are after yours.  Also, if you miss three or more appointments without notifying the office, you may be dismissed from the clinic at the provider's discretion.      For prescription refill requests, have your pharmacy contact our office and allow 72 hours for refills to be completed.    Today you received the following chemotherapy and/or immunotherapy agents: Taxol/Carboplatin      To help prevent nausea and vomiting after your treatment, we encourage you to take your nausea medication as directed.  BELOW ARE SYMPTOMS THAT SHOULD BE REPORTED IMMEDIATELY: *FEVER GREATER THAN 100.4 F (38 C) OR HIGHER *CHILLS OR SWEATING *NAUSEA AND VOMITING THAT IS NOT CONTROLLED WITH YOUR NAUSEA MEDICATION *UNUSUAL SHORTNESS OF BREATH *UNUSUAL BRUISING OR BLEEDING *URINARY PROBLEMS (pain or burning when urinating, or frequent urination) *BOWEL PROBLEMS (unusual diarrhea, constipation, pain near the anus) TENDERNESS IN MOUTH AND THROAT WITH OR WITHOUT PRESENCE OF ULCERS (sore throat, sores in mouth, or a toothache) UNUSUAL RASH, SWELLING OR PAIN  UNUSUAL VAGINAL DISCHARGE OR ITCHING   Items with * indicate a potential emergency and should be followed up as soon as possible or go to the Emergency Department if any problems should occur.  Please show the CHEMOTHERAPY ALERT CARD or IMMUNOTHERAPY ALERT CARD  at check-in to the Emergency Department and triage nurse.  Should you have questions after your visit or need to cancel or reschedule your appointment, please contact [place] [facility]  Dept: [phone]  and follow the prompts.  Office hours are 8:00 a.m. to 4:30 p.m. Monday - Friday. Please note that voicemails left after 4:00 p.m. may not be returned until the following business day.  We are closed weekends and major holidays. You have access to a nurse at all times for urgent questions. Please call the main number to the clinic Dept: [phone] and follow the prompts.   For any non-urgent questions, you may also contact your provider using MyChart. We now offer e-Visits for anyone 46 and older to request care online for non-urgent symptoms. For details visit [email or web address].   Also download the MyChart app! Go to the app store, search "MyChart", open the app, select [place], and log in with your MyChart username and password.

## 2022-12-13 ENCOUNTER — Telehealth: Payer: Self-pay | Admitting: Oncology

## 2022-12-13 NOTE — Telephone Encounter (Signed)
Faxed completed cancer form to [employer] and emailed a copy to [place] per her request.

## 2022-12-14 ENCOUNTER — Telehealth: Payer: Self-pay

## 2022-12-14 ENCOUNTER — Other Ambulatory Visit: Payer: Self-pay

## 2022-12-14 NOTE — Telephone Encounter (Signed)
Notified Patient of completion of FMLA forms. Fax transmission confirmation received. Copy of forms e-mailed to Patient as requested. No other needs or concerns voiced at this time. 

## 2022-12-15 ENCOUNTER — Encounter (HOSPITAL_COMMUNITY): Payer: Self-pay

## 2022-12-15 LAB — SURGICAL PATHOLOGY

## 2022-12-16 ENCOUNTER — Telehealth: Payer: Self-pay

## 2022-12-16 NOTE — Telephone Encounter (Signed)
Spoke to patient informing her that her [employer] Claim had been completed and faxed to the company. Fax confirmation received. Copy of documents emailed to [email or web address] as requested.

## 2022-12-27 ENCOUNTER — Telehealth: Payer: Self-pay

## 2022-12-27 NOTE — Telephone Encounter (Signed)
Called regarding appt on 11/7 with Dr. Bertis Ruddy, told her Dr. Bertis Ruddy would see her at infusion appt.

## 2022-12-30 ENCOUNTER — Inpatient Hospital Stay: Payer: Commercial Managed Care - PPO | Attending: Psychiatry

## 2022-12-30 ENCOUNTER — Ambulatory Visit: Payer: Commercial Managed Care - PPO | Admitting: Hematology and Oncology

## 2022-12-30 ENCOUNTER — Ambulatory Visit: Payer: Commercial Managed Care - PPO

## 2022-12-30 ENCOUNTER — Telehealth: Payer: Self-pay

## 2022-12-30 ENCOUNTER — Inpatient Hospital Stay: Payer: Commercial Managed Care - PPO

## 2022-12-30 ENCOUNTER — Encounter: Payer: Self-pay | Admitting: Hematology and Oncology

## 2022-12-30 ENCOUNTER — Other Ambulatory Visit: Payer: Commercial Managed Care - PPO

## 2022-12-30 ENCOUNTER — Inpatient Hospital Stay: Payer: Commercial Managed Care - PPO | Admitting: Hematology and Oncology

## 2022-12-30 VITALS — BP 131/89 | HR 89 | Temp 97.8°F | Resp 18 | Ht 69.0 in | Wt 221.8 lb

## 2022-12-30 DIAGNOSIS — L4050 Arthropathic psoriasis, unspecified: Secondary | ICD-10-CM | POA: Diagnosis not present

## 2022-12-30 DIAGNOSIS — E119 Type 2 diabetes mellitus without complications: Secondary | ICD-10-CM | POA: Insufficient documentation

## 2022-12-30 DIAGNOSIS — C561 Malignant neoplasm of right ovary: Secondary | ICD-10-CM | POA: Insufficient documentation

## 2022-12-30 DIAGNOSIS — Z5111 Encounter for antineoplastic chemotherapy: Secondary | ICD-10-CM | POA: Insufficient documentation

## 2022-12-30 DIAGNOSIS — D61818 Other pancytopenia: Secondary | ICD-10-CM | POA: Diagnosis not present

## 2022-12-30 LAB — CBC WITH DIFFERENTIAL (CANCER CENTER ONLY)
Abs Immature Granulocytes: 0.06 10*3/uL (ref 0.00–0.07)
Basophils Absolute: 0 10*3/uL (ref 0.0–0.1)
Basophils Relative: 0 %
Eosinophils Absolute: 0 10*3/uL (ref 0.0–0.5)
Eosinophils Relative: 0 %
HCT: 33 % — ABNORMAL LOW (ref 36.0–46.0)
Hemoglobin: 11.1 g/dL — ABNORMAL LOW (ref 12.0–15.0)
Immature Granulocytes: 1 %
Lymphocytes Relative: 14 %
Lymphs Abs: 0.6 10*3/uL — ABNORMAL LOW (ref 0.7–4.0)
MCH: 29.1 pg (ref 26.0–34.0)
MCHC: 33.6 g/dL (ref 30.0–36.0)
MCV: 86.6 fL (ref 80.0–100.0)
Monocytes Absolute: 0.1 10*3/uL (ref 0.1–1.0)
Monocytes Relative: 3 %
Neutro Abs: 3.5 10*3/uL (ref 1.7–7.7)
Neutrophils Relative %: 82 %
Platelet Count: 37 10*3/uL — ABNORMAL LOW (ref 150–400)
RBC: 3.81 MIL/uL — ABNORMAL LOW (ref 3.87–5.11)
RDW: 15.2 % (ref 11.5–15.5)
WBC Count: 4.3 10*3/uL (ref 4.0–10.5)
nRBC: 0 % (ref 0.0–0.2)

## 2022-12-30 LAB — CMP (CANCER CENTER ONLY)
ALT: 35 U/L (ref 0–44)
AST: 30 U/L (ref 15–41)
Albumin: 4.4 g/dL (ref 3.5–5.0)
Alkaline Phosphatase: 37 U/L — ABNORMAL LOW (ref 38–126)
Anion gap: 13 (ref 5–15)
BUN: 16 mg/dL (ref 6–20)
CO2: 21 mmol/L — ABNORMAL LOW (ref 22–32)
Calcium: 9.7 mg/dL (ref 8.9–10.3)
Chloride: 103 mmol/L (ref 98–111)
Creatinine: 0.73 mg/dL (ref 0.44–1.00)
GFR, Estimated: >60 mL/min (ref >60)
Glucose, Bld: 315 mg/dL — ABNORMAL HIGH (ref 70–99)
Potassium: 3.6 mmol/L (ref 3.5–5.1)
Sodium: 137 mmol/L (ref 135–145)
Total Bilirubin: 0.9 mg/dL (ref <1.2)
Total Protein: 7.6 g/dL (ref 6.5–8.1)

## 2022-12-30 MED ORDER — HEPARIN SOD (PORK) LOCK FLUSH 100 UNIT/ML IV SOLN: 500.0000 [IU] | Freq: Once | INTRAVENOUS | Status: DC

## 2022-12-30 MED ORDER — SODIUM CHLORIDE 0.9% FLUSH
10.0000 mL | Freq: Once | INTRAVENOUS | Status: DC
Start: 2022-12-30 — End: 2022-12-30

## 2022-12-30 NOTE — Progress Notes (Signed)
Patient seen by Dr. Eugene Gavia are within treatment parameters.  Labs reviewed: and are not all within treatment parameters. Platelets are 37.  Per physician team, patient will not be receiving treatment today. Charge RN aware and appointment cancelled.

## 2022-12-30 NOTE — Progress Notes (Signed)
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[facility]) She has progressive pancytopenia with treatment Due to significant thrombocytopenia, I will defer her treatment plan until approximately 2 weeks from now to allow bone marrow recovery I recommend dose adjustment moving forward   Pancytopenia, acquired (HCC) She has worsening pancytopenia This is multifactorial She is known to have liver cirrhosis and splenomegaly but with her platelet count less than 50,000, it is not safe to proceed She is symptomatic with increased bruising and recent hemorrhoidal bleeding She does not need transfusion support We will observe closely and we will proceed with chemotherapy next cycle with dose adjustment as discussed above  Diabetes mellitus without complication (HCC) Her blood sugar is very high today likely due to steroid treatment We will continue conservative management  Psoriatic arthritis (HCC) She has intermittent flare of psoriatic arthritis, improved with intermittent doses of prednisone She will continue the same but I would caution the patient about risk of severe hyperglycemia while on prednisone  Orders Placed This Encounter  Procedures   CBC with Differential (Cancer Center Only)    Standing Status:   Future    Standing Expiration Date:   01/12/2024   CMP (Cancer Center only)    Standing Status:   Future    Standing Expiration Date:   01/12/2024    All questions were answered. The patient knows to call the clinic with any problems, questions or concerns. The total time spent in the appointment was 40 minutes encounter with patients including review of chart and various tests results, discussions about plan of care and coordination of care plan   Artis Delay, MD 12/30/2022 9:22 AM  INTERVAL HISTORY: Please see below for problem oriented  charting. she returns for chemotherapy She is here accompanied by her husband She has noticed some joint pain but improved with intermittent doses of prednisone She has brief episode of constipation after chemotherapy that resolved with laxative She has noticed increased bruising and hemorrhoidal bleeding intermittently We discussed test results and the rationale of delaying treatment  REVIEW OF SYSTEMS:   Constitutional: Denies fevers, chills or abnormal weight loss Eyes: Denies blurriness of vision Ears, nose, mouth, throat, and face: Denies mucositis or sore throat Respiratory: Denies cough, dyspnea or wheezes Cardiovascular: Denies palpitation, chest discomfort or lower extremity swelling Skin: Denies abnormal skin rashes Lymphatics: Denies new lymphadenopathy Neurological:Denies numbness, tingling or new weaknesses Behavioral/Psych: Mood is stable, no new changes  All other systems were reviewed with the patient and are negative.  I have reviewed the past medical history, past surgical history, social history and family history with the patient and they are unchanged from previous note.  ALLERGIES:  has No Known Allergies.  MEDICATIONS:  Current Outpatient Medications  Medication Sig Dispense Refill   Biotin 5000 MCG CAPS Take 5,000 mcg by mouth daily.     clobetasol cream (TEMOVATE) 0.05 % Apply 1 Application topically 2 (two) times daily for 14 days (Patient taking differently: Apply 1 Application topically 2 (two) times daily as needed (irritation).) 30 g 1   dexamethasone (DECADRON) 4 MG tablet Take 2 tablets the night before and 2 tablets the morning of chemotherapy, every 3 weeks for 6 cycles 24 tablet 6   empagliflozin (JARDIANCE) 10 MG TABS tablet Take 1 tablet (10 mg total) by mouth every morning with or without food 90 tablet 1  glucose blood (FREESTYLE LITE) test strip Use to check blood sugar 3-4 times a day 100 strip 3   Ixekizumab (TALTZ) 80 MG/ML SOSY Inject 1 ml  Subcutaneous Every 4 weeks 30 days (Patient not taking: Reported on 10/28/2022) 1 mL 5   Lancets (FREESTYLE) lancets Use to test blood sugar 3 - 4 times a day 100 each 3   losartan-hydrochlorothiazide (HYZAAR) 50-12.5 MG tablet Take 1 tablet by mouth daily. (Blood pressure goal is 130/80) 90 tablet 3   metFORMIN (GLUCOPHAGE) 1000 MG tablet Take 1 tablet by mouth 2 times daily with a meal. 180 tablet 2   Multiple Vitamins-Minerals (MULTIVITAMIN GUMMIES WOMENS PO) Take 2 each by mouth daily.     ondansetron (ZOFRAN) 8 MG tablet Take 1 tablet (8 mg total) by mouth every 8 (eight) hours as needed for nausea or vomiting (Begin 3 days after carboplatin). 30 tablet 1   Oxycodone HCl 10 MG TABS Take 1 tablet (10 mg total) by mouth every 6 (six) hours as needed for severe pain. 30 tablet 0   predniSONE (DELTASONE) 10 MG tablet Take 6 tablets (60 mg total) by mouth daily with breakfast. 90 tablet 1   prochlorperazine (COMPAZINE) 10 MG tablet Take 1 tablet (10 mg total) by mouth every 6 (six) hours as needed for nausea or vomiting. 30 tablet 1   rosuvastatin (CRESTOR) 5 MG tablet Take 1 tablet (5 mg total) by mouth 3 (three) times a week. 36 tablet 1   Semaglutide, 2 MG/DOSE, (OZEMPIC, 2 MG/DOSE,) 8 MG/3ML SOPN Inject 2 mg into the skin once a week. 9 mL 1   SUMAtriptan (IMITREX) 100 MG tablet Take 1 tablet by mouth at onset of headach. ,Maybe repeat in 2 hours if still having migraine. Max daily dose of 200mg  per day. (Patient taking differently: Take 50 mg by mouth every 2 (two) hours as needed for migraine or headache. Take 0.5 tablet by mouth at onset of headach. ,Maybe repeat in 2 hours if still having migraine. Max daily dose of 200mg  per day.) 27 tablet 1   SUMAtriptan (IMITREX) 100 MG tablet Take 1 tablet at onset of headache, may be repeated in 2 hours if still having migraine (max daily dose 2 tablets) 15 tablet 3   Current Facility-Administered Medications  Medication Dose Route Frequency Provider Last  Rate Last Admin   heparin lock flush 100 unit/mL  500 Units Intravenous Once Monda Chastain, MD       sodium chloride flush (NS) 0.9 % injection 10 mL  10 mL Intravenous Once Artis Delay, MD        SUMMARY OF ONCOLOGIC HISTORY: Oncology History  Granulosa cell carcinoma of ovary, right (HCC)  09/06/2022 Imaging   CT Renal Stone: IMPRESSION: 1. No obstructive uropathy. No nephrolithiasis. No acute inflammatory process identified within the abdomen or pelvis. 2. Large predominantly cystic mass adjacent to and inseparable from the right ovary and the fundus of the uterus measuring approximately 14.2 x 16.2 x 17.3 cm (AP x TR x CC) which exhibits multiple intervening mural nodules/hyperattenuating septations. This is indeterminate but concerning for neoplastic process arising from the right ovary or uterus. OBGYN consultation and contrast-enhanced MRI pelvis is recommended. 3. Grossly stable, ill-defined hypoattenuating right hepatic lobe segment 7 lesion, better evaluated on the prior MRI from 2019. 4. Multiple indeterminate retroperitoneal lymph nodes with short axis less than 1 cm. 5. Multiple other nonacute observations, as described above.   09/13/2022 Tumor Marker   Inhibin B: 963.6  09/24/2022 Imaging   MRI abdomen/pelvis: IMPRESSION: 1. Complex multi septated cystic lesion with internal enhancing solid components arising from the right adnexa measuring up to 19.2 cm, consistent with ovarian neoplasm. 2. Prominent retroperitoneal iliac side chain and pelvic sidewall lymph nodes are similar dating back to 2019 and are favored to be reactive. 3. Cirrhotic liver morphology with splenomegaly. 4. Similar size of the 5.2 cm hemangioma in the posterior right lobe of the liver. 5. Cholelithiasis without findings of acute cholecystitis. Gallbladder wall thickening is favored sequela of chronic hepatic disease. 6. Well-circumscribed T2 hyperintense homogeneously enhancing anterior intramural  uterine lesion measuring up to 4.5 cm, favored to reflect a degenerating fibroid.   10/12/2022 Cancer Staging   Staging form: Ovary, Fallopian Tube, and Primary Peritoneal Carcinoma, AJCC 8th Edition - Pathologic stage from 10/12/2022: FIGO Stage IC2, calculated as Stage Unknown (pT1c2, pNX, cM0) - Signed by Clide Cliff, MD on 11/02/2022 Histopathologic type: Granulosa cell tumor, adult type Stage prefix: Initial diagnosis   10/12/2022 Surgery   Mini laparotomy for controlled cyst drainage, robotic assisted total laparoscopic hysterectomy, bilateral salpingo-oophorectomy, pelvic washings, infracolic omentectomy  Findings: On bimanual exam, mobile pelvic mass that arises to the level of the mid abdomen. On entry to abdomen, upper abdominal survey with nodular appearance of the liver, possibly consistent with cirrhosis. Otherwise smooth diaphragm, stomach and normal appearing omentum and bowel. Cystic and solid mass with smooth surface filling the pelvis. Controlled drainage of one portion of the cystic mass performed with yellow cyst fluid drained. On robotic portion of right salpingo-oophorectomy, incidental leakage of cyst fluid did occur. Remainder of mass drained and morcellated in a bag out of the supraumbilical incision with alexis retractor. 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 Normal appearing left fallopian tube and ovary. 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    10/12/2022 Pathology Results   A. 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 UTERUS, CERVIX, LEFT FALLOPIAN TUBE AND OVARY, RESECTION: - Uterus with benign leiomyoma, 5.4 cm - Benign proliferative endometrium - Benign unremarkable  cervix - Benign unremarkable left fallopian tube and ovary - No evidence of malignancy  C. OMENTUM: - Portion of omentum, negative for tumor     COMMENT:  A.  Immunohistochemical stains show that the tumor cells are positive for calretinin and inhibin while they are negative for CK7, synaptophysin, chromogranin and PAX8, consistent with above interpretation.  Dr. Venetia Night reviewed the case and concurs with the diagnosis.   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, adult type Histologic Grade: Not applicable Ovarian Surface Involvement: Present Fallopian Tube Surface Involvement: Not identified Implants: Not applicable Lymphatic and/or Vascular Invasion: Not identified Other Tissue/ Organ Involvement: Not applicable Largest Extrapelvic Peritoneal Focus: Not applicable Peritoneal/Ascitic Fluid Involvement: Not applicable Chemotherapy Response Score (CRS): Not applicable, no known presurgical therapy Regional Lymph Nodes: Not applicable (no lymph nodes submitted or found) Distant Metastasis:      Distant Site(s) Involved: Not applicable Pathologic Stage Classification (pTNM, AJCC 8th Edition): pT1c2, pN not  assigned Ancillary Studies: Can be performed upon request Representative Tumor Block: A6    10/12/2022 Initial Diagnosis   Granulosa cell carcinoma of ovary, right (HCC)   11/18/2022 -  Chemotherapy   Patient is on Treatment Plan : OVARIAN Carboplatin (AUC 6) + Paclitaxel (175) q21d X 6 Cycles       PHYSICAL EXAMINATION: ECOG PERFORMANCE STATUS: 1 - Symptomatic but completely ambulatory  Vitals:   12/30/22 0804  BP: 131/89  Pulse: 89  Resp: 18  Temp: 97.8 F (36.6 C)   SpO2: 97%   Filed Weights   12/30/22 0804  Weight: 221 lb 12.8 oz (100.6 kg)    GENERAL:alert, no distress and comfortable SKIN: Noted minor skin bruises as well as Psoriatic plaques  LABORATORY DATA:  I have reviewed the data as listed    Component Value Date/Time   NA 137 12/30/2022 0732   K 3.6 12/30/2022 0732   CL 103 12/30/2022 0732   CO2 21 (L) 12/30/2022 0732   GLUCOSE 315 (H) 12/30/2022 0732   BUN 16 12/30/2022 0732   CREATININE 0.73 12/30/2022 0732   CALCIUM 9.7 12/30/2022 0732   PROT 7.6 12/30/2022 0732   ALBUMIN 4.4 12/30/2022 0732   AST 30 12/30/2022 0732   ALT 35 12/30/2022 0732   ALKPHOS 37 (L) 12/30/2022 0732   BILITOT 0.9 12/30/2022 0732   GFRNONAA >60 12/30/2022 0732    No results found for: "SPEP", "UPEP"  Lab Results  Component Value Date   WBC 4.3 12/30/2022   NEUTROABS 3.5 12/30/2022   HGB 11.1 (L) 12/30/2022   HCT 33.0 (L) 12/30/2022   MCV 86.6 12/30/2022   PLT 37 (L) 12/30/2022      Chemistry      Component Value Date/Time   NA 137 12/30/2022 0732   K 3.6 12/30/2022 0732   CL 103 12/30/2022 0732   CO2 21 (L) 12/30/2022 0732   BUN 16 12/30/2022 0732   CREATININE 0.73 12/30/2022 0732      Component Value Date/Time   CALCIUM 9.7 12/30/2022 0732   ALKPHOS 37 (L) 12/30/2022 0732   AST 30 12/30/2022 0732   ALT 35 12/30/2022 0732   BILITOT 0.9 12/30/2022 0732

## 2022-12-30 NOTE — Assessment & Plan Note (Signed)
Her blood sugar is very high today likely due to steroid treatment We will continue conservative management

## 2022-12-30 NOTE — Assessment & Plan Note (Signed)
She has worsening pancytopenia This is multifactorial She is known to have liver cirrhosis and splenomegaly but with her platelet count less than 50,000, it is not safe to proceed She is symptomatic with increased bruising and recent hemorrhoidal bleeding She does not need transfusion support We will observe closely and we will proceed with chemotherapy next cycle with dose adjustment as discussed above

## 2022-12-30 NOTE — Assessment & Plan Note (Signed)
She has progressive pancytopenia with treatment Due to significant thrombocytopenia, I will defer her treatment plan until approximately 2 weeks from now to allow bone marrow recovery I recommend dose adjustment moving forward

## 2022-12-30 NOTE — Assessment & Plan Note (Signed)
She has intermittent flare of psoriatic arthritis, improved with intermittent doses of prednisone She will continue the same but I would caution the patient about risk of severe hyperglycemia while on prednisone

## 2022-12-30 NOTE — Telephone Encounter (Signed)
-----   Message from Artis Delay sent at 12/30/2022  9:23 AM EST ----- Her blood sugar is very high Recommend patient to monitor closely at home and be careful when she takes prednisone

## 2022-12-30 NOTE — Telephone Encounter (Signed)
Patient made aware that her glucose level was elevated today. Dr. Bertis Ruddy recommendations provided. Patient verbalized an understanding of the information.

## 2023-01-11 ENCOUNTER — Other Ambulatory Visit: Payer: Self-pay

## 2023-01-11 ENCOUNTER — Other Ambulatory Visit (HOSPITAL_COMMUNITY): Payer: Self-pay

## 2023-01-11 MED ORDER — ROSUVASTATIN CALCIUM 5 MG PO TABS
ORAL_TABLET | ORAL | 1 refills | Status: DC
  Filled 2023-01-11: qty 36, 90d supply, fill #0
  Filled 2023-04-07: qty 36, 90d supply, fill #1

## 2023-01-11 MED FILL — Fosaprepitant Dimeglumine For IV Infusion 150 MG (Base Eq): INTRAVENOUS | Qty: 5 | Status: AC

## 2023-01-12 ENCOUNTER — Other Ambulatory Visit (HOSPITAL_COMMUNITY): Payer: Self-pay

## 2023-01-12 ENCOUNTER — Inpatient Hospital Stay: Payer: Commercial Managed Care - PPO

## 2023-01-12 ENCOUNTER — Other Ambulatory Visit: Payer: Self-pay | Admitting: Hematology and Oncology

## 2023-01-12 VITALS — BP 120/81 | HR 88 | Temp 98.2°F | Resp 14

## 2023-01-12 DIAGNOSIS — E119 Type 2 diabetes mellitus without complications: Secondary | ICD-10-CM | POA: Diagnosis not present

## 2023-01-12 DIAGNOSIS — C561 Malignant neoplasm of right ovary: Secondary | ICD-10-CM

## 2023-01-12 DIAGNOSIS — Z5111 Encounter for antineoplastic chemotherapy: Secondary | ICD-10-CM | POA: Diagnosis not present

## 2023-01-12 DIAGNOSIS — D61818 Other pancytopenia: Secondary | ICD-10-CM | POA: Diagnosis not present

## 2023-01-12 DIAGNOSIS — L4050 Arthropathic psoriasis, unspecified: Secondary | ICD-10-CM | POA: Diagnosis not present

## 2023-01-12 LAB — CBC WITH DIFFERENTIAL (CANCER CENTER ONLY)
Abs Immature Granulocytes: 0.11 10*3/uL — ABNORMAL HIGH (ref 0.00–0.07)
Basophils Absolute: 0.1 10*3/uL (ref 0.0–0.1)
Basophils Relative: 1 %
Eosinophils Absolute: 0 10*3/uL (ref 0.0–0.5)
Eosinophils Relative: 0 %
HCT: 36.8 % (ref 36.0–46.0)
Hemoglobin: 12.5 g/dL (ref 12.0–15.0)
Immature Granulocytes: 2 %
Lymphocytes Relative: 12 %
Lymphs Abs: 0.8 10*3/uL (ref 0.7–4.0)
MCH: 30 pg (ref 26.0–34.0)
MCHC: 34 g/dL (ref 30.0–36.0)
MCV: 88.2 fL (ref 80.0–100.0)
Monocytes Absolute: 0.1 10*3/uL (ref 0.1–1.0)
Monocytes Relative: 2 %
Neutro Abs: 5.3 10*3/uL (ref 1.7–7.7)
Neutrophils Relative %: 83 %
Platelet Count: 93 10*3/uL — ABNORMAL LOW (ref 150–400)
RBC: 4.17 MIL/uL (ref 3.87–5.11)
RDW: 17.1 % — ABNORMAL HIGH (ref 11.5–15.5)
WBC Count: 6.3 10*3/uL (ref 4.0–10.5)
nRBC: 0 % (ref 0.0–0.2)

## 2023-01-12 LAB — CMP (CANCER CENTER ONLY)
ALT: 29 U/L (ref 0–44)
AST: 27 U/L (ref 15–41)
Albumin: 4.5 g/dL (ref 3.5–5.0)
Alkaline Phosphatase: 38 U/L (ref 38–126)
Anion gap: 11 (ref 5–15)
BUN: 17 mg/dL (ref 6–20)
CO2: 23 mmol/L (ref 22–32)
Calcium: 10.1 mg/dL (ref 8.9–10.3)
Chloride: 103 mmol/L (ref 98–111)
Creatinine: 0.63 mg/dL (ref 0.44–1.00)
GFR, Estimated: >60 mL/min (ref >60)
Glucose, Bld: 234 mg/dL — ABNORMAL HIGH (ref 70–99)
Potassium: 3.7 mmol/L (ref 3.5–5.1)
Sodium: 137 mmol/L (ref 135–145)
Total Bilirubin: 1.3 mg/dL — ABNORMAL HIGH (ref <1.2)
Total Protein: 7.7 g/dL (ref 6.5–8.1)

## 2023-01-12 MED ORDER — FAMOTIDINE IN NACL 20-0.9 MG/50ML-% IV SOLN
20.0000 mg | Freq: Once | INTRAVENOUS | Status: AC
  Administered 2023-01-12: 20 mg via INTRAVENOUS
  Filled 2023-01-12: qty 50

## 2023-01-12 MED ORDER — SODIUM CHLORIDE 0.9 % IV SOLN: Freq: Once | INTRAVENOUS | Status: AC

## 2023-01-12 MED ORDER — SODIUM CHLORIDE 0.9 % IV SOLN
675.0000 mg | Freq: Once | INTRAVENOUS | Status: AC
  Administered 2023-01-12: 680 mg via INTRAVENOUS
  Filled 2023-01-12: qty 68

## 2023-01-12 MED ORDER — PALONOSETRON HCL INJECTION 0.25 MG/5ML
0.2500 mg | Freq: Once | INTRAVENOUS | Status: AC
Start: 2023-01-12 — End: 2023-01-12
  Administered 2023-01-12: 0.25 mg via INTRAVENOUS
  Filled 2023-01-12: qty 5

## 2023-01-12 MED ORDER — SODIUM CHLORIDE 0.9 % IV SOLN
150.0000 mg | Freq: Once | INTRAVENOUS | Status: AC
  Administered 2023-01-12: 150 mg via INTRAVENOUS
  Filled 2023-01-12: qty 150

## 2023-01-12 MED ORDER — PACLITAXEL CHEMO INJECTION 300 MG/50ML
140.0000 mg/m2 | Freq: Once | INTRAVENOUS | Status: AC
  Administered 2023-01-12: 306 mg via INTRAVENOUS
  Filled 2023-01-12: qty 51

## 2023-01-12 MED ORDER — CETIRIZINE HCL 10 MG/ML IV SOLN
10.0000 mg | Freq: Once | INTRAVENOUS | Status: AC
  Administered 2023-01-12: 10 mg via INTRAVENOUS
  Filled 2023-01-12: qty 1

## 2023-01-12 MED ORDER — DEXAMETHASONE SODIUM PHOSPHATE 10 MG/ML IJ SOLN
10.0000 mg | Freq: Once | INTRAMUSCULAR | Status: AC
Start: 2023-01-12 — End: 2023-01-12
  Administered 2023-01-12: 10 mg via INTRAVENOUS
  Filled 2023-01-12: qty 1

## 2023-01-12 NOTE — Patient Instructions (Signed)
 [facility] - A DEPT OF [facility]  Discharge Instructions: Thank you for choosing [facility] to provide your oncology and hematology care.   If you have a lab appointment with the Cancer Center, please go directly to the Cancer Center and check in at the registration area.   Wear comfortable clothing and clothing appropriate for easy access to any Portacath or PICC line.   We strive to give you quality time with your provider. You may need to reschedule your appointment if you arrive late (15 or more minutes).  Arriving late affects you and other patients whose appointments are after yours.  Also, if you miss three or more appointments without notifying the office, you may be dismissed from the clinic at the provider's discretion.      For prescription refill requests, have your pharmacy contact our office and allow 72 hours for refills to be completed.    Today you received the following chemotherapy and/or immunotherapy agents: Taxol/Carboplatin      To help prevent nausea and vomiting after your treatment, we encourage you to take your nausea medication as directed.  BELOW ARE SYMPTOMS THAT SHOULD BE REPORTED IMMEDIATELY: *FEVER GREATER THAN 100.4 F (38 C) OR HIGHER *CHILLS OR SWEATING *NAUSEA AND VOMITING THAT IS NOT CONTROLLED WITH YOUR NAUSEA MEDICATION *UNUSUAL SHORTNESS OF BREATH *UNUSUAL BRUISING OR BLEEDING *URINARY PROBLEMS (pain or burning when urinating, or frequent urination) *BOWEL PROBLEMS (unusual diarrhea, constipation, pain near the anus) TENDERNESS IN MOUTH AND THROAT WITH OR WITHOUT PRESENCE OF ULCERS (sore throat, sores in mouth, or a toothache) UNUSUAL RASH, SWELLING OR PAIN  UNUSUAL VAGINAL DISCHARGE OR ITCHING   Items with * indicate a potential emergency and should be followed up as soon as possible or go to the Emergency Department if any problems should occur.  Please show the CHEMOTHERAPY ALERT CARD or  IMMUNOTHERAPY ALERT CARD at check-in to the Emergency Department and triage nurse.  Should you have questions after your visit or need to cancel or reschedule your appointment, please contact [facility] - A DEPT OF [facility]  Dept: [phone]  and follow the prompts.  Office hours are 8:00 a.m. to 4:30 p.m. Monday - Friday. Please note that voicemails left after 4:00 p.m. may not be returned until the following business day.  We are closed weekends and major holidays. You have access to a nurse at all times for urgent questions. Please call the main number to the clinic Dept: [phone] and follow the prompts.   For any non-urgent questions, you may also contact your provider using MyChart. We now offer e-Visits for anyone 75 and older to request care online for non-urgent symptoms. For details visit [email or web address].   Also download the MyChart app! Go to the app store, search "MyChart", open the app, select [facility], and log in with your MyChart username and password.

## 2023-01-17 ENCOUNTER — Other Ambulatory Visit (HOSPITAL_BASED_OUTPATIENT_CLINIC_OR_DEPARTMENT_OTHER): Payer: Self-pay

## 2023-01-18 ENCOUNTER — Other Ambulatory Visit (HOSPITAL_BASED_OUTPATIENT_CLINIC_OR_DEPARTMENT_OTHER): Payer: Self-pay

## 2023-01-19 ENCOUNTER — Other Ambulatory Visit (HOSPITAL_BASED_OUTPATIENT_CLINIC_OR_DEPARTMENT_OTHER): Payer: Self-pay

## 2023-01-19 MED ORDER — METFORMIN HCL 1000 MG PO TABS
1000.0000 mg | ORAL_TABLET | Freq: Two times a day (BID) | ORAL | 0 refills | Status: AC
  Filled 2023-01-19 – 2023-02-02 (×2): qty 180, 90d supply, fill #0

## 2023-01-19 MED ORDER — FREESTYLE LANCETS MISC
0 refills | Status: AC
  Filled 2023-01-19: qty 100, 25d supply, fill #0

## 2023-01-19 MED ORDER — FREESTYLE LITE TEST VI STRP
ORAL_STRIP | 0 refills | Status: AC
  Filled 2023-01-19: qty 100, 25d supply, fill #0

## 2023-01-19 MED ORDER — JARDIANCE 10 MG PO TABS
10.0000 mg | ORAL_TABLET | Freq: Every morning | ORAL | 0 refills | Status: AC
  Filled 2023-01-19 – 2023-02-02 (×2): qty 90, 90d supply, fill #0

## 2023-01-21 ENCOUNTER — Other Ambulatory Visit: Payer: Commercial Managed Care - PPO

## 2023-01-21 ENCOUNTER — Ambulatory Visit: Payer: Commercial Managed Care - PPO

## 2023-01-29 ENCOUNTER — Other Ambulatory Visit (HOSPITAL_BASED_OUTPATIENT_CLINIC_OR_DEPARTMENT_OTHER): Payer: Self-pay

## 2023-02-02 ENCOUNTER — Other Ambulatory Visit: Payer: Self-pay | Admitting: Hematology and Oncology

## 2023-02-02 ENCOUNTER — Other Ambulatory Visit: Payer: Self-pay

## 2023-02-02 ENCOUNTER — Other Ambulatory Visit (HOSPITAL_BASED_OUTPATIENT_CLINIC_OR_DEPARTMENT_OTHER): Payer: Self-pay

## 2023-02-02 MED ORDER — OXYCODONE HCL 10 MG PO TABS
10.0000 mg | ORAL_TABLET | Freq: Four times a day (QID) | ORAL | 0 refills | Status: DC | PRN
  Filled 2023-02-02: qty 30, 7d supply, fill #0

## 2023-02-09 MED FILL — Fosaprepitant Dimeglumine For IV Infusion 150 MG (Base Eq): INTRAVENOUS | Qty: 5 | Status: AC

## 2023-02-10 ENCOUNTER — Inpatient Hospital Stay: Payer: Commercial Managed Care - PPO

## 2023-02-10 ENCOUNTER — Inpatient Hospital Stay: Payer: Commercial Managed Care - PPO | Admitting: Hematology and Oncology

## 2023-02-10 ENCOUNTER — Encounter: Payer: Self-pay | Admitting: Hematology and Oncology

## 2023-02-10 ENCOUNTER — Inpatient Hospital Stay: Payer: Commercial Managed Care - PPO | Attending: Psychiatry

## 2023-02-10 VITALS — BP 123/79 | HR 86 | Resp 18

## 2023-02-10 VITALS — BP 120/85 | HR 90 | Temp 99.4°F | Resp 18 | Ht 69.0 in | Wt 232.6 lb

## 2023-02-10 DIAGNOSIS — Z5111 Encounter for antineoplastic chemotherapy: Secondary | ICD-10-CM | POA: Diagnosis not present

## 2023-02-10 DIAGNOSIS — K7460 Unspecified cirrhosis of liver: Secondary | ICD-10-CM | POA: Insufficient documentation

## 2023-02-10 DIAGNOSIS — C561 Malignant neoplasm of right ovary: Secondary | ICD-10-CM | POA: Diagnosis not present

## 2023-02-10 DIAGNOSIS — D696 Thrombocytopenia, unspecified: Secondary | ICD-10-CM | POA: Insufficient documentation

## 2023-02-10 DIAGNOSIS — L409 Psoriasis, unspecified: Secondary | ICD-10-CM | POA: Insufficient documentation

## 2023-02-10 DIAGNOSIS — D61818 Other pancytopenia: Secondary | ICD-10-CM

## 2023-02-10 LAB — CMP (CANCER CENTER ONLY)
ALT: 28 U/L (ref 0–44)
AST: 26 U/L (ref 15–41)
Albumin: 5 g/dL (ref 3.5–5.0)
Alkaline Phosphatase: 48 U/L (ref 38–126)
Anion gap: 12 (ref 5–15)
BUN: 20 mg/dL (ref 6–20)
CO2: 26 mmol/L (ref 22–32)
Calcium: 10.8 mg/dL — ABNORMAL HIGH (ref 8.9–10.3)
Chloride: 100 mmol/L (ref 98–111)
Creatinine: 0.64 mg/dL (ref 0.44–1.00)
GFR, Estimated: >60 mL/min (ref >60)
Glucose, Bld: 176 mg/dL — ABNORMAL HIGH (ref 70–99)
Potassium: 4.4 mmol/L (ref 3.5–5.1)
Sodium: 138 mmol/L (ref 135–145)
Total Bilirubin: 1.3 mg/dL — ABNORMAL HIGH (ref <1.2)
Total Protein: 8 g/dL (ref 6.5–8.1)

## 2023-02-10 LAB — CBC WITH DIFFERENTIAL (CANCER CENTER ONLY)
Abs Immature Granulocytes: 0.07 10*3/uL (ref 0.00–0.07)
Basophils Absolute: 0 10*3/uL (ref 0.0–0.1)
Basophils Relative: 0 %
Eosinophils Absolute: 0 10*3/uL (ref 0.0–0.5)
Eosinophils Relative: 0 %
HCT: 43.4 % (ref 36.0–46.0)
Hemoglobin: 14.2 g/dL (ref 12.0–15.0)
Immature Granulocytes: 1 %
Lymphocytes Relative: 8 %
Lymphs Abs: 0.8 10*3/uL (ref 0.7–4.0)
MCH: 29.5 pg (ref 26.0–34.0)
MCHC: 32.7 g/dL (ref 30.0–36.0)
MCV: 90.2 fL (ref 80.0–100.0)
Monocytes Absolute: 0.2 10*3/uL (ref 0.1–1.0)
Monocytes Relative: 2 %
Neutro Abs: 8.5 10*3/uL — ABNORMAL HIGH (ref 1.7–7.7)
Neutrophils Relative %: 89 %
Platelet Count: 56 10*3/uL — ABNORMAL LOW (ref 150–400)
RBC: 4.81 MIL/uL (ref 3.87–5.11)
RDW: 16.1 % — ABNORMAL HIGH (ref 11.5–15.5)
WBC Count: 9.6 10*3/uL (ref 4.0–10.5)
nRBC: 0 % (ref 0.0–0.2)

## 2023-02-10 MED ORDER — SODIUM CHLORIDE 0.9 % IV SOLN
675.0000 mg | Freq: Once | INTRAVENOUS | Status: AC
  Administered 2023-02-10: 680 mg via INTRAVENOUS
  Filled 2023-02-10: qty 68

## 2023-02-10 MED ORDER — FAMOTIDINE IN NACL 20-0.9 MG/50ML-% IV SOLN
20.0000 mg | Freq: Once | INTRAVENOUS | Status: AC
  Administered 2023-02-10: 20 mg via INTRAVENOUS
  Filled 2023-02-10: qty 50

## 2023-02-10 MED ORDER — CETIRIZINE HCL 10 MG/ML IV SOLN
10.0000 mg | Freq: Once | INTRAVENOUS | Status: AC
  Administered 2023-02-10: 10 mg via INTRAVENOUS
  Filled 2023-02-10: qty 1

## 2023-02-10 MED ORDER — SODIUM CHLORIDE 0.9 % IV SOLN: Freq: Once | INTRAVENOUS | Status: AC

## 2023-02-10 MED ORDER — FOSAPREPITANT DIMEGLUMINE INJECTION 150 MG
150.0000 mg | Freq: Once | INTRAVENOUS | Status: AC
  Administered 2023-02-10: 150 mg via INTRAVENOUS
  Filled 2023-02-10: qty 150

## 2023-02-10 MED ORDER — PALONOSETRON HCL INJECTION 0.25 MG/5ML
0.2500 mg | Freq: Once | INTRAVENOUS | Status: AC
  Administered 2023-02-10: 0.25 mg via INTRAVENOUS
  Filled 2023-02-10: qty 5

## 2023-02-10 MED ORDER — SODIUM CHLORIDE 0.9 % IV SOLN
140.0000 mg/m2 | Freq: Once | INTRAVENOUS | Status: AC
  Administered 2023-02-10: 306 mg via INTRAVENOUS
  Filled 2023-02-10: qty 51

## 2023-02-10 MED ORDER — DEXAMETHASONE SODIUM PHOSPHATE 10 MG/ML IJ SOLN
10.0000 mg | Freq: Once | INTRAMUSCULAR | Status: AC
Start: 2023-02-10 — End: 2023-02-10
  Administered 2023-02-10: 10 mg via INTRAVENOUS
  Filled 2023-02-10: qty 1

## 2023-02-10 NOTE — Assessment & Plan Note (Signed)
She has baseline splenomegaly and liver cirrhosis I suspect her severe thrombocytopenia could be also related to her psoriasis I recommend the patient to come back next week for lab check and platelet transfusion if platelet count is less than 10,000 If her platelet count is normal, I recommend the patient to take prednisone for several days prior to her next treatment to see if we can improve her platelet count

## 2023-02-10 NOTE — Progress Notes (Signed)
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 Even though she had normal white blood cell count and hemoglobin, her platelet count remained low I suspect her thrombocytopenia is related to her psoriasis After long discussion, she is in agreement to proceed with treatment today She will come back next week for lab monitoring in case she need platelet transfusion   Thrombocytopenia (HCC) She has baseline splenomegaly and liver cirrhosis I suspect her severe thrombocytopenia could be also related to her psoriasis I recommend the patient to come back next week for lab check and platelet transfusion if platelet count is less than 10,000 If her platelet count is normal, I recommend the patient to take prednisone for several days prior to her next treatment to see if we can improve her platelet count  Orders Placed This Encounter  Procedures   CBC with Differential (Cancer Center Only)    Standing Status:   Future    Expected Date:   02/18/2023    Expiration Date:   02/10/2024    All questions were answered. The patient knows to call the clinic with any problems, questions or concerns. The total time spent in the appointment was 40 minutes encounter with patients including review of chart and various tests results, discussions about plan of care and coordination of care plan   Artis Delay, MD 02/10/2023 12:55 PM  INTERVAL HISTORY: Please see below for problem oriented charting. she returns for cycle 4 of treatment She continues to have intermittent flare of psoriasis She denies bleeding or excessive bruising We discussed her CBC results and plan of care  REVIEW OF SYSTEMS:   Constitutional: Denies fevers, chills or abnormal weight loss Eyes: Denies blurriness of vision Ears, nose, mouth, throat, and  face: Denies mucositis or sore throat Respiratory: Denies cough, dyspnea or wheezes Cardiovascular: Denies palpitation, chest discomfort or lower extremity swelling Gastrointestinal:  Denies nausea, heartburn or change in bowel habits Lymphatics: Denies new lymphadenopathy or easy bruising Neurological:Denies numbness, tingling or new weaknesses Behavioral/Psych: Mood is stable, no new changes  All other systems were reviewed with the patient and are negative.  I have reviewed the past medical history, past surgical history, social history and family history with the patient and they are unchanged from previous note.  ALLERGIES:  has no known allergies.  MEDICATIONS:  Current Outpatient Medications  Medication Sig Dispense Refill   Biotin 5000 MCG CAPS Take 5,000 mcg by mouth daily.     clobetasol cream (TEMOVATE) 0.05 % Apply 1 Application topically 2 (two) times daily for 14 days (Patient taking differently: Apply 1 Application topically 2 (two) times daily as needed (irritation).) 30 g 1   dexamethasone (DECADRON) 4 MG tablet Take 2 tablets the night before and 2 tablets the morning of chemotherapy, every 3 weeks for 6 cycles 24 tablet 6   empagliflozin (JARDIANCE) 10 MG TABS tablet Take 1 tablet (10 mg total) by mouth in the morning with or without food. 90 tablet 0   glucose blood (FREESTYLE LITE) test strip Use to check blood sugar 3-4 times a day 100 strip 0   Ixekizumab (TALTZ) 80 MG/ML SOSY Inject 1 ml Subcutaneous Every 4 weeks 30 days (Patient not taking: Reported on 10/28/2022) 1 mL 5   Lancets (FREESTYLE) lancets Use to test blood sugar 3 - 4 times a day 100  each 0   losartan-hydrochlorothiazide (HYZAAR) 50-12.5 MG tablet Take 1 tablet by mouth daily. (Blood pressure goal is 130/80) 90 tablet 3   metFORMIN (GLUCOPHAGE) 1000 MG tablet Take 1 tablet (1,000 mg total) by mouth 2 (two) times daily with a meal. 180 tablet 0   Multiple Vitamins-Minerals (MULTIVITAMIN GUMMIES WOMENS PO)  Take 2 each by mouth daily.     ondansetron (ZOFRAN) 8 MG tablet Take 1 tablet (8 mg total) by mouth every 8 (eight) hours as needed for nausea or vomiting (Begin 3 days after carboplatin). 30 tablet 1   Oxycodone HCl 10 MG TABS Take 1 tablet (10 mg total) by mouth every 6 (six) hours as needed. 30 tablet 0   predniSONE (DELTASONE) 10 MG tablet Take 6 tablets (60 mg total) by mouth daily with breakfast. 90 tablet 1   prochlorperazine (COMPAZINE) 10 MG tablet Take 1 tablet (10 mg total) by mouth every 6 (six) hours as needed for nausea or vomiting. 30 tablet 1   rosuvastatin (CRESTOR) 5 MG tablet Take 1 tablet (5 mg total) by mouth 3 (three) times a week. 36 tablet 1   Semaglutide, 2 MG/DOSE, (OZEMPIC, 2 MG/DOSE,) 8 MG/3ML SOPN Inject 2 mg into the skin once a week. 9 mL 1   SUMAtriptan (IMITREX) 100 MG tablet Take 1 tablet by mouth at onset of headach. ,Maybe repeat in 2 hours if still having migraine. Max daily dose of 200mg  per day. (Patient taking differently: Take 50 mg by mouth every 2 (two) hours as needed for migraine or headache. Take 0.5 tablet by mouth at onset of headach. ,Maybe repeat in 2 hours if still having migraine. Max daily dose of 200mg  per day.) 27 tablet 1   SUMAtriptan (IMITREX) 100 MG tablet Take 1 tablet at onset of headache, may be repeated in 2 hours if still having migraine (max daily dose 2 tablets) 15 tablet 3   No current facility-administered medications for this visit.   Facility-Administered Medications Ordered in Other Visits  Medication Dose Route Frequency Provider Last Rate Last Admin   CARBOplatin (PARAPLATIN) 680 mg in sodium chloride 0.9 % 250 mL chemo infusion  680 mg Intravenous Once Bertis Ruddy, Jerre Diguglielmo, MD       PACLitaxel (TAXOL) 306 mg in sodium chloride 0.9 % 500 mL chemo infusion (> 80mg /m2)  140 mg/m2 (Treatment Plan Recorded) Intravenous Once Artis Delay, MD 200 mL/hr at 02/10/23 1104 Infusion Verify at 02/10/23 1104    SUMMARY OF ONCOLOGIC  HISTORY: Oncology History  Granulosa cell carcinoma of ovary, right (HCC)  09/06/2022 Imaging   CT Renal Stone: IMPRESSION: 1. No obstructive uropathy. No nephrolithiasis. No acute inflammatory process identified within the abdomen or pelvis. 2. Large predominantly cystic mass adjacent to and inseparable from the right ovary and the fundus of the uterus measuring approximately 14.2 x 16.2 x 17.3 cm (AP x TR x CC) which exhibits multiple intervening mural nodules/hyperattenuating septations. This is indeterminate but concerning for neoplastic process arising from the right ovary or uterus. OBGYN consultation and contrast-enhanced MRI pelvis is recommended. 3. Grossly stable, ill-defined hypoattenuating right hepatic lobe segment 7 lesion, better evaluated on the prior MRI from 2019. 4. Multiple indeterminate retroperitoneal lymph nodes with short axis less than 1 cm. 5. Multiple other nonacute observations, as described above.   09/13/2022 Tumor Marker   Inhibin B: 963.6   09/24/2022 Imaging   MRI abdomen/pelvis: IMPRESSION: 1. Complex multi septated cystic lesion with internal enhancing solid components arising from the right adnexa measuring  up to 19.2 cm, consistent with ovarian neoplasm. 2. Prominent retroperitoneal iliac side chain and pelvic sidewall lymph nodes are similar dating back to 2019 and are favored to be reactive. 3. Cirrhotic liver morphology with splenomegaly. 4. Similar size of the 5.2 cm hemangioma in the posterior right lobe of the liver. 5. Cholelithiasis without findings of acute cholecystitis. Gallbladder wall thickening is favored sequela of chronic hepatic disease. 6. Well-circumscribed T2 hyperintense homogeneously enhancing anterior intramural uterine lesion measuring up to 4.5 cm, favored to reflect a degenerating fibroid.   10/12/2022 Cancer Staging   Staging form: Ovary, Fallopian Tube, and Primary Peritoneal Carcinoma, AJCC 8th Edition - Pathologic stage  from 10/12/2022: FIGO Stage IC2, calculated as Stage Unknown (pT1c2, pNX, cM0) - Signed by Clide Cliff, MD on 11/02/2022 Histopathologic type: Granulosa cell tumor, adult type Stage prefix: Initial diagnosis   10/12/2022 Surgery   Mini laparotomy for controlled cyst drainage, robotic assisted total laparoscopic hysterectomy, bilateral salpingo-oophorectomy, pelvic washings, infracolic omentectomy  Findings: On bimanual exam, mobile pelvic mass that arises to the level of the mid abdomen. On entry to abdomen, upper abdominal survey with nodular appearance of the liver, possibly consistent with cirrhosis. Otherwise smooth diaphragm, stomach and normal appearing omentum and bowel. Cystic and solid mass with smooth surface filling the pelvis. Controlled drainage of one portion of the cystic mass performed with yellow cyst fluid drained. On robotic portion of right salpingo-oophorectomy, incidental leakage of cyst fluid did occur. Remainder of mass drained and morcellated in a bag out of the supraumbilical incision with alexis retractor. 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 Normal appearing left fallopian tube and ovary. 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    10/12/2022 Pathology Results   A. 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 UTERUS, CERVIX, LEFT FALLOPIAN TUBE AND OVARY, RESECTION: - Uterus with benign leiomyoma, 5.4 cm - Benign proliferative endometrium - Benign unremarkable cervix - Benign unremarkable left fallopian tube and ovary - No evidence of malignancy  C. OMENTUM: - Portion of omentum, negative for tumor     COMMENT:  A.  Immunohistochemical stains show that the tumor cells  are positive for calretinin and inhibin while they are negative for CK7, synaptophysin, chromogranin and PAX8, consistent with above interpretation.  Dr. Venetia Night reviewed the case and concurs with the diagnosis.   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, adult type Histologic Grade: Not applicable Ovarian Surface Involvement: Present Fallopian Tube Surface Involvement: Not identified Implants: Not applicable Lymphatic and/or Vascular Invasion: Not identified Other Tissue/ Organ Involvement: Not applicable Largest Extrapelvic Peritoneal Focus: Not applicable Peritoneal/Ascitic Fluid Involvement: Not applicable Chemotherapy Response Score (CRS): Not applicable, no known presurgical therapy Regional Lymph Nodes: Not applicable (no lymph nodes submitted or found) Distant Metastasis:      Distant Site(s) Involved: Not applicable Pathologic Stage Classification (pTNM, AJCC 8th Edition): pT1c2, pN not assigned Ancillary Studies: Can be performed upon request Representative Tumor Block: A6    10/12/2022 Initial Diagnosis   Granulosa cell carcinoma of  ovary, right (HCC)   11/18/2022 -  Chemotherapy   Patient is on Treatment Plan : OVARIAN Carboplatin (AUC 6) + Paclitaxel (175) q21d X 6 Cycles       PHYSICAL EXAMINATION: ECOG PERFORMANCE STATUS: 1 - Symptomatic but completely ambulatory  Vitals:   02/10/23 0827  BP: 120/85  Pulse: 90  Resp: 18  Temp: 99.4 F (37.4 C)  SpO2: 96%   Filed Weights   02/10/23 0827  Weight: 232 lb 9.6 oz (105.5 kg)    GENERAL:alert, no distress and comfortable SKIN: Noted significant psoriasis NEURO: alert & oriented x 3 with fluent speech, no focal  motor/sensory deficits  LABORATORY DATA:  I have reviewed the data as listed    Component Value Date/Time   NA 138 02/10/2023 0756   K 4.4 02/10/2023 0756   CL 100 02/10/2023 0756   CO2 26 02/10/2023 0756   GLUCOSE 176 (H) 02/10/2023 0756   BUN 20 02/10/2023 0756   CREATININE 0.64 02/10/2023 0756   CALCIUM 10.8 (H) 02/10/2023 0756   PROT 8.0 02/10/2023 0756   ALBUMIN 5.0 02/10/2023 0756   AST 26 02/10/2023 0756   ALT 28 02/10/2023 0756   ALKPHOS 48 02/10/2023 0756   BILITOT 1.3 (H) 02/10/2023 0756   GFRNONAA >60 02/10/2023 0756    No results found for: "SPEP", "UPEP"  Lab Results  Component Value Date   WBC 9.6 02/10/2023   NEUTROABS 8.5 (H) 02/10/2023   HGB 14.2 02/10/2023   HCT 43.4 02/10/2023   MCV 90.2 02/10/2023   PLT 56 (L) 02/10/2023      Chemistry      Component Value Date/Time   NA 138 02/10/2023 0756   K 4.4 02/10/2023 0756   CL 100 02/10/2023 0756   CO2 26 02/10/2023 0756   BUN 20 02/10/2023 0756   CREATININE 0.64 02/10/2023 0756      Component Value Date/Time   CALCIUM 10.8 (H) 02/10/2023 0756   ALKPHOS 48 02/10/2023 0756   AST 26 02/10/2023 0756   ALT 28 02/10/2023 0756   BILITOT 1.3 (H) 02/10/2023 0756

## 2023-02-10 NOTE — Progress Notes (Signed)
Extravasation/Infiltration Adverse Event Note  Date of Occurrence: 02/10/23  Time of Occurrence: 1040  Time of Discovery: 1042  Drug Involved: Paclitaxel   Approximate Amount of Drug Infiltrated (ml): 6-9 ml  Approximate Amount of Drug Remaining in Syringe/Bag (mL): 540  Route of Administration:   LDA Type: PIV  LDA Needle Gauge: 24 G  LDA Needle/Catheter Length: 0.75in  LDA Location: Right  and Forearm   LDA Access/Placement Time: 02/10/23 0930  Number of Access Attempts: 1   Patient Complaints and Clinical Presentation:   Patient Symptoms: Pain and Redness  Clinical Assessment:  Site Dimensions (mm): 7mm at insertion site  Site Color: Grade 2 - red  Site Integrity: Grade 0 - Unbroken  Additional Assessments: redness tracking up vein to [facility] space, outlined with skin marker   Clinical Actions:   Time of IV disconnect: 1042   Attempted Aspiration Amount (ml): <1 ml   Heat/Cold Pack Initiated: Yes  If indicated, compress type: Cold Compress  Antidote Administered per Provider Order: Not applicable  If indicated, name of antidote administered:    Provider Notification and Instructions:   Provider Notified: Daphane Shepherd, PA-C  Time of Provider Notification: 1046  Provider-Specific Instructions: outline skin redness with marker, apply cold pack, patient to return for monitoring in 24hrs, 48hrs and 1 week   Patient Follow-Up Instructions and Return Appointments:  Follow-Up Instructions: pt will return for nurse visit to follow up   Discharge Checklist:      Extravasation Patient Teaching Instruction Sheet provided to patient: Yes   Appointments scheduled (24 hour, 48 hour, and 1 week follow-ups): Yes     Additional Information about the Extravasation/Infiltration Event:  Patient reports she was adjusting her arms and felt a slight stinging at IV insertion site. About 2 minutes later she told RN about pain and RN noted redness.

## 2023-02-10 NOTE — Assessment & Plan Note (Signed)
She has severe thrombocytopenia Even though she had normal white blood cell count and hemoglobin, her platelet count remained low I suspect her thrombocytopenia is related to her psoriasis After long discussion, she is in agreement to proceed with treatment today She will come back next week for lab monitoring in case she need platelet transfusion

## 2023-02-11 ENCOUNTER — Inpatient Hospital Stay: Payer: Commercial Managed Care - PPO

## 2023-02-11 ENCOUNTER — Telehealth: Payer: Self-pay | Admitting: Hematology and Oncology

## 2023-02-11 NOTE — Telephone Encounter (Signed)
Spoke with patient confirming upcoming appointments  

## 2023-02-11 NOTE — Progress Notes (Unsigned)
Extravasation and Infiltration Follow-Up Assessment Note:     Follow-Up Appointment (24-Hours After Occurrence)   Date: 02/11/23  Time: 0915  Name of RN Completing Assessment: Henriette Combs  Clinical Assessment:  Site Dimensions (mm): N/A (swelling dissipated)   Site Color: Grade 0 - normal   Site Integrity: Grade 0 - Unbroken  Additional Assessments: No redness or swelling noted at extravasation site. Small amount of dark streaking noted at vein above IV site. Patient reports mild tenderness which continues to improve.  Plan of Care:   Continue cold packs as needed, follow up with RN visit tomorrow at 0900.   Follow-Up Care Reviewed with Patient: yes  Upcoming Follow-Up Appointments Reviewed with Patient: yes

## 2023-02-12 ENCOUNTER — Inpatient Hospital Stay (HOSPITAL_BASED_OUTPATIENT_CLINIC_OR_DEPARTMENT_OTHER): Payer: Commercial Managed Care - PPO | Admitting: Hematology and Oncology

## 2023-02-12 VITALS — BP 120/81 | HR 77 | Temp 97.0°F | Resp 15

## 2023-02-12 DIAGNOSIS — D696 Thrombocytopenia, unspecified: Secondary | ICD-10-CM | POA: Diagnosis not present

## 2023-02-12 DIAGNOSIS — Z5111 Encounter for antineoplastic chemotherapy: Secondary | ICD-10-CM | POA: Diagnosis not present

## 2023-02-12 DIAGNOSIS — L409 Psoriasis, unspecified: Secondary | ICD-10-CM | POA: Diagnosis not present

## 2023-02-12 DIAGNOSIS — K7460 Unspecified cirrhosis of liver: Secondary | ICD-10-CM | POA: Diagnosis not present

## 2023-02-12 DIAGNOSIS — C561 Malignant neoplasm of right ovary: Secondary | ICD-10-CM | POA: Diagnosis not present

## 2023-02-12 NOTE — Progress Notes (Signed)
Extravasation and Infiltration Follow-Up Assessment Note:     Follow-Up Appointment (48-Hours After Occurrence)   Date: 02/12/23  Time: 0858  Name of RN Completing Assessment: Delight Stare  Clinical Assessment:  Site Dimensions (mm): N/A   Site Color: Grade 0 - normal   Site Integrity: Grade 0 - Unbroken  Additional Assessments: Patient's skin is noticeably improved upon assessment today. Not red, but dark streaking of the vein. Patient has some pain at site and has been utilizing ice on it.   Plan of Care:   Patient scheduled for next follow-up on Friday 02/18/23. She is aware.   Follow-Up Care Reviewed with Patient: yes  Upcoming Follow-Up Appointments Reviewed with Patient: yes

## 2023-02-13 ENCOUNTER — Other Ambulatory Visit: Payer: Self-pay

## 2023-02-18 ENCOUNTER — Inpatient Hospital Stay: Payer: Commercial Managed Care - PPO

## 2023-02-18 DIAGNOSIS — Z5111 Encounter for antineoplastic chemotherapy: Secondary | ICD-10-CM | POA: Diagnosis not present

## 2023-02-18 DIAGNOSIS — D61818 Other pancytopenia: Secondary | ICD-10-CM

## 2023-02-18 DIAGNOSIS — C561 Malignant neoplasm of right ovary: Secondary | ICD-10-CM | POA: Diagnosis not present

## 2023-02-18 DIAGNOSIS — D696 Thrombocytopenia, unspecified: Secondary | ICD-10-CM | POA: Diagnosis not present

## 2023-02-18 DIAGNOSIS — L409 Psoriasis, unspecified: Secondary | ICD-10-CM | POA: Diagnosis not present

## 2023-02-18 DIAGNOSIS — K7460 Unspecified cirrhosis of liver: Secondary | ICD-10-CM | POA: Diagnosis not present

## 2023-02-18 LAB — CBC WITH DIFFERENTIAL (CANCER CENTER ONLY)
Abs Immature Granulocytes: 0.06 10*3/uL (ref 0.00–0.07)
Basophils Absolute: 0 10*3/uL (ref 0.0–0.1)
Basophils Relative: 1 %
Eosinophils Absolute: 0 10*3/uL (ref 0.0–0.5)
Eosinophils Relative: 1 %
HCT: 36.6 % (ref 36.0–46.0)
Hemoglobin: 12.3 g/dL (ref 12.0–15.0)
Immature Granulocytes: 1 %
Lymphocytes Relative: 29 %
Lymphs Abs: 1.5 10*3/uL (ref 0.7–4.0)
MCH: 30 pg (ref 26.0–34.0)
MCHC: 33.6 g/dL (ref 30.0–36.0)
MCV: 89.3 fL (ref 80.0–100.0)
Monocytes Absolute: 0.2 10*3/uL (ref 0.1–1.0)
Monocytes Relative: 4 %
Neutro Abs: 3.2 10*3/uL (ref 1.7–7.7)
Neutrophils Relative %: 64 %
Platelet Count: 62 10*3/uL — ABNORMAL LOW (ref 150–400)
RBC: 4.1 MIL/uL (ref 3.87–5.11)
RDW: 15.3 % (ref 11.5–15.5)
WBC Count: 5 10*3/uL (ref 4.0–10.5)
nRBC: 0 % (ref 0.0–0.2)

## 2023-02-23 ENCOUNTER — Other Ambulatory Visit: Payer: Self-pay

## 2023-03-03 ENCOUNTER — Other Ambulatory Visit: Payer: Self-pay

## 2023-03-03 NOTE — Progress Notes (Signed)
 Disenrolling - taltz last filled 7.23.24

## 2023-03-08 ENCOUNTER — Other Ambulatory Visit: Payer: Self-pay

## 2023-03-08 ENCOUNTER — Other Ambulatory Visit (HOSPITAL_BASED_OUTPATIENT_CLINIC_OR_DEPARTMENT_OTHER): Payer: Self-pay

## 2023-03-08 ENCOUNTER — Other Ambulatory Visit: Payer: Self-pay | Admitting: Hematology and Oncology

## 2023-03-08 MED ORDER — PREDNISONE 10 MG PO TABS
60.0000 mg | ORAL_TABLET | Freq: Every day | ORAL | 1 refills | Status: DC
  Filled 2023-03-08: qty 90, 15d supply, fill #0

## 2023-03-10 ENCOUNTER — Inpatient Hospital Stay: Payer: Commercial Managed Care - PPO

## 2023-03-10 ENCOUNTER — Inpatient Hospital Stay: Payer: Commercial Managed Care - PPO | Admitting: Hematology and Oncology

## 2023-03-10 ENCOUNTER — Encounter: Payer: Self-pay | Admitting: Hematology and Oncology

## 2023-03-10 ENCOUNTER — Inpatient Hospital Stay: Payer: Commercial Managed Care - PPO | Attending: Psychiatry

## 2023-03-10 VITALS — BP 130/82 | HR 76 | Resp 17

## 2023-03-10 VITALS — BP 122/87 | HR 85 | Temp 97.7°F | Resp 18 | Ht 69.0 in | Wt 236.6 lb

## 2023-03-10 DIAGNOSIS — Z5111 Encounter for antineoplastic chemotherapy: Secondary | ICD-10-CM | POA: Diagnosis not present

## 2023-03-10 DIAGNOSIS — R161 Splenomegaly, not elsewhere classified: Secondary | ICD-10-CM | POA: Insufficient documentation

## 2023-03-10 DIAGNOSIS — D696 Thrombocytopenia, unspecified: Secondary | ICD-10-CM | POA: Insufficient documentation

## 2023-03-10 DIAGNOSIS — C561 Malignant neoplasm of right ovary: Secondary | ICD-10-CM

## 2023-03-10 DIAGNOSIS — L4050 Arthropathic psoriasis, unspecified: Secondary | ICD-10-CM | POA: Insufficient documentation

## 2023-03-10 DIAGNOSIS — K7460 Unspecified cirrhosis of liver: Secondary | ICD-10-CM | POA: Diagnosis not present

## 2023-03-10 LAB — CMP (CANCER CENTER ONLY)
ALT: 33 U/L (ref 0–44)
AST: 21 U/L (ref 15–41)
Albumin: 4.6 g/dL (ref 3.5–5.0)
Alkaline Phosphatase: 43 U/L (ref 38–126)
Anion gap: 12 (ref 5–15)
BUN: 19 mg/dL (ref 6–20)
CO2: 27 mmol/L (ref 22–32)
Calcium: 10.5 mg/dL — ABNORMAL HIGH (ref 8.9–10.3)
Chloride: 100 mmol/L (ref 98–111)
Creatinine: 0.55 mg/dL (ref 0.44–1.00)
GFR, Estimated: >60 mL/min (ref >60)
Glucose, Bld: 153 mg/dL — ABNORMAL HIGH (ref 70–99)
Potassium: 3.8 mmol/L (ref 3.5–5.1)
Sodium: 139 mmol/L (ref 135–145)
Total Bilirubin: 1.3 mg/dL — ABNORMAL HIGH (ref 0.0–1.2)
Total Protein: 7.4 g/dL (ref 6.5–8.1)

## 2023-03-10 LAB — CBC WITH DIFFERENTIAL (CANCER CENTER ONLY)
Abs Immature Granulocytes: 0.08 10*3/uL — ABNORMAL HIGH (ref 0.00–0.07)
Basophils Absolute: 0 10*3/uL (ref 0.0–0.1)
Basophils Relative: 0 %
Eosinophils Absolute: 0 10*3/uL (ref 0.0–0.5)
Eosinophils Relative: 0 %
HCT: 41.5 % (ref 36.0–46.0)
Hemoglobin: 13.7 g/dL (ref 12.0–15.0)
Immature Granulocytes: 1 %
Lymphocytes Relative: 10 %
Lymphs Abs: 1 10*3/uL (ref 0.7–4.0)
MCH: 29.2 pg (ref 26.0–34.0)
MCHC: 33 g/dL (ref 30.0–36.0)
MCV: 88.5 fL (ref 80.0–100.0)
Monocytes Absolute: 0.6 10*3/uL (ref 0.1–1.0)
Monocytes Relative: 6 %
Neutro Abs: 8.1 10*3/uL — ABNORMAL HIGH (ref 1.7–7.7)
Neutrophils Relative %: 83 %
Platelet Count: 52 10*3/uL — ABNORMAL LOW (ref 150–400)
RBC: 4.69 MIL/uL (ref 3.87–5.11)
RDW: 15.9 % — ABNORMAL HIGH (ref 11.5–15.5)
WBC Count: 9.8 10*3/uL (ref 4.0–10.5)
nRBC: 0 % (ref 0.0–0.2)

## 2023-03-10 MED ORDER — PALONOSETRON HCL INJECTION 0.25 MG/5ML
0.2500 mg | Freq: Once | INTRAVENOUS | Status: AC
Start: 2023-03-10 — End: 2023-03-10
  Administered 2023-03-10: 0.25 mg via INTRAVENOUS
  Filled 2023-03-10: qty 5

## 2023-03-10 MED ORDER — SODIUM CHLORIDE 0.9 % IV SOLN: Freq: Once | INTRAVENOUS | Status: AC

## 2023-03-10 MED ORDER — PACLITAXEL CHEMO INJECTION 300 MG/50ML
140.0000 mg/m2 | Freq: Once | INTRAVENOUS | Status: AC
  Administered 2023-03-10: 306 mg via INTRAVENOUS
  Filled 2023-03-10: qty 51

## 2023-03-10 MED ORDER — SODIUM CHLORIDE 0.9 % IV SOLN
675.0000 mg | Freq: Once | INTRAVENOUS | Status: AC
  Administered 2023-03-10: 680 mg via INTRAVENOUS
  Filled 2023-03-10: qty 68

## 2023-03-10 MED ORDER — DEXAMETHASONE SODIUM PHOSPHATE 10 MG/ML IJ SOLN
10.0000 mg | Freq: Once | INTRAMUSCULAR | Status: AC
  Administered 2023-03-10: 10 mg via INTRAVENOUS
  Filled 2023-03-10: qty 1

## 2023-03-10 MED ORDER — SODIUM CHLORIDE 0.9 % IV SOLN
150.0000 mg | Freq: Once | INTRAVENOUS | Status: AC
  Administered 2023-03-10: 150 mg via INTRAVENOUS
  Filled 2023-03-10: qty 150

## 2023-03-10 MED ORDER — FAMOTIDINE IN NACL 20-0.9 MG/50ML-% IV SOLN
20.0000 mg | Freq: Once | INTRAVENOUS | Status: AC
  Administered 2023-03-10: 20 mg via INTRAVENOUS
  Filled 2023-03-10: qty 50

## 2023-03-10 MED ORDER — CETIRIZINE HCL 10 MG/ML IV SOLN
10.0000 mg | Freq: Once | INTRAVENOUS | Status: AC
  Administered 2023-03-10: 10 mg via INTRAVENOUS
  Filled 2023-03-10: qty 1

## 2023-03-10 NOTE — Assessment & Plan Note (Signed)
Despite the plan to add prednisone to treat other causes of her thrombocytopenia, her platelet count remained low We will proceed with treatment today I suspect the main cause of her low platelet is due to sequestration from splenomegaly The patient has been educated to watch out for signs and symptoms of bleeding

## 2023-03-10 NOTE — Patient Instructions (Signed)
 [facility] - A DEPT OF [facility]  Discharge Instructions: Thank you for choosing [facility] to provide your oncology and hematology care.   If you have a lab appointment with the Cancer Center, please go directly to the Cancer Center and check in at the registration area.   Wear comfortable clothing and clothing appropriate for easy access to any Portacath or PICC line.   We strive to give you quality time with your provider. You may need to reschedule your appointment if you arrive late (15 or more minutes).  Arriving late affects you and other patients whose appointments are after yours.  Also, if you miss three or more appointments without notifying the office, you may be dismissed from the clinic at the provider's discretion.      For prescription refill requests, have your pharmacy contact our office and allow 72 hours for refills to be completed.    Today you received the following chemotherapy and/or immunotherapy agents: Paclitaxel and Carboplatin      To help prevent nausea and vomiting after your treatment, we encourage you to take your nausea medication as directed.  BELOW ARE SYMPTOMS THAT SHOULD BE REPORTED IMMEDIATELY: *FEVER GREATER THAN 100.4 F (38 C) OR HIGHER *CHILLS OR SWEATING *NAUSEA AND VOMITING THAT IS NOT CONTROLLED WITH YOUR NAUSEA MEDICATION *UNUSUAL SHORTNESS OF BREATH *UNUSUAL BRUISING OR BLEEDING *URINARY PROBLEMS (pain or burning when urinating, or frequent urination) *BOWEL PROBLEMS (unusual diarrhea, constipation, pain near the anus) TENDERNESS IN MOUTH AND THROAT WITH OR WITHOUT PRESENCE OF ULCERS (sore throat, sores in mouth, or a toothache) UNUSUAL RASH, SWELLING OR PAIN  UNUSUAL VAGINAL DISCHARGE OR ITCHING   Items with * indicate a potential emergency and should be followed up as soon as possible or go to the Emergency Department if any problems should occur.  Please show the CHEMOTHERAPY ALERT CARD  or IMMUNOTHERAPY ALERT CARD at check-in to the Emergency Department and triage nurse.  Should you have questions after your visit or need to cancel or reschedule your appointment, please contact [facility] - A DEPT OF [facility]  Dept: [phone]  and follow the prompts.  Office hours are 8:00 a.m. to 4:30 p.m. Monday - Friday. Please note that voicemails left after 4:00 p.m. may not be returned until the following business day.  We are closed weekends and major holidays. You have access to a nurse at all times for urgent questions. Please call the main number to the clinic Dept: [phone] and follow the prompts.   For any non-urgent questions, you may also contact your provider using MyChart. We now offer e-Visits for anyone 68 and older to request care online for non-urgent symptoms. For details visit [email or web address].   Also download the MyChart app! Go to the app store, search "MyChart", open the app, select [facility], and log in with your MyChart username and password.

## 2023-03-10 NOTE — Assessment & Plan Note (Signed)
She has baseline splenomegaly and liver cirrhosis Giving her addition of prednisone did not improve her platelet count and she did not require transfusion support We will observe only

## 2023-03-10 NOTE — Progress Notes (Signed)
[facility] OFFICE PROGRESS NOTE  Patient Care Team: Koren Shiver, DO as PCP - General (Family Medicine)  ASSESSMENT & PLAN:  Granulosa cell carcinoma of ovary, right (HCC) Despite the plan to add prednisone to treat other causes of her thrombocytopenia, her platelet count remained low We will proceed with treatment today I suspect the main cause of her low platelet is due to sequestration from splenomegaly The patient has been educated to watch out for signs and symptoms of bleeding  Thrombocytopenia (HCC) She has baseline splenomegaly and liver cirrhosis Giving her addition of prednisone did not improve her platelet count and she did not require transfusion support We will observe only  Psoriatic arthritis (HCC) She has prednisone to take at home as needed for intermittent flare of psoriasis  No orders of the defined types were placed in this encounter.   All questions were answered. The patient knows to call the clinic with any problems, questions or concerns. The total time spent in the appointment was 30 minutes encounter with patients including review of chart and various tests results, discussions about plan of care and coordination of care plan   Artis Delay, MD 03/10/2023 10:26 AM  INTERVAL HISTORY: Please see below for problem oriented charting. she returns for chemotherapy follow-up Since last time I saw her, she is doing well She had extravasation but that has resolved No peripheral neuropathy from treatment Denies recent bleeding We discussed test results  REVIEW OF SYSTEMS:   Constitutional: Denies fevers, chills or abnormal weight loss Eyes: Denies blurriness of vision Ears, nose, mouth, throat, and face: Denies mucositis or sore throat Respiratory: Denies cough, dyspnea or wheezes Cardiovascular: Denies palpitation, chest discomfort or lower extremity swelling Gastrointestinal:  Denies nausea, heartburn or change in bowel  habits Lymphatics: Denies new lymphadenopathy or easy bruising Neurological:Denies numbness, tingling or new weaknesses Behavioral/Psych: Mood is stable, no new changes  All other systems were reviewed with the patient and are negative.  I have reviewed the past medical history, past surgical history, social history and family history with the patient and they are unchanged from previous note.  ALLERGIES:  has no known allergies.  MEDICATIONS:  Current Outpatient Medications  Medication Sig Dispense Refill   Biotin 5000 MCG CAPS Take 5,000 mcg by mouth daily.     clobetasol cream (TEMOVATE) 0.05 % Apply 1 Application topically 2 (two) times daily for 14 days (Patient taking differently: Apply 1 Application topically 2 (two) times daily as needed (irritation).) 30 g 1   dexamethasone (DECADRON) 4 MG tablet Take 2 tablets the night before and 2 tablets the morning of chemotherapy, every 3 weeks for 6 cycles 24 tablet 6   empagliflozin (JARDIANCE) 10 MG TABS tablet Take 1 tablet (10 mg total) by mouth in the morning with or without food. 90 tablet 0   glucose blood (FREESTYLE LITE) test strip Use to check blood sugar 3-4 times a day 100 strip 0   Ixekizumab (TALTZ) 80 MG/ML SOSY Inject 1 ml Subcutaneous Every 4 weeks 30 days (Patient not taking: Reported on 10/28/2022) 1 mL 5   Lancets (FREESTYLE) lancets Use to test blood sugar 3 - 4 times a day 100 each 0   losartan-hydrochlorothiazide (HYZAAR) 50-12.5 MG tablet Take 1 tablet by mouth daily. (Blood pressure goal is 130/80) 90 tablet 3   metFORMIN (GLUCOPHAGE) 1000 MG tablet Take 1 tablet (1,000 mg total) by mouth 2 (two) times daily with a meal. 180 tablet 0   Multiple Vitamins-Minerals (  MULTIVITAMIN GUMMIES WOMENS PO) Take 2 each by mouth daily.     ondansetron (ZOFRAN) 8 MG tablet Take 1 tablet (8 mg total) by mouth every 8 (eight) hours as needed for nausea or vomiting (Begin 3 days after carboplatin). 30 tablet 1   Oxycodone HCl 10 MG TABS  Take 1 tablet (10 mg total) by mouth every 6 (six) hours as needed. 30 tablet 0   prochlorperazine (COMPAZINE) 10 MG tablet Take 1 tablet (10 mg total) by mouth every 6 (six) hours as needed for nausea or vomiting. 30 tablet 1   rosuvastatin (CRESTOR) 5 MG tablet Take 1 tablet (5 mg total) by mouth 3 (three) times a week. 36 tablet 1   Semaglutide, 2 MG/DOSE, (OZEMPIC, 2 MG/DOSE,) 8 MG/3ML SOPN Inject 2 mg into the skin once a week. 9 mL 1   SUMAtriptan (IMITREX) 100 MG tablet Take 1 tablet by mouth at onset of headach. ,Maybe repeat in 2 hours if still having migraine. Max daily dose of 200mg  per day. (Patient taking differently: Take 50 mg by mouth every 2 (two) hours as needed for migraine or headache. Take 0.5 tablet by mouth at onset of headach. ,Maybe repeat in 2 hours if still having migraine. Max daily dose of 200mg  per day.) 27 tablet 1   SUMAtriptan (IMITREX) 100 MG tablet Take 1 tablet at onset of headache, may be repeated in 2 hours if still having migraine (max daily dose 2 tablets) 15 tablet 3   No current facility-administered medications for this visit.   Facility-Administered Medications Ordered in Other Visits  Medication Dose Route Frequency Provider Last Rate Last Admin   0.9 %  sodium chloride infusion   Intravenous Once Bertis Ruddy, Muneeb Veras, MD       CARBOplatin (PARAPLATIN) 680 mg in sodium chloride 0.9 % 250 mL chemo infusion  680 mg Intravenous Once Bertis Ruddy, Foday Cone, MD       famotidine (PEPCID) IVPB 20 mg premix  20 mg Intravenous Once Bertis Ruddy, Sidonie Dexheimer, MD       fosaprepitant (EMEND) 150 mg in sodium chloride 0.9 % 145 mL IVPB  150 mg Intravenous Once Bertis Ruddy, Chloey Ricard, MD       PACLitaxel (TAXOL) 306 mg in sodium chloride 0.9 % 500 mL chemo infusion (> 80mg /m2)  140 mg/m2 (Treatment Plan Recorded) Intravenous Once Artis Delay, MD        SUMMARY OF ONCOLOGIC HISTORY: Oncology History  Granulosa cell carcinoma of ovary, right (HCC)  09/06/2022 Imaging   CT Renal Stone: IMPRESSION: 1. No  obstructive uropathy. No nephrolithiasis. No acute inflammatory process identified within the abdomen or pelvis. 2. Large predominantly cystic mass adjacent to and inseparable from the right ovary and the fundus of the uterus measuring approximately 14.2 x 16.2 x 17.3 cm (AP x TR x CC) which exhibits multiple intervening mural nodules/hyperattenuating septations. This is indeterminate but concerning for neoplastic process arising from the right ovary or uterus. OBGYN consultation and contrast-enhanced MRI pelvis is recommended. 3. Grossly stable, ill-defined hypoattenuating right hepatic lobe segment 7 lesion, better evaluated on the prior MRI from 2019. 4. Multiple indeterminate retroperitoneal lymph nodes with short axis less than 1 cm. 5. Multiple other nonacute observations, as described above.   09/13/2022 Tumor Marker   Inhibin B: 963.6   09/24/2022 Imaging   MRI abdomen/pelvis: IMPRESSION: 1. Complex multi septated cystic lesion with internal enhancing solid components arising from the right adnexa measuring up to 19.2 cm, consistent with ovarian neoplasm. 2. Prominent retroperitoneal iliac side chain  and pelvic sidewall lymph nodes are similar dating back to 2019 and are favored to be reactive. 3. Cirrhotic liver morphology with splenomegaly. 4. Similar size of the 5.2 cm hemangioma in the posterior right lobe of the liver. 5. Cholelithiasis without findings of acute cholecystitis. Gallbladder wall thickening is favored sequela of chronic hepatic disease. 6. Well-circumscribed T2 hyperintense homogeneously enhancing anterior intramural uterine lesion measuring up to 4.5 cm, favored to reflect a degenerating fibroid.   10/12/2022 Cancer Staging   Staging form: Ovary, Fallopian Tube, and Primary Peritoneal Carcinoma, AJCC 8th Edition - Pathologic stage from 10/12/2022: FIGO Stage IC2, calculated as Stage Unknown (pT1c2, pNX, cM0) - Signed by Clide Cliff, MD on  11/02/2022 Histopathologic type: Granulosa cell tumor, adult type Stage prefix: Initial diagnosis   10/12/2022 Surgery   Mini laparotomy for controlled cyst drainage, robotic assisted total laparoscopic hysterectomy, bilateral salpingo-oophorectomy, pelvic washings, infracolic omentectomy  Findings: On bimanual exam, mobile pelvic mass that arises to the level of the mid abdomen. On entry to abdomen, upper abdominal survey with nodular appearance of the liver, possibly consistent with cirrhosis. Otherwise smooth diaphragm, stomach and normal appearing omentum and bowel. Cystic and solid mass with smooth surface filling the pelvis. Controlled drainage of one portion of the cystic mass performed with yellow cyst fluid drained. On robotic portion of right salpingo-oophorectomy, incidental leakage of cyst fluid did occur. Remainder of mass drained and morcellated in a bag out of the supraumbilical incision with alexis retractor. 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 Normal appearing left fallopian tube and ovary. 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    10/12/2022 Pathology Results   A. 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 UTERUS, CERVIX, LEFT FALLOPIAN TUBE AND OVARY, RESECTION: - Uterus with benign leiomyoma, 5.4 cm - Benign proliferative endometrium - Benign unremarkable cervix - Benign unremarkable left fallopian tube and ovary - No evidence of malignancy  C. OMENTUM: - Portion of omentum, negative for tumor     COMMENT:  A.  Immunohistochemical stains show that the tumor cells are positive for calretinin and inhibin while they are negative for CK7, synaptophysin, chromogranin and PAX8,  consistent with above interpretation.  Dr. Venetia Night reviewed the case and concurs with the diagnosis.   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, adult type Histologic Grade: Not applicable Ovarian Surface Involvement: Present Fallopian Tube Surface Involvement: Not identified Implants: Not applicable Lymphatic and/or Vascular Invasion: Not identified Other Tissue/ Organ Involvement: Not applicable Largest Extrapelvic Peritoneal Focus: Not applicable Peritoneal/Ascitic Fluid Involvement: Not applicable Chemotherapy Response Score (CRS): Not applicable, no known presurgical therapy Regional Lymph Nodes: Not applicable (no lymph nodes submitted or found) Distant Metastasis:      Distant Site(s) Involved: Not applicable Pathologic Stage Classification (pTNM, AJCC 8th Edition): pT1c2, pN not assigned Ancillary Studies: Can be performed upon request Representative Tumor Block: A6    10/12/2022 Initial Diagnosis   Granulosa cell carcinoma of ovary, right (HCC)   11/18/2022 -  Chemotherapy   Patient is on  Treatment Plan : OVARIAN Carboplatin (AUC 6) + Paclitaxel (175) q21d X 6 Cycles       PHYSICAL EXAMINATION: ECOG PERFORMANCE STATUS: 1 - Symptomatic but completely ambulatory  Vitals:   03/10/23 0931  BP: 122/87  Pulse: 85  Resp: 18  Temp: 97.7 F (36.5 C)  SpO2: 96%   Filed Weights   03/10/23 0931  Weight: 236 lb 9.6 oz (107.3 kg)    GENERAL:alert, no distress and comfortable NEURO: alert & oriented x 3 with fluent speech, no focal motor/sensory deficits  LABORATORY DATA:  I have reviewed the data as listed    Component Value Date/Time   NA 139 03/10/2023 0857   K 3.8  03/10/2023 0857   CL 100 03/10/2023 0857   CO2 27 03/10/2023 0857   GLUCOSE 153 (H) 03/10/2023 0857   BUN 19 03/10/2023 0857   CREATININE 0.55 03/10/2023 0857   CALCIUM 10.5 (H) 03/10/2023 0857   PROT 7.4 03/10/2023 0857   ALBUMIN 4.6 03/10/2023 0857   AST 21 03/10/2023 0857   ALT 33 03/10/2023 0857   ALKPHOS 43 03/10/2023 0857   BILITOT 1.3 (H) 03/10/2023 0857   GFRNONAA >60 03/10/2023 0857    No results found for: "SPEP", "UPEP"  Lab Results  Component Value Date   WBC 9.8 03/10/2023   NEUTROABS 8.1 (H) 03/10/2023   HGB 13.7 03/10/2023   HCT 41.5 03/10/2023   MCV 88.5 03/10/2023   PLT 52 (L) 03/10/2023      Chemistry      Component Value Date/Time   NA 139 03/10/2023 0857   K 3.8 03/10/2023 0857   CL 100 03/10/2023 0857   CO2 27 03/10/2023 0857   BUN 19 03/10/2023 0857   CREATININE 0.55 03/10/2023 0857      Component Value Date/Time   CALCIUM 10.5 (H) 03/10/2023 0857   ALKPHOS 43 03/10/2023 0857   AST 21 03/10/2023 0857   ALT 33 03/10/2023 0857   BILITOT 1.3 (H) 03/10/2023 0857

## 2023-03-10 NOTE — Assessment & Plan Note (Signed)
She has prednisone to take at home as needed for intermittent flare of psoriasis

## 2023-03-11 ENCOUNTER — Other Ambulatory Visit (HOSPITAL_BASED_OUTPATIENT_CLINIC_OR_DEPARTMENT_OTHER): Payer: Self-pay

## 2023-03-16 ENCOUNTER — Other Ambulatory Visit: Payer: Self-pay | Admitting: Hematology and Oncology

## 2023-03-16 ENCOUNTER — Other Ambulatory Visit (HOSPITAL_BASED_OUTPATIENT_CLINIC_OR_DEPARTMENT_OTHER): Payer: Self-pay

## 2023-03-16 MED ORDER — OXYCODONE HCL 10 MG PO TABS
10.0000 mg | ORAL_TABLET | Freq: Four times a day (QID) | ORAL | 0 refills | Status: DC | PRN
  Filled 2023-03-16: qty 30, 8d supply, fill #0

## 2023-03-17 ENCOUNTER — Other Ambulatory Visit (HOSPITAL_BASED_OUTPATIENT_CLINIC_OR_DEPARTMENT_OTHER): Payer: Self-pay

## 2023-03-17 ENCOUNTER — Other Ambulatory Visit: Payer: Self-pay | Admitting: Hematology and Oncology

## 2023-03-17 ENCOUNTER — Telehealth: Payer: Self-pay | Admitting: Oncology

## 2023-03-17 MED ORDER — PREDNISONE 10 MG PO TABS
30.0000 mg | ORAL_TABLET | Freq: Every day | ORAL | 0 refills | Status: AC
  Filled 2023-03-17: qty 90, 30d supply, fill #0

## 2023-03-17 NOTE — Telephone Encounter (Signed)
done

## 2023-03-17 NOTE — Telephone Encounter (Signed)
Kirsten Rodriguez called and asked for a refill of prednisone 10 mg (takes 3 tablets daily).  She said Dr. Bertis Ruddy told her she could take it for flare ups of psoriatic arthritis.  She took her last 2 tablets this morning.  She uses the outpatient pharmacy at [facility].

## 2023-03-17 NOTE — Telephone Encounter (Signed)
Left a message advising refill has been sent.

## 2023-03-30 ENCOUNTER — Other Ambulatory Visit (HOSPITAL_BASED_OUTPATIENT_CLINIC_OR_DEPARTMENT_OTHER): Payer: Self-pay

## 2023-03-30 MED ORDER — SERTRALINE HCL 50 MG PO TABS
50.0000 mg | ORAL_TABLET | Freq: Every day | ORAL | 0 refills | Status: DC
  Filled 2023-03-30: qty 30, 30d supply, fill #0

## 2023-04-06 MED FILL — Fosaprepitant Dimeglumine For IV Infusion 150 MG (Base Eq): INTRAVENOUS | Qty: 5 | Status: AC

## 2023-04-07 ENCOUNTER — Inpatient Hospital Stay: Payer: Commercial Managed Care - PPO | Admitting: Hematology and Oncology

## 2023-04-07 ENCOUNTER — Encounter: Payer: Self-pay | Admitting: Hematology and Oncology

## 2023-04-07 ENCOUNTER — Inpatient Hospital Stay: Payer: Commercial Managed Care - PPO

## 2023-04-07 ENCOUNTER — Inpatient Hospital Stay: Payer: Commercial Managed Care - PPO | Attending: Psychiatry

## 2023-04-07 VITALS — BP 123/80 | HR 96 | Temp 99.2°F | Resp 18 | Ht 69.0 in | Wt 236.6 lb

## 2023-04-07 DIAGNOSIS — C561 Malignant neoplasm of right ovary: Secondary | ICD-10-CM

## 2023-04-07 DIAGNOSIS — Z5111 Encounter for antineoplastic chemotherapy: Secondary | ICD-10-CM | POA: Diagnosis not present

## 2023-04-07 DIAGNOSIS — L4050 Arthropathic psoriasis, unspecified: Secondary | ICD-10-CM | POA: Diagnosis not present

## 2023-04-07 DIAGNOSIS — D696 Thrombocytopenia, unspecified: Secondary | ICD-10-CM | POA: Diagnosis not present

## 2023-04-07 DIAGNOSIS — K7460 Unspecified cirrhosis of liver: Secondary | ICD-10-CM | POA: Insufficient documentation

## 2023-04-07 LAB — CMP (CANCER CENTER ONLY)
ALT: 44 U/L (ref 0–44)
AST: 27 U/L (ref 15–41)
Albumin: 4.5 g/dL (ref 3.5–5.0)
Alkaline Phosphatase: 46 U/L (ref 38–126)
Anion gap: 10 (ref 5–15)
BUN: 15 mg/dL (ref 6–20)
CO2: 28 mmol/L (ref 22–32)
Calcium: 10 mg/dL (ref 8.9–10.3)
Chloride: 99 mmol/L (ref 98–111)
Creatinine: 0.57 mg/dL (ref 0.44–1.00)
GFR, Estimated: >60 mL/min (ref >60)
Glucose, Bld: 224 mg/dL — ABNORMAL HIGH (ref 70–99)
Potassium: 4.4 mmol/L (ref 3.5–5.1)
Sodium: 137 mmol/L (ref 135–145)
Total Bilirubin: 1.3 mg/dL — ABNORMAL HIGH (ref 0.0–1.2)
Total Protein: 7.4 g/dL (ref 6.5–8.1)

## 2023-04-07 LAB — CBC WITH DIFFERENTIAL (CANCER CENTER ONLY)
Abs Immature Granulocytes: 0.11 10*3/uL — ABNORMAL HIGH (ref 0.00–0.07)
Basophils Absolute: 0 10*3/uL (ref 0.0–0.1)
Basophils Relative: 0 %
Eosinophils Absolute: 0 10*3/uL (ref 0.0–0.5)
Eosinophils Relative: 0 %
HCT: 42.1 % (ref 36.0–46.0)
Hemoglobin: 13.8 g/dL (ref 12.0–15.0)
Immature Granulocytes: 1 %
Lymphocytes Relative: 9 %
Lymphs Abs: 0.8 10*3/uL (ref 0.7–4.0)
MCH: 28.9 pg (ref 26.0–34.0)
MCHC: 32.8 g/dL (ref 30.0–36.0)
MCV: 88.3 fL (ref 80.0–100.0)
Monocytes Absolute: 0.3 10*3/uL (ref 0.1–1.0)
Monocytes Relative: 4 %
Neutro Abs: 7.7 10*3/uL (ref 1.7–7.7)
Neutrophils Relative %: 86 %
Platelet Count: 67 10*3/uL — ABNORMAL LOW (ref 150–400)
RBC: 4.77 MIL/uL (ref 3.87–5.11)
RDW: 15.4 % (ref 11.5–15.5)
WBC Count: 9.1 10*3/uL (ref 4.0–10.5)
nRBC: 0 % (ref 0.0–0.2)

## 2023-04-07 MED ORDER — SODIUM CHLORIDE 0.9 % IV SOLN
675.0000 mg | Freq: Once | INTRAVENOUS | Status: AC
  Administered 2023-04-07: 680 mg via INTRAVENOUS
  Filled 2023-04-07: qty 68

## 2023-04-07 MED ORDER — FAMOTIDINE IN NACL 20-0.9 MG/50ML-% IV SOLN
20.0000 mg | Freq: Once | INTRAVENOUS | Status: AC
Start: 2023-04-07 — End: 2023-04-07
  Administered 2023-04-07: 20 mg via INTRAVENOUS
  Filled 2023-04-07: qty 50

## 2023-04-07 MED ORDER — SODIUM CHLORIDE 0.9 % IV SOLN: Freq: Once | INTRAVENOUS | Status: AC

## 2023-04-07 MED ORDER — PALONOSETRON HCL INJECTION 0.25 MG/5ML
0.2500 mg | Freq: Once | INTRAVENOUS | Status: AC
  Administered 2023-04-07: 0.25 mg via INTRAVENOUS
  Filled 2023-04-07: qty 5

## 2023-04-07 MED ORDER — SODIUM CHLORIDE 0.9 % IV SOLN
150.0000 mg | Freq: Once | INTRAVENOUS | Status: AC
  Administered 2023-04-07: 150 mg via INTRAVENOUS
  Filled 2023-04-07: qty 150

## 2023-04-07 MED ORDER — CETIRIZINE HCL 10 MG/ML IV SOLN
10.0000 mg | Freq: Once | INTRAVENOUS | Status: AC
  Administered 2023-04-07: 10 mg via INTRAVENOUS
  Filled 2023-04-07: qty 1

## 2023-04-07 MED ORDER — DEXAMETHASONE SODIUM PHOSPHATE 10 MG/ML IJ SOLN
10.0000 mg | Freq: Once | INTRAMUSCULAR | Status: AC
  Administered 2023-04-07: 10 mg via INTRAVENOUS
  Filled 2023-04-07: qty 1

## 2023-04-07 MED ORDER — SODIUM CHLORIDE 0.9 % IV SOLN
140.0000 mg/m2 | Freq: Once | INTRAVENOUS | Status: AC
  Administered 2023-04-07: 306 mg via INTRAVENOUS
  Filled 2023-04-07: qty 51

## 2023-04-07 NOTE — Assessment & Plan Note (Signed)
We will proceed with treatment today I suspect the main cause of her low platelet is due to sequestration from splenomegaly After completion of chemotherapy today, I will order imaging study for objective assessment of response to therapy next month

## 2023-04-07 NOTE — Assessment & Plan Note (Signed)
She has prednisone to take at home as needed for intermittent flare of psoriasis

## 2023-04-07 NOTE — Assessment & Plan Note (Signed)
She has baseline splenomegaly and liver cirrhosis Giving her addition of prednisone did not improve her platelet count and she did not require transfusion support We will observe only

## 2023-04-07 NOTE — Progress Notes (Signed)
[facility] OFFICE PROGRESS NOTE  Patient Care Team: Koren Shiver, DO as PCP - General (Family Medicine)  ASSESSMENT & PLAN:  Granulosa cell carcinoma of ovary, right (HCC) We will proceed with treatment today I suspect the main cause of her low platelet is due to sequestration from splenomegaly After completion of chemotherapy today, I will order imaging study for objective assessment of response to therapy next month  Thrombocytopenia (HCC) She has baseline splenomegaly and liver cirrhosis Giving her addition of prednisone did not improve her platelet count and she did not require transfusion support We will observe only  Psoriatic arthritis (HCC) She has prednisone to take at home as needed for intermittent flare of psoriasis  Orders Placed This Encounter  Procedures   CT ABDOMEN PELVIS W CONTRAST    Standing Status:   Future    Expected Date:   05/05/2023    Expiration Date:   04/06/2024    Scheduling Instructions:     No need oral contrast    If indicated for the ordered procedure, I authorize the administration of contrast media per Radiology protocol:   Yes    Does the patient have a contrast media/X-ray dye allergy?:   No    Is patient pregnant?:   No    Preferred imaging location?:   [facility]    If indicated for the ordered procedure, I authorize the administration of oral contrast media per Radiology protocol:   Yes    All questions were answered. The patient knows to call the clinic with any problems, questions or concerns. The total time spent in the appointment was 30 minutes encounter with patients including review of chart and various tests results, discussions about plan of care and coordination of care plan   Artis Delay, MD 04/07/2023 12:15 PM  INTERVAL HISTORY: Please see below for problem oriented charting. she returns for chemo follow-up with her husband Since last time I saw her, she tolerated recent treatment well No  recent bleeding Denies peripheral neuropathy She has intermittent flare of arthritis pain but she has prednisone and pain medicine to take as needed  REVIEW OF SYSTEMS:   Constitutional: Denies fevers, chills or abnormal weight loss Eyes: Denies blurriness of vision Ears, nose, mouth, throat, and face: Denies mucositis or sore throat Respiratory: Denies cough, dyspnea or wheezes Cardiovascular: Denies palpitation, chest discomfort or lower extremity swelling Gastrointestinal:  Denies nausea, heartburn or change in bowel habits Skin: Denies abnormal skin rashes Lymphatics: Denies new lymphadenopathy or easy bruising Neurological:Denies numbness, tingling or new weaknesses Behavioral/Psych: Mood is stable, no new changes  All other systems were reviewed with the patient and are negative.  I have reviewed the past medical history, past surgical history, social history and family history with the patient and they are unchanged from previous note.  ALLERGIES:  has no known allergies.  MEDICATIONS:  Current Outpatient Medications  Medication Sig Dispense Refill   Biotin 5000 MCG CAPS Take 5,000 mcg by mouth daily.     clobetasol cream (TEMOVATE) 0.05 % Apply 1 Application topically 2 (two) times daily for 14 days (Patient taking differently: Apply 1 Application topically 2 (two) times daily as needed (irritation).) 30 g 1   dexamethasone (DECADRON) 4 MG tablet Take 2 tablets the night before and 2 tablets the morning of chemotherapy, every 3 weeks for 6 cycles 24 tablet 6   empagliflozin (JARDIANCE) 10 MG TABS tablet Take 1 tablet (10 mg total) by mouth in the morning with  or without food. 90 tablet 0   glucose blood (FREESTYLE LITE) test strip Use to check blood sugar 3-4 times a day 100 strip 0   Ixekizumab (TALTZ) 80 MG/ML SOSY Inject 1 ml Subcutaneous Every 4 weeks 30 days (Patient not taking: Reported on 10/28/2022) 1 mL 5   Lancets (FREESTYLE) lancets Use to test blood sugar 3 - 4 times a  day 100 each 0   losartan-hydrochlorothiazide (HYZAAR) 50-12.5 MG tablet Take 1 tablet by mouth daily. (Blood pressure goal is 130/80) 90 tablet 3   metFORMIN (GLUCOPHAGE) 1000 MG tablet Take 1 tablet (1,000 mg total) by mouth 2 (two) times daily with a meal. 180 tablet 0   Multiple Vitamins-Minerals (MULTIVITAMIN GUMMIES WOMENS PO) Take 2 each by mouth daily.     ondansetron (ZOFRAN) 8 MG tablet Take 1 tablet (8 mg total) by mouth every 8 (eight) hours as needed for nausea or vomiting (Begin 3 days after carboplatin). 30 tablet 1   Oxycodone HCl 10 MG TABS Take 1 tablet (10 mg total) by mouth every 6 (six) hours as needed. 30 tablet 0   predniSONE (DELTASONE) 10 MG tablet Take 3 tablets (30 mg total) by mouth daily with breakfast. 90 tablet 0   prochlorperazine (COMPAZINE) 10 MG tablet Take 1 tablet (10 mg total) by mouth every 6 (six) hours as needed for nausea or vomiting. 30 tablet 1   rosuvastatin (CRESTOR) 5 MG tablet Take 1 tablet (5 mg total) by mouth 3 (three) times a week. 36 tablet 1   Semaglutide, 2 MG/DOSE, (OZEMPIC, 2 MG/DOSE,) 8 MG/3ML SOPN Inject 2 mg into the skin once a week. 9 mL 1   sertraline (ZOLOFT) 50 MG tablet Take 1 tablet (50 mg total) by mouth daily. 30 tablet 0   SUMAtriptan (IMITREX) 100 MG tablet Take 1 tablet by mouth at onset of headach. ,Maybe repeat in 2 hours if still having migraine. Max daily dose of 200mg  per day. (Patient taking differently: Take 50 mg by mouth every 2 (two) hours as needed for migraine or headache. Take 0.5 tablet by mouth at onset of headach. ,Maybe repeat in 2 hours if still having migraine. Max daily dose of 200mg  per day.) 27 tablet 1   SUMAtriptan (IMITREX) 100 MG tablet Take 1 tablet at onset of headache, may be repeated in 2 hours if still having migraine (max daily dose 2 tablets) 15 tablet 3   No current facility-administered medications for this visit.   Facility-Administered Medications Ordered in Other Visits  Medication Dose  Route Frequency Provider Last Rate Last Admin   CARBOplatin (PARAPLATIN) 680 mg in sodium chloride 0.9 % 250 mL chemo infusion  680 mg Intravenous Once Bertis Ruddy, Lyndon Chapel, MD       PACLitaxel (TAXOL) 306 mg in sodium chloride 0.9 % 500 mL chemo infusion (> 80mg /m2)  140 mg/m2 (Treatment Plan Recorded) Intravenous Once Artis Delay, MD 184 mL/hr at 04/07/23 1124 306 mg at 04/07/23 1124    SUMMARY OF ONCOLOGIC HISTORY: Oncology History  Granulosa cell carcinoma of ovary, right (HCC)  09/06/2022 Imaging   CT Renal Stone: IMPRESSION: 1. No obstructive uropathy. No nephrolithiasis. No acute inflammatory process identified within the abdomen or pelvis. 2. Large predominantly cystic mass adjacent to and inseparable from the right ovary and the fundus of the uterus measuring approximately 14.2 x 16.2 x 17.3 cm (AP x TR x CC) which exhibits multiple intervening mural nodules/hyperattenuating septations. This is indeterminate but concerning for neoplastic process arising from the right  ovary or uterus. OBGYN consultation and contrast-enhanced MRI pelvis is recommended. 3. Grossly stable, ill-defined hypoattenuating right hepatic lobe segment 7 lesion, better evaluated on the prior MRI from 2019. 4. Multiple indeterminate retroperitoneal lymph nodes with short axis less than 1 cm. 5. Multiple other nonacute observations, as described above.   09/13/2022 Tumor Marker   Inhibin B: 963.6   09/24/2022 Imaging   MRI abdomen/pelvis: IMPRESSION: 1. Complex multi septated cystic lesion with internal enhancing solid components arising from the right adnexa measuring up to 19.2 cm, consistent with ovarian neoplasm. 2. Prominent retroperitoneal iliac side chain and pelvic sidewall lymph nodes are similar dating back to 2019 and are favored to be reactive. 3. Cirrhotic liver morphology with splenomegaly. 4. Similar size of the 5.2 cm hemangioma in the posterior right lobe of the liver. 5. Cholelithiasis without findings  of acute cholecystitis. Gallbladder wall thickening is favored sequela of chronic hepatic disease. 6. Well-circumscribed T2 hyperintense homogeneously enhancing anterior intramural uterine lesion measuring up to 4.5 cm, favored to reflect a degenerating fibroid.   10/12/2022 Cancer Staging   Staging form: Ovary, Fallopian Tube, and Primary Peritoneal Carcinoma, AJCC 8th Edition - Pathologic stage from 10/12/2022: FIGO Stage IC2, calculated as Stage Unknown (pT1c2, pNX, cM0) - Signed by Clide Cliff, MD on 11/02/2022 Histopathologic type: Granulosa cell tumor, adult type Stage prefix: Initial diagnosis   10/12/2022 Surgery   Mini laparotomy for controlled cyst drainage, robotic assisted total laparoscopic hysterectomy, bilateral salpingo-oophorectomy, pelvic washings, infracolic omentectomy  Findings: On bimanual exam, mobile pelvic mass that arises to the level of the mid abdomen. On entry to abdomen, upper abdominal survey with nodular appearance of the liver, possibly consistent with cirrhosis. Otherwise smooth diaphragm, stomach and normal appearing omentum and bowel. Cystic and solid mass with smooth surface filling the pelvis. Controlled drainage of one portion of the cystic mass performed with yellow cyst fluid drained. On robotic portion of right salpingo-oophorectomy, incidental leakage of cyst fluid did occur. Remainder of mass drained and morcellated in a bag out of the supraumbilical incision with alexis retractor. 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 Normal appearing left fallopian tube and ovary. 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    10/12/2022 Pathology Results   A. 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 UTERUS, CERVIX, LEFT FALLOPIAN TUBE AND OVARY, RESECTION: - Uterus with benign leiomyoma, 5.4 cm - Benign proliferative endometrium - Benign unremarkable cervix - Benign unremarkable left fallopian tube and ovary - No evidence of malignancy  C. OMENTUM: - Portion of omentum, negative for tumor     COMMENT:  A.  Immunohistochemical stains show that the tumor cells are positive for calretinin and inhibin while they are negative for CK7, synaptophysin, chromogranin and PAX8, consistent with above interpretation.  Dr. Venetia Night reviewed the case and concurs with the diagnosis.   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, adult type Histologic Grade: Not applicable Ovarian Surface Involvement: Present Fallopian Tube Surface Involvement: Not identified Implants: Not applicable Lymphatic and/or Vascular  Invasion: Not identified Other Tissue/ Organ Involvement: Not applicable Largest Extrapelvic Peritoneal Focus: Not applicable Peritoneal/Ascitic Fluid Involvement: Not applicable Chemotherapy Response Score (CRS): Not applicable, no known presurgical therapy Regional Lymph Nodes: Not applicable (no lymph nodes submitted or found) Distant Metastasis:      Distant Site(s) Involved: Not applicable Pathologic Stage Classification (pTNM, AJCC 8th Edition): pT1c2, pN not assigned Ancillary Studies: Can be performed upon request Representative Tumor Block: A6    10/12/2022 Initial Diagnosis   Granulosa cell carcinoma of ovary, right (HCC)   11/18/2022 -  Chemotherapy   Patient is on Treatment Plan : OVARIAN Carboplatin (AUC 6) + Paclitaxel (175) q21d X 6 Cycles        PHYSICAL EXAMINATION: ECOG PERFORMANCE STATUS: 1 - Symptomatic but completely ambulatory  Vitals:   04/07/23 0829  BP: 123/80  Pulse: 96  Resp: 18  Temp: 99.2 F (37.3 C)  SpO2: 99%   Filed Weights   04/07/23 0829  Weight: 236 lb 9.6 oz (107.3 kg)    GENERAL:alert, no distress and comfortable SKIN: Noted skin flaking and psoriatic plaque NEURO: alert & oriented x 3 with fluent speech, no focal motor/sensory deficits  LABORATORY DATA:  I have reviewed the data as listed    Component Value Date/Time   NA 137 04/07/2023 0759   K 4.4 04/07/2023 0759   CL 99 04/07/2023 0759   CO2 28 04/07/2023 0759   GLUCOSE 224 (H) 04/07/2023 0759   BUN 15 04/07/2023 0759   CREATININE 0.57 04/07/2023 0759   CALCIUM 10.0 04/07/2023 0759   PROT 7.4 04/07/2023 0759   ALBUMIN 4.5 04/07/2023 0759   AST 27 04/07/2023 0759   ALT 44 04/07/2023 0759   ALKPHOS 46 04/07/2023 0759   BILITOT 1.3 (H) 04/07/2023 0759   GFRNONAA >60 04/07/2023 0759    No results found for: "SPEP", "UPEP"  Lab Results  Component Value Date   WBC 9.1 04/07/2023   NEUTROABS 7.7 04/07/2023   HGB 13.8 04/07/2023   HCT 42.1 04/07/2023   MCV 88.3 04/07/2023   PLT 67 (L) 04/07/2023      Chemistry      Component Value Date/Time   NA 137 04/07/2023 0759   K 4.4 04/07/2023 0759   CL 99 04/07/2023 0759   CO2 28 04/07/2023 0759   BUN 15 04/07/2023 0759   CREATININE 0.57 04/07/2023 0759      Component Value Date/Time   CALCIUM 10.0 04/07/2023 0759   ALKPHOS 46 04/07/2023 0759   AST 27 04/07/2023 0759   ALT 44 04/07/2023 0759   BILITOT 1.3 (H) 04/07/2023 0759

## 2023-04-07 NOTE — Patient Instructions (Signed)
[facility] - A DEPT OF [facility]  Discharge Instructions: Thank you for choosing [facility] to provide your oncology and hematology care.   If you have a lab appointment with the Cancer Center, please go directly to the Cancer Center and check in at the registration area.   Wear comfortable clothing and clothing appropriate for easy access to any Portacath or PICC line.   We strive to give you quality time with your provider. You may need to reschedule your appointment if you arrive late (15 or more minutes).  Arriving late affects you and other patients whose appointments are after yours.  Also, if you miss three or more appointments without notifying the office, you may be dismissed from the clinic at the provider's discretion.      For prescription refill requests, have your pharmacy contact our office and allow 72 hours for refills to be completed.    Today you received the following chemotherapy and/or immunotherapy agents: Paclitaxel & Carboplatin      To help prevent nausea and vomiting after your treatment, we encourage you to take your nausea medication as directed.  BELOW ARE SYMPTOMS THAT SHOULD BE REPORTED IMMEDIATELY: *FEVER GREATER THAN 100.4 F (38 C) OR HIGHER *CHILLS OR SWEATING *NAUSEA AND VOMITING THAT IS NOT CONTROLLED WITH YOUR NAUSEA MEDICATION *UNUSUAL SHORTNESS OF BREATH *UNUSUAL BRUISING OR BLEEDING *URINARY PROBLEMS (pain or burning when urinating, or frequent urination) *BOWEL PROBLEMS (unusual diarrhea, constipation, pain near the anus) TENDERNESS IN MOUTH AND THROAT WITH OR WITHOUT PRESENCE OF ULCERS (sore throat, sores in mouth, or a toothache) UNUSUAL RASH, SWELLING OR PAIN  UNUSUAL VAGINAL DISCHARGE OR ITCHING   Items with * indicate a potential emergency and should be followed up as soon as possible or go to the Emergency Department if any problems should occur.  Please show the CHEMOTHERAPY ALERT CARD or  IMMUNOTHERAPY ALERT CARD at check-in to the Emergency Department and triage nurse.  Should you have questions after your visit or need to cancel or reschedule your appointment, please contact [facility] - A DEPT OF [facility]  Dept: [phone]  and follow the prompts.  Office hours are 8:00 a.m. to 4:30 p.m. Monday - Friday. Please note that voicemails left after 4:00 p.m. may not be returned until the following business day.  We are closed weekends and major holidays. You have access to a nurse at all times for urgent questions. Please call the main number to the clinic Dept: [phone] and follow the prompts.   For any non-urgent questions, you may also contact your provider using MyChart. We now offer e-Visits for anyone 79 and older to request care online for non-urgent symptoms. For details visit [email or web address].   Also download the MyChart app! Go to the app store, search "MyChart", open the app, select [place], and log in with your MyChart username and password.

## 2023-04-08 ENCOUNTER — Telehealth: Payer: Self-pay | Admitting: Hematology and Oncology

## 2023-04-08 NOTE — Telephone Encounter (Signed)
Spoke with patient confirming upcoming appointment

## 2023-04-09 ENCOUNTER — Other Ambulatory Visit: Payer: Self-pay

## 2023-04-14 ENCOUNTER — Telehealth: Payer: Self-pay

## 2023-04-14 NOTE — Telephone Encounter (Signed)
 Called and left a message asking her to call the office. She scheduled CT on 3/19 instead of 3/13. MD appt is schedule 3/20. Ask her to call the office back to move MD appt out 1 week or she can call reschedule CT to 3/13.

## 2023-04-27 DIAGNOSIS — J069 Acute upper respiratory infection, unspecified: Secondary | ICD-10-CM | POA: Diagnosis not present

## 2023-05-02 ENCOUNTER — Other Ambulatory Visit (HOSPITAL_BASED_OUTPATIENT_CLINIC_OR_DEPARTMENT_OTHER): Payer: Self-pay

## 2023-05-02 ENCOUNTER — Other Ambulatory Visit: Payer: Self-pay | Admitting: Hematology and Oncology

## 2023-05-02 MED ORDER — OXYCODONE HCL 10 MG PO TABS
10.0000 mg | ORAL_TABLET | Freq: Four times a day (QID) | ORAL | 0 refills | Status: AC | PRN
  Filled 2023-05-02: qty 30, 8d supply, fill #0

## 2023-05-03 ENCOUNTER — Encounter (HOSPITAL_BASED_OUTPATIENT_CLINIC_OR_DEPARTMENT_OTHER): Payer: Self-pay | Admitting: Pharmacist

## 2023-05-03 ENCOUNTER — Other Ambulatory Visit (HOSPITAL_BASED_OUTPATIENT_CLINIC_OR_DEPARTMENT_OTHER): Payer: Self-pay

## 2023-05-05 ENCOUNTER — Ambulatory Visit (HOSPITAL_BASED_OUTPATIENT_CLINIC_OR_DEPARTMENT_OTHER)
Admission: RE | Admit: 2023-05-05 | Discharge: 2023-05-05 | Disposition: A | Discharge status: Home / Self Care | Payer: Commercial Managed Care - PPO | Source: Ambulatory Visit | Attending: Hematology and Oncology | Admitting: Hematology and Oncology

## 2023-05-05 DIAGNOSIS — K7460 Unspecified cirrhosis of liver: Secondary | ICD-10-CM | POA: Diagnosis not present

## 2023-05-05 DIAGNOSIS — D1803 Hemangioma of intra-abdominal structures: Secondary | ICD-10-CM | POA: Diagnosis not present

## 2023-05-05 DIAGNOSIS — K766 Portal hypertension: Secondary | ICD-10-CM | POA: Diagnosis not present

## 2023-05-05 DIAGNOSIS — C561 Malignant neoplasm of right ovary: Secondary | ICD-10-CM | POA: Insufficient documentation

## 2023-05-05 MED ORDER — IOHEXOL 300 MG/ML  SOLN
100.0000 mL | Freq: Once | INTRAMUSCULAR | Status: AC | PRN
  Administered 2023-05-05: 100 mL via INTRAVENOUS

## 2023-05-09 NOTE — Assessment & Plan Note (Signed)
 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

## 2023-05-10 ENCOUNTER — Telehealth: Payer: Self-pay | Admitting: Oncology

## 2023-05-10 DIAGNOSIS — C561 Malignant neoplasm of right ovary: Secondary | ICD-10-CM

## 2023-05-10 NOTE — Telephone Encounter (Signed)
 Left a message regarding genetic counseling referral.  Requested a return call.

## 2023-05-11 ENCOUNTER — Other Ambulatory Visit (HOSPITAL_BASED_OUTPATIENT_CLINIC_OR_DEPARTMENT_OTHER): Payer: Commercial Managed Care - PPO

## 2023-05-12 ENCOUNTER — Inpatient Hospital Stay (HOSPITAL_BASED_OUTPATIENT_CLINIC_OR_DEPARTMENT_OTHER): Payer: Commercial Managed Care - PPO | Admitting: Hematology and Oncology

## 2023-05-12 ENCOUNTER — Encounter: Payer: Self-pay | Admitting: Hematology and Oncology

## 2023-05-12 ENCOUNTER — Telehealth: Payer: Self-pay

## 2023-05-12 ENCOUNTER — Other Ambulatory Visit: Payer: Self-pay

## 2023-05-12 ENCOUNTER — Inpatient Hospital Stay: Payer: Commercial Managed Care - PPO | Attending: Psychiatry

## 2023-05-12 VITALS — BP 112/67 | HR 83 | Temp 97.7°F | Resp 16 | Wt 242.5 lb

## 2023-05-12 DIAGNOSIS — D696 Thrombocytopenia, unspecified: Secondary | ICD-10-CM | POA: Diagnosis not present

## 2023-05-12 DIAGNOSIS — L4050 Arthropathic psoriasis, unspecified: Secondary | ICD-10-CM

## 2023-05-12 DIAGNOSIS — C561 Malignant neoplasm of right ovary: Secondary | ICD-10-CM

## 2023-05-12 DIAGNOSIS — R5383 Other fatigue: Secondary | ICD-10-CM | POA: Insufficient documentation

## 2023-05-12 LAB — CMP (CANCER CENTER ONLY)
ALT: 43 U/L (ref 0–44)
AST: 54 U/L — ABNORMAL HIGH (ref 15–41)
Albumin: 4.2 g/dL (ref 3.5–5.0)
Alkaline Phosphatase: 39 U/L (ref 38–126)
Anion gap: 8 (ref 5–15)
BUN: 11 mg/dL (ref 6–20)
CO2: 31 mmol/L (ref 22–32)
Calcium: 9.5 mg/dL (ref 8.9–10.3)
Chloride: 100 mmol/L (ref 98–111)
Creatinine: 0.47 mg/dL (ref 0.44–1.00)
GFR, Estimated: >60 mL/min (ref >60)
Glucose, Bld: 166 mg/dL — ABNORMAL HIGH (ref 70–99)
Potassium: 2.9 mmol/L — ABNORMAL LOW (ref 3.5–5.1)
Sodium: 139 mmol/L (ref 135–145)
Total Bilirubin: 1.6 mg/dL — ABNORMAL HIGH (ref 0.0–1.2)
Total Protein: 6.8 g/dL (ref 6.5–8.1)

## 2023-05-12 LAB — CBC WITH DIFFERENTIAL (CANCER CENTER ONLY)
Abs Immature Granulocytes: 0.01 10*3/uL (ref 0.00–0.07)
Basophils Absolute: 0 10*3/uL (ref 0.0–0.1)
Basophils Relative: 1 %
Eosinophils Absolute: 0.1 10*3/uL (ref 0.0–0.5)
Eosinophils Relative: 4 %
HCT: 37.7 % (ref 36.0–46.0)
Hemoglobin: 12.3 g/dL (ref 12.0–15.0)
Immature Granulocytes: 0 %
Lymphocytes Relative: 29 %
Lymphs Abs: 1 10*3/uL (ref 0.7–4.0)
MCH: 29.2 pg (ref 26.0–34.0)
MCHC: 32.6 g/dL (ref 30.0–36.0)
MCV: 89.5 fL (ref 80.0–100.0)
Monocytes Absolute: 0.4 10*3/uL (ref 0.1–1.0)
Monocytes Relative: 10 %
Neutro Abs: 1.9 10*3/uL (ref 1.7–7.7)
Neutrophils Relative %: 56 %
Platelet Count: 55 10*3/uL — ABNORMAL LOW (ref 150–400)
RBC: 4.21 MIL/uL (ref 3.87–5.11)
RDW: 16.5 % — ABNORMAL HIGH (ref 11.5–15.5)
WBC Count: 3.4 10*3/uL — ABNORMAL LOW (ref 4.0–10.5)
nRBC: 0 % (ref 0.0–0.2)

## 2023-05-12 NOTE — Progress Notes (Signed)
 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, adult type, measuring about 12.5 cm   I reviewed her blood work and CT imaging with the patient and her husband She has no evidence of disease I recommend future follow-up with GYN surgeon only Thrombocytopenia (HCC) She has baseline splenomegaly and liver cirrhosis Giving her addition of prednisone did not improve her platelet count and she did not require transfusion support We will observe only Psoriatic arthritis (HCC) She has recent flare of severe skin changes consistent with psoriasis She is taking intermittent dosage of prednisone I will defer to her rheumatologist for management  No orders of the defined types were placed in this encounter.    Artis Delay, MD  INTERVAL HISTORY: she returns for surveillance followup With her last chemotherapy, she complained of excessive fatigue and significant flare of psoriasis I reviewed blood work, imaging study and discussed future follow-up  PHYSICAL EXAMINATION: ECOG PERFORMANCE STATUS: 1 - Symptomatic but completely ambulatory  Vitals:   05/12/23 0837  BP: 112/67  Pulse: 83  Resp: 16  Temp: 97.7 F (36.5 C)  SpO2: 98%   Filed Weights   05/12/23 0837  Weight: 242 lb 8.1 oz (110 kg)   Skin exam reveals significant psoriatic plaque throughout Relevant data reviewed during this visit included CBC, CMP and CT imaging from March 2025

## 2023-05-12 NOTE — Telephone Encounter (Signed)
 Called and told her per Dr. Bertis Ruddy, CT results normal as discussed in office. CT report has resulted. She verbalized understanding.

## 2023-05-12 NOTE — Assessment & Plan Note (Addendum)
 She has recent flare of severe skin changes consistent with psoriasis She is taking intermittent dosage of prednisone I will defer to her rheumatologist for management

## 2023-05-12 NOTE — Assessment & Plan Note (Addendum)
 She has baseline splenomegaly and liver cirrhosis Giving her addition of prednisone did not improve her platelet count and she did not require transfusion support We will observe only

## 2023-05-19 ENCOUNTER — Other Ambulatory Visit: Payer: Self-pay | Admitting: Genetic Counselor

## 2023-05-19 ENCOUNTER — Inpatient Hospital Stay

## 2023-05-19 ENCOUNTER — Inpatient Hospital Stay: Admitting: Genetic Counselor

## 2023-05-19 ENCOUNTER — Encounter: Payer: Self-pay | Admitting: Genetic Counselor

## 2023-05-19 DIAGNOSIS — C561 Malignant neoplasm of right ovary: Secondary | ICD-10-CM

## 2023-05-19 LAB — GENETIC SCREENING ORDER

## 2023-05-19 NOTE — Progress Notes (Signed)
 REFERRING PROVIDER: Artis Delay, MD [address] [place],  [place] [address]  PRIMARY PROVIDER:  Koren Shiver, DO  PRIMARY REASON FOR VISIT:  1. Granulosa cell carcinoma of ovary, right (HCC)      HISTORY OF PRESENT ILLNESS:   Ms. Masek, a 48 y.o. female, was seen for a [place] cancer genetics consultation at the request of Dr. Bertis Ruddy due to a personal history of an ovarian sex cord tumor.  Ms. Cavendish presents to clinic today to discuss the possibility of a hereditary predisposition to cancer, genetic testing, and to further clarify her future cancer risks, as well as potential cancer risks for family members.   In July 2024, at the age of 89, Ms. 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 The treatment plan included surgery and chemotherapy.   CANCER HISTORY:  Oncology History  Granulosa cell carcinoma of ovary, right (HCC)  09/06/2022 Imaging   CT Renal Stone: IMPRESSION: 1. No obstructive uropathy. No nephrolithiasis. No acute inflammatory process identified within the abdomen or pelvis. 2. Large predominantly cystic mass adjacent to and inseparable from the right ovary and the fundus of the uterus measuring approximately 14.2 x 16.2 x 17.3 cm (AP x TR x CC) which exhibits multiple intervening mural nodules/hyperattenuating septations. This is indeterminate but concerning for neoplastic process arising from the right ovary or uterus. OBGYN consultation and contrast-enhanced MRI pelvis is recommended. 3. Grossly stable, ill-defined hypoattenuating right hepatic lobe segment 7 lesion, better evaluated on the prior MRI from 2019. 4. Multiple indeterminate retroperitoneal lymph nodes with short axis less than 1 cm. 5. Multiple other nonacute observations, as described above.   09/13/2022 Tumor Marker   Inhibin B: 963.6   09/24/2022 Imaging   MRI abdomen/pelvis: IMPRESSION: 1. Complex multi septated cystic lesion with internal  enhancing solid components arising from the right adnexa measuring up to 19.2 cm, consistent with ovarian neoplasm. 2. Prominent retroperitoneal iliac side chain and pelvic sidewall lymph nodes are similar dating back to 2019 and are favored to be reactive. 3. Cirrhotic liver morphology with splenomegaly. 4. Similar size of the 5.2 cm hemangioma in the posterior right lobe of the liver. 5. Cholelithiasis without findings of acute cholecystitis. Gallbladder wall thickening is favored sequela of chronic hepatic disease. 6. Well-circumscribed T2 hyperintense homogeneously enhancing anterior intramural uterine lesion measuring up to 4.5 cm, favored to reflect a degenerating fibroid.   10/12/2022 Cancer Staging   Staging form: Ovary, Fallopian Tube, and Primary Peritoneal Carcinoma, AJCC 8th Edition - Pathologic stage from 10/12/2022: FIGO Stage IC2, calculated as Stage Unknown (pT1c2, pNX, cM0) - Signed by Clide Cliff, MD on 11/02/2022 Histopathologic type: Granulosa cell tumor, adult type Stage prefix: Initial diagnosis   10/12/2022 Surgery   Mini laparotomy for controlled cyst drainage, robotic assisted total laparoscopic hysterectomy, bilateral salpingo-oophorectomy, pelvic washings, infracolic omentectomy  Findings: On bimanual exam, mobile pelvic mass that arises to the level of the mid abdomen. On entry to abdomen, upper abdominal survey with nodular appearance of the liver, possibly consistent with cirrhosis. Otherwise smooth diaphragm, stomach and normal appearing omentum and bowel. Cystic and solid mass with smooth surface filling the pelvis. Controlled drainage of one portion of the cystic mass performed with yellow cyst fluid drained. On robotic portion of right salpingo-oophorectomy, incidental leakage of cyst fluid did occur. Remainder of mass drained and morcellated in a bag out of the supraumbilical incision with alexis retractor. 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 Normal  appearing left fallopian  tube and ovary. 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    10/12/2022 Pathology Results   A. 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 UTERUS, CERVIX, LEFT FALLOPIAN TUBE AND OVARY, RESECTION: - Uterus with benign leiomyoma, 5.4 cm - Benign proliferative endometrium - Benign unremarkable cervix - Benign unremarkable left fallopian tube and ovary - No evidence of malignancy  C. OMENTUM: - Portion of omentum, negative for tumor     COMMENT:  A.  Immunohistochemical stains show that the tumor cells are positive for calretinin and inhibin while they are negative for CK7, synaptophysin, chromogranin and PAX8, consistent with above interpretation.  Dr. Venetia Night reviewed the case and concurs with the diagnosis.   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, adult type Histologic Grade: Not applicable Ovarian Surface Involvement: Present Fallopian Tube Surface Involvement: Not identified Implants: Not applicable Lymphatic and/or Vascular Invasion: Not identified Other Tissue/ Organ Involvement: Not applicable Largest Extrapelvic Peritoneal Focus: Not applicable Peritoneal/Ascitic Fluid Involvement: Not applicable Chemotherapy Response Score (CRS): Not applicable, no known presurgical therapy Regional Lymph Nodes: Not applicable (no lymph nodes submitted or found) Distant  Metastasis:      Distant Site(s) Involved: Not applicable Pathologic Stage Classification (pTNM, AJCC 8th Edition): pT1c2, pN not assigned Ancillary Studies: Can be performed upon request Representative Tumor Block: A6    10/12/2022 Initial Diagnosis   Granulosa cell carcinoma of ovary, right (HCC)   11/18/2022 - 04/07/2023 Chemotherapy   Patient is on Treatment Plan : OVARIAN Carboplatin (AUC 6) + Paclitaxel (175) q21d X 6 Cycles     05/05/2023 Imaging   CT ABDOMEN PELVIS W CONTRAST Result Date: 05/12/2023 CLINICAL DATA:  Follow-up ovarian carcinoma. Assess treatment response. Cirrhosis. * Tracking Code: BO * EXAM: CT ABDOMEN AND PELVIS WITH CONTRAST TECHNIQUE: Multidetector CT imaging of the abdomen and pelvis was performed using the standard protocol following bolus administration of intravenous contrast. RADIATION DOSE REDUCTION: This exam was performed according to the departmental dose-optimization program which includes automated exposure control, adjustment of the mA and/or kV according to patient size and/or use of iterative reconstruction technique. CONTRAST:  OMNIPAQUE IOHEXOL 300 MG/ML  SOLN COMPARISON:  11/26/2022 FINDINGS: Lower Chest: No acute findings. Hepatobiliary: Hepatic cirrhosis again demonstrated. 5 cm benign hemangioma in the posterior right hepatic lobe is stable. No suspicious hepatic masses identified. Gallstones are again seen, without signs cholecystitis or biliary ductal dilatation Pancreas:  No mass or inflammatory changes. Spleen: Stable moderate splenomegaly. Adrenals/Urinary Tract: No suspicious masses identified. No evidence of ureteral calculi or hydronephrosis. Stomach/Bowel: No evidence of obstruction, inflammatory process or abnormal fluid collections. Normal appendix visualized. Vascular/Lymphatic: No pathologically enlarged lymph nodes. Shotty sub-centimeter lymph nodes in the upper abdomen and abdominal retroperitoneum show no significant change. No acute  vascular findings. Recanalization of paraumbilical veins and portosystemic venous collaterals in gastrohepatic ligament consistent with portal venous hypertension. Reproductive: Prior hysterectomy. Vaginal cuff is unremarkable in appearance. No evidence pelvic mass. No evidence of peritoneal thickening, omental caking, or ascites. Other:  None. Musculoskeletal:  No suspicious bone lesions identified. IMPRESSION: No evidence of recurrent or metastatic carcinoma within the abdomen or pelvis. 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 No evidence of hepatocellular carcinoma. Stable benign hepatic hemangioma. Cholelithiasis. No radiographic evidence of cholecystitis. Electronically Signed   By: Danae Orleans M.D.   On: 05/12/2023 09:28         RISK FACTORS:  Menarche was at age 33.  First live birth at age 26.  OCP use for approximately 1 years.  Ovaries intact: no.  Hysterectomy: yes.  Menopausal status: postmenopausal.  HRT use: 0 years. Colonoscopy: no; not examined. Mammogram within the last year: yes. Number of breast biopsies: 0. Up to date with pelvic exams: yes. Any excessive radiation exposure in the past: no  Past Medical History:  Diagnosis Date   Diabetes mellitus without complication (HCC)    Headache    Hypertension    PONV (postoperative nausea and vomiting)    Psoriatic arthritis (HCC)     Past Surgical History:  Procedure Laterality Date   BACK SURGERY     L-spine   KNEE SURGERY Right    LAPAROTOMY WITH STAGING N/A 10/12/2022   Procedure: MINI LAPAROTOMY FOR CYST DRAINAGE;  Surgeon: Clide Cliff, MD;  Location: [facility];  Service: Gynecology;  Laterality: N/A;   OMENTECTOMY N/A 10/12/2022   Procedure: OMENTECTOMY;  Surgeon: Clide Cliff, MD;  Location: [facility];  Service: Gynecology;  Laterality: N/A;   ROBOTIC ASSISTED SALPINGO OOPHERECTOMY N/A 10/12/2022   Procedure: XI ROBOTIC ASSISTED BILATERAL SALPINGO OOPHORECTOMY;  Surgeon: Clide Cliff, MD;  Location: [facility];  Service: Gynecology;  Laterality: N/A;   ROBOTIC ASSISTED TOTAL HYSTERECTOMY N/A 10/12/2022   Procedure: POSSIBLE XI ROBOTIC ASSISTED TOTAL HYSTERECTOMY;  Surgeon: Clide Cliff, MD;  Location: [facility];  Service: Gynecology;  Laterality: N/A;    Social History   Socioeconomic History   Marital status: Married    Spouse name: Not on file   Number of children: Not on file   Years of education: Not on file   Highest education level: Not on file  Occupational History   Not on file  Tobacco Use   Smoking status: Former    Types: Cigarettes   Smokeless tobacco: Never   Tobacco comments:    Socially in college  Vaping Use   Vaping status: Never Used  Substance and Sexual Activity   Alcohol use: Not Currently   Drug use: No   Sexual activity: Yes  Other Topics Concern   Not on file  Social History Narrative   Not on file   Social Drivers of Health   Financial Resource Strain: Not on file  Food Insecurity: Not on file  Transportation Needs: Not on file  Physical Activity: Not on file  Stress: Not on file  Social Connections: Unknown (07/02/2022)   Received [facility]   Social Network    Social Network: Not on file     FAMILY HISTORY:  We obtained a detailed, 4-generation family history.  Significant diagnoses are listed below: Family History  Adopted: Yes  Problem Relation Age of Onset   Healthy Daughter    Healthy Daughter      The patient has two daughters who are healthy and cancer free.  She is adopted and has no information about her biological family other than her father was of [place] ancestry.  GENETIC COUNSELING ASSESSMENT: Ms. Kruckenberg is a 48 y.o. female with a personal history of sex cord tumor  ovarian cancer which is somewhat suggestive of a DICER1 or other hereditary cancer syndrome and predisposition to cancer given her young age of onset and the pathology of her ovarian cancer. We, therefore, discussed and  recommended the following at today's visit.   DISCUSSION: We discussed that, in general, most cancer is not inherited in families, but instead is sporadic or familial. Sporadic cancers occur by chance and typically happen at older ages (>50 years) as this type of cancer is caused by genetic changes acquired during an individual's lifetime. Some families have more cancers than would be expected by chance; however, the ages or types of cancer are not consistent with a known genetic mutation or known genetic mutations have been ruled out. This type of familial cancer is thought to be due to a combination of multiple genetic, environmental, hormonal, and lifestyle factors. While this combination of factors likely increases the risk of cancer, the exact source of this risk is not currently identifiable or testable.  We discussed that up to 20% of ovarian cancer is hereditary, with most cases of the type of ovarian cancer Ms. Pack has (ovarian sex cord tumor) being associated with DICER1 mutations.  There are other genes that can be associated with hereditary ovarian cancer syndromes, and more specifically sex cord tumors.  These include STK11, that also have an increased risk for sex cord tumors, as well as BRCA1, BRCA2, BRIP1, RAD51C, RAD51D, and Lynch syndrome for the more common epithelial ovarian cancer diagnosis.  We discussed that testing is beneficial for several reasons including knowing how to follow individuals after completing their treatment and understanding if other family members could be at risk for cancer and allow them to undergo genetic testing.   We reviewed the characteristics, features and inheritance patterns of hereditary cancer syndromes. We also discussed genetic testing, including the appropriate family members to test, the process of testing, insurance coverage and turn-around-time for results. We discussed the implications of a negative, positive, carrier and/or variant of uncertain  significant result. Ms. Weyland  was offered a common hereditary cancer panel (36+ genes) and an expanded pan-cancer panel (70+ genes). Ms. Pennywell was informed of the benefits and limitations of each panel, including that expanded pan-cancer panels contain genes that do not have clear management guidelines at this point in time.  We also discussed that as the number of genes included on a panel increases, the chances of variants of uncertain significance increases. Ms. Brickner decided to pursue genetic testing for the CancerNext-Expanded+RNAinsight gene panel.   The CancerNext-Expanded gene panel offered by [facility] and includes sequencing, rearrangement, and RNA analysis for the following 76 genes: AIP, ALK, APC, ATM, AXIN2, BAP1, BARD1, BMPR1A, BRCA1, BRCA2, BRIP1, CDC73, CDH1, CDK4, CDKN1B, CDKN2A, CEBPA, CHEK2, CTNNA1, DDX41, DICER1, ETV6, FH, FLCN, GATA2, LZTR1, MAX, MBD4, MEN1, MET, MLH1, MSH2, MSH3, MSH6, MUTYH, NF1, NF2, NTHL1, PALB2, PHOX2B, PMS2, POT1, PRKAR1A, PTCH1, PTEN, RAD51C, RAD51D, RB1, RET, RUNX1, SDHA, SDHAF2, SDHB, SDHC, SDHD, SMAD4, SMARCA4, SMARCB1, SMARCE1, STK11, SUFU, TMEM127, TP53, TSC1, TSC2, VHL, and WT1 (sequencing and deletion/duplication); EGFR, HOXB13, KIT, MITF, PDGFRA, POLD1, and POLE (sequencing only); EPCAM and GREM1 (deletion/duplication only).    Based on Ms. Benham personal history of cancer, she meets medical criteria for genetic testing. Despite that she meets criteria, she may still have an out of pocket cost. We discussed that if her out of pocket cost for testing is over $100, the laboratory will call and confirm whether she wants to proceed with testing.  If  the out of pocket cost of testing is less than $100 she will be billed by the genetic testing laboratory.   We discussed that some people do not want to undergo genetic testing due to fear of genetic discrimination.  The Genetic Information Nondiscrimination Act (GINA) was signed into federal law in  2008. GINA prohibits health insurers and most employers from discriminating against individuals based on genetic information (including the results of genetic tests and family history information). According to GINA, health insurance companies cannot consider genetic information to be a preexisting condition, nor can they use it to make decisions regarding coverage or rates. GINA also makes it illegal for most employers to use genetic information in making decisions about hiring, firing, promotion, or terms of employment. It is important to note that GINA does not offer protections for life insurance, disability insurance, or long-term care insurance. GINA does not apply to those in the [employer], those who work for companies with less than 15 employees, and new life insurance or long-term disability insurance policies.  Health status due to a cancer diagnosis is not protected under GINA. More information about GINA can be found by visiting [email or web address].  PLAN: After considering the risks, benefits, and limitations, Ms. Eyerly provided informed consent to pursue genetic testing and the blood sample was sent to [facility] for analysis of the CancerNext-Expanded+RNAinsight. Results should be available within approximately 2-3 weeks' time, at which point they will be disclosed by telephone to Ms. Rossetti, as will any additional recommendations warranted by these results. Ms. Perris will receive a summary of her genetic counseling visit and a copy of her results once available. This information will also be available in Epic.   Lastly, we encouraged Ms. Finks to remain in contact with cancer genetics annually so that we can continuously update the family history and inform her of any changes in cancer genetics and testing that may be of benefit for this family.   Ms. Cala questions were answered to her satisfaction today. Our contact information was provided should additional questions  or concerns arise. Thank you for the referral and allowing [place] to share in the care of your patient.   Olia Hinderliter P. Lowell Guitar, MS, CGC Licensed, Patent attorney [email or web address] phone: [phone]  60 minutes were spent on the date of the encounter in service to the patient including preparation, face-to-face consultation, documentation and care coordination.  The patient was seen alone.  Drs. Meliton Rattan, and/or [place] were available for questions, if needed..    _______________________________________________________________________ For Office Staff:  Number of people involved in session: 1 Was an Intern/ student involved with case: no

## 2023-05-25 DIAGNOSIS — L409 Psoriasis, unspecified: Secondary | ICD-10-CM | POA: Diagnosis not present

## 2023-05-25 DIAGNOSIS — E669 Obesity, unspecified: Secondary | ICD-10-CM | POA: Diagnosis not present

## 2023-05-25 DIAGNOSIS — L4050 Arthropathic psoriasis, unspecified: Secondary | ICD-10-CM | POA: Diagnosis not present

## 2023-05-25 DIAGNOSIS — R5383 Other fatigue: Secondary | ICD-10-CM | POA: Diagnosis not present

## 2023-05-25 DIAGNOSIS — Z6836 Body mass index (BMI) 36.0-36.9, adult: Secondary | ICD-10-CM | POA: Diagnosis not present

## 2023-05-29 ENCOUNTER — Other Ambulatory Visit (HOSPITAL_BASED_OUTPATIENT_CLINIC_OR_DEPARTMENT_OTHER): Payer: Self-pay

## 2023-06-02 ENCOUNTER — Encounter (HOSPITAL_BASED_OUTPATIENT_CLINIC_OR_DEPARTMENT_OTHER): Payer: Self-pay

## 2023-06-02 ENCOUNTER — Other Ambulatory Visit (HOSPITAL_BASED_OUTPATIENT_CLINIC_OR_DEPARTMENT_OTHER): Payer: Self-pay

## 2023-06-02 DIAGNOSIS — L400 Psoriasis vulgaris: Secondary | ICD-10-CM | POA: Diagnosis not present

## 2023-06-02 DIAGNOSIS — E1169 Type 2 diabetes mellitus with other specified complication: Secondary | ICD-10-CM | POA: Diagnosis not present

## 2023-06-02 DIAGNOSIS — I10 Essential (primary) hypertension: Secondary | ICD-10-CM | POA: Diagnosis not present

## 2023-06-02 MED ORDER — CLOBETASOL PROPIONATE 0.05 % EX CREA
TOPICAL_CREAM | CUTANEOUS | 1 refills | Status: AC
Start: 2023-06-02 — End: ?
  Filled 2023-06-02: qty 30, 14d supply, fill #0
  Filled 2023-09-15 – 2024-01-05 (×2): qty 30, 14d supply, fill #1

## 2023-06-02 MED ORDER — METFORMIN HCL 1000 MG PO TABS
1000.0000 mg | ORAL_TABLET | Freq: Two times a day (BID) | ORAL | 3 refills | Status: AC
  Filled 2023-06-02: qty 180, 90d supply, fill #0
  Filled 2023-09-05 – 2023-09-26 (×3): qty 180, 90d supply, fill #1
  Filled 2023-12-22: qty 180, 90d supply, fill #2

## 2023-06-02 MED ORDER — SERTRALINE HCL 50 MG PO TABS
50.0000 mg | ORAL_TABLET | Freq: Every day | ORAL | 3 refills | Status: AC
  Filled 2023-06-02: qty 90, 90d supply, fill #0
  Filled 2023-12-22: qty 90, 90d supply, fill #1
  Filled 2024-03-16: qty 90, 90d supply, fill #2

## 2023-06-02 MED ORDER — OZEMPIC (2 MG/DOSE) 8 MG/3ML ~~LOC~~ SOPN
2.0000 mg | PEN_INJECTOR | SUBCUTANEOUS | 1 refills | Status: DC
Start: 2023-06-02 — End: 2023-11-15
  Filled 2023-06-02: qty 9, 84d supply, fill #0
  Filled 2023-08-22: qty 9, 84d supply, fill #1

## 2023-06-02 MED ORDER — JARDIANCE 10 MG PO TABS
10.0000 mg | ORAL_TABLET | Freq: Every day | ORAL | 3 refills | Status: DC
  Filled 2023-06-02: qty 90, 90d supply, fill #0

## 2023-06-03 ENCOUNTER — Encounter: Payer: Self-pay | Admitting: Genetic Counselor

## 2023-06-03 ENCOUNTER — Telehealth: Payer: Self-pay | Admitting: Genetic Counselor

## 2023-06-03 DIAGNOSIS — Z1379 Encounter for other screening for genetic and chromosomal anomalies: Secondary | ICD-10-CM | POA: Insufficient documentation

## 2023-06-03 NOTE — Telephone Encounter (Signed)
 LM on VM that results are back and to please call.  Left CB instructions.

## 2023-06-06 ENCOUNTER — Encounter: Payer: Self-pay | Admitting: Genetic Counselor

## 2023-06-06 NOTE — Telephone Encounter (Signed)
Left 2nd message that results are back and to please call.  Left CB instructions.

## 2023-06-07 ENCOUNTER — Other Ambulatory Visit (HOSPITAL_BASED_OUTPATIENT_CLINIC_OR_DEPARTMENT_OTHER): Payer: Self-pay

## 2023-06-07 MED ORDER — JARDIANCE 25 MG PO TABS
25.0000 mg | ORAL_TABLET | Freq: Every day | ORAL | 1 refills | Status: AC
  Filled 2023-06-07 – 2023-07-08 (×2): qty 90, 90d supply, fill #0
  Filled 2024-01-05: qty 90, 90d supply, fill #1
  Filled 2024-01-09: qty 90, 90d supply, fill #0
  Filled 2024-01-09: qty 90, 90d supply, fill #1

## 2023-06-07 NOTE — Telephone Encounter (Signed)
 Left 3rd message on VM both on mobile and home.  Left CB instructions.  Noticed that she saw her MyChart note as well.

## 2023-06-09 ENCOUNTER — Telehealth: Payer: Self-pay | Admitting: Pharmacist

## 2023-06-09 ENCOUNTER — Other Ambulatory Visit: Payer: Self-pay

## 2023-06-09 ENCOUNTER — Other Ambulatory Visit (HOSPITAL_COMMUNITY): Payer: Self-pay

## 2023-06-09 ENCOUNTER — Other Ambulatory Visit (HOSPITAL_BASED_OUTPATIENT_CLINIC_OR_DEPARTMENT_OTHER): Payer: Self-pay

## 2023-06-09 ENCOUNTER — Other Ambulatory Visit: Payer: Self-pay | Admitting: Pharmacy Technician

## 2023-06-09 MED ORDER — TALTZ 80 MG/ML ~~LOC~~ SOAJ
SUBCUTANEOUS | 6 refills | Status: DC
Start: 2023-05-25 — End: 2023-06-17

## 2023-06-09 MED ORDER — TALTZ 80 MG/ML ~~LOC~~ SOAJ: SUBCUTANEOUS | 0 refills | Status: DC

## 2023-06-09 NOTE — Progress Notes (Signed)
 Pharmacy Patient Advocate Encounter  Insurance verification completed.   The patient is insured through [facility]   Ran test claim for [place]. Co-pay is $0. Patient has copay card.   This test claim was processed through [facility]- copay amounts may vary at other pharmacies due to pharmacy/plan contracts, or as the patient moves through the different stages of their insurance plan.

## 2023-06-09 NOTE — Telephone Encounter (Signed)
 Called patient to schedule an appointment for the [facility] [employer] [facility]. I was unable to reach the patient so I left a HIPAA-compliant message requesting that the patient return my call.   Butch Penny, PharmD, Patsy [place], CPP Clinical Pharmacist [facility] [phone]

## 2023-06-13 ENCOUNTER — Other Ambulatory Visit (HOSPITAL_COMMUNITY): Payer: Self-pay

## 2023-06-13 ENCOUNTER — Other Ambulatory Visit: Payer: Self-pay

## 2023-06-13 ENCOUNTER — Encounter: Payer: Self-pay | Admitting: Genetic Counselor

## 2023-06-13 ENCOUNTER — Ambulatory Visit: Payer: Self-pay | Admitting: Genetic Counselor

## 2023-06-13 DIAGNOSIS — Z1379 Encounter for other screening for genetic and chromosomal anomalies: Secondary | ICD-10-CM

## 2023-06-13 DIAGNOSIS — C561 Malignant neoplasm of right ovary: Secondary | ICD-10-CM

## 2023-06-13 NOTE — Telephone Encounter (Signed)
 Left message on VM that I was sorry I kept missing her and that her results were back.  I let her know that I saw she received my MyChart message, and will message her with the results, upload a copy of the results to her chart and write a note describing her results.

## 2023-06-13 NOTE — Progress Notes (Signed)
 HPI:  Ms. Kearse was previously seen in the [facility] due to a personal history of cancer and concerns regarding a hereditary predisposition to cancer. Please refer to our prior cancer genetics clinic note for more information regarding our discussion, assessment and recommendations, at the time. Ms. Mcquitty recent genetic test results were disclosed to her, as were recommendations warranted by these results. These results and recommendations are discussed in more detail below.  CANCER HISTORY:  Oncology History  Granulosa cell carcinoma of ovary, right (HCC)  09/06/2022 Imaging   CT Renal Stone: IMPRESSION: 1. No obstructive uropathy. No nephrolithiasis. No acute inflammatory process identified within the abdomen or pelvis. 2. Large predominantly cystic mass adjacent to and inseparable from the right ovary and the fundus of the uterus measuring approximately 14.2 x 16.2 x 17.3 cm (AP x TR x CC) which exhibits multiple intervening mural nodules/hyperattenuating septations. This is indeterminate but concerning for neoplastic process arising from the right ovary or uterus. OBGYN consultation and contrast-enhanced MRI pelvis is recommended. 3. Grossly stable, ill-defined hypoattenuating right hepatic lobe segment 7 lesion, better evaluated on the prior MRI from 2019. 4. Multiple indeterminate retroperitoneal lymph nodes with short axis less than 1 cm. 5. Multiple other nonacute observations, as described above.   09/13/2022 Tumor Marker   Inhibin B: 963.6   09/24/2022 Imaging   MRI abdomen/pelvis: IMPRESSION: 1. Complex multi septated cystic lesion with internal enhancing solid components arising from the right adnexa measuring up to 19.2 cm, consistent with ovarian neoplasm. 2. Prominent retroperitoneal iliac side chain and pelvic sidewall lymph nodes are similar dating back to 2019 and are favored to be reactive. 3. Cirrhotic liver morphology with splenomegaly. 4.  Similar size of the 5.2 cm hemangioma in the posterior right lobe of the liver. 5. Cholelithiasis without findings of acute cholecystitis. Gallbladder wall thickening is favored sequela of chronic hepatic disease. 6. Well-circumscribed T2 hyperintense homogeneously enhancing anterior intramural uterine lesion measuring up to 4.5 cm, favored to reflect a degenerating fibroid.   10/12/2022 Cancer Staging   Staging form: Ovary, Fallopian Tube, and Primary Peritoneal Carcinoma, AJCC 8th Edition - Pathologic stage from 10/12/2022: FIGO Stage IC2, calculated as Stage Unknown (pT1c2, pNX, cM0) - Signed by Derrel Flies, MD on 11/02/2022 Histopathologic type: Granulosa cell tumor, adult type Stage prefix: Initial diagnosis   10/12/2022 Surgery   Mini laparotomy for controlled cyst drainage, robotic assisted total laparoscopic hysterectomy, bilateral salpingo-oophorectomy, pelvic washings, infracolic omentectomy  Findings: On bimanual exam, mobile pelvic mass that arises to the level of the mid abdomen. On entry to abdomen, upper abdominal survey with nodular appearance of the liver, possibly consistent with cirrhosis. Otherwise smooth diaphragm, stomach and normal appearing omentum and bowel. Cystic and solid mass with smooth surface filling the pelvis. Controlled drainage of one portion of the cystic mass performed with yellow cyst fluid drained. On robotic portion of right salpingo-oophorectomy, incidental leakage of cyst fluid did occur. Remainder of mass drained and morcellated in a bag out of the supraumbilical incision with alexis retractor. 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 Normal appearing left fallopian tube and ovary. 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    10/12/2022 Pathology Results   A. 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 UTERUS, CERVIX, LEFT FALLOPIAN TUBE AND OVARY, RESECTION: - Uterus with benign leiomyoma, 5.4 cm - Benign proliferative endometrium - Benign unremarkable cervix - Benign unremarkable left fallopian tube and ovary - No evidence of malignancy  C. OMENTUM: - Portion of omentum, negative for tumor     COMMENT:  A.  Immunohistochemical stains show that the tumor cells are positive for calretinin and inhibin while they are negative for CK7, synaptophysin, chromogranin and PAX8, consistent with above interpretation.  Dr. Kavin Parsley reviewed the case and concurs with the diagnosis.   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, adult type Histologic Grade: Not applicable Ovarian Surface Involvement: Present Fallopian Tube Surface Involvement: Not identified Implants: Not applicable Lymphatic and/or Vascular Invasion: Not identified Other Tissue/ Organ Involvement: Not applicable Largest Extrapelvic Peritoneal Focus: Not applicable Peritoneal/Ascitic Fluid Involvement: Not applicable Chemotherapy Response Score (CRS): Not applicable, no known presurgical therapy Regional Lymph Nodes: Not applicable (no lymph nodes submitted or found) Distant Metastasis:      Distant Site(s) Involved: Not applicable Pathologic Stage Classification (pTNM, AJCC 8th Edition): pT1c2, pN not assigned Ancillary Studies: Can be performed upon request Representative Tumor Block: A6    10/12/2022 Initial Diagnosis   Granulosa cell carcinoma of ovary, right (HCC)   11/18/2022 -  04/07/2023 Chemotherapy   Patient is on Treatment Plan : OVARIAN Carboplatin  (AUC 6) + Paclitaxel  (175) q21d X 6 Cycles     05/05/2023 Imaging   CT ABDOMEN PELVIS W CONTRAST Result Date: 05/12/2023 CLINICAL DATA:  Follow-up ovarian carcinoma. Assess treatment response. Cirrhosis. * Tracking Code: BO * EXAM: CT ABDOMEN AND PELVIS WITH CONTRAST TECHNIQUE: Multidetector CT imaging of the abdomen and pelvis was performed using the standard protocol following bolus administration of intravenous contrast. RADIATION DOSE REDUCTION: This exam was performed according to the departmental dose-optimization program which includes automated exposure control, adjustment of the mA and/or kV according to patient size and/or use of iterative reconstruction technique. CONTRAST:  OMNIPAQUE  IOHEXOL  300 MG/ML  SOLN COMPARISON:  11/26/2022 FINDINGS: Lower Chest: No acute findings. Hepatobiliary: Hepatic cirrhosis again demonstrated. 5 cm benign hemangioma in the posterior right hepatic lobe is stable. No suspicious hepatic masses identified. Gallstones are again seen, without signs cholecystitis or biliary ductal dilatation Pancreas:  No mass or inflammatory changes. Spleen: Stable moderate splenomegaly. Adrenals/Urinary Tract: No suspicious masses identified. No evidence of ureteral calculi or hydronephrosis. Stomach/Bowel: No evidence of obstruction, inflammatory process or abnormal fluid collections. Normal appendix visualized. Vascular/Lymphatic: No pathologically enlarged lymph nodes. Shotty sub-centimeter lymph nodes in the upper abdomen and abdominal retroperitoneum show no significant change. No acute vascular findings. Recanalization of paraumbilical veins and portosystemic venous collaterals in gastrohepatic ligament consistent with portal venous hypertension. Reproductive: Prior hysterectomy. Vaginal cuff is unremarkable in appearance. No evidence pelvic mass. No evidence of peritoneal thickening, omental caking, or  ascites. Other:  None. Musculoskeletal:  No suspicious bone lesions identified. IMPRESSION: No evidence of recurrent or metastatic carcinoma within the abdomen or pelvis. 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 No evidence of hepatocellular carcinoma. Stable benign hepatic hemangioma. Cholelithiasis. No radiographic evidence of cholecystitis. Electronically Signed   By: Marlyce Sine M.D.   On: 05/12/2023 09:28      06/01/2023 Genetic Testing   Negative  genetic testing on the CancerNext-Expanded+RNAinsight panel. The report date is June 01, 2023.  The CancerNext-Expanded gene panel offered by [facility] and includes sequencing, rearrangement, and RNA analysis for the following 76 genes: AIP, ALK, APC, ATM, AXIN2, BAP1, BARD1, BMPR1A, BRCA1, BRCA2, BRIP1, CDC73, CDH1, CDK4, CDKN1B, CDKN2A, CEBPA, CHEK2, CTNNA1, DDX41, DICER1, ETV6, FH, FLCN, GATA2, LZTR1, MAX, MBD4, MEN1, MET, MLH1, MSH2, MSH3, MSH6, MUTYH, NF1, NF2, NTHL1, PALB2, PHOX2B, PMS2, POT1, PRKAR1A, PTCH1, PTEN, RAD51C, RAD51D, RB1, RET, RUNX1, SDHA, SDHAF2, SDHB, SDHC, SDHD, SMAD4, SMARCA4, SMARCB1, SMARCE1, STK11, SUFU, TMEM127, TP53, TSC1, TSC2, VHL, and WT1 (sequencing and deletion/duplication); EGFR, HOXB13, KIT, MITF, PDGFRA, POLD1, and POLE (sequencing only); EPCAM and GREM1 (deletion/duplication only).      FAMILY HISTORY:  We obtained a detailed, 4-generation family history.  Significant diagnoses are listed below: Family History  Adopted: Yes  Problem Relation Age of Onset   Healthy Daughter    Healthy Daughter        The patient has two daughters who are healthy and cancer free.  She is adopted and has no information about her biological family other than her father was of [place] ancestry.  GENETIC TEST RESULTS: Genetic testing reported out on June 01, 2023 through the CancerNext-Expanded+RNAinsight cancer panel found no pathogenic mutations. The CancerNext-Expanded gene panel offered by [facility] and includes sequencing, rearrangement, and RNA analysis for the following 76 genes: AIP, ALK, APC, ATM, AXIN2, BAP1, BARD1, BMPR1A, BRCA1, BRCA2, BRIP1, CDC73, CDH1, CDK4, CDKN1B, CDKN2A, CEBPA, CHEK2, CTNNA1, DDX41, DICER1, ETV6, FH, FLCN, GATA2, LZTR1, MAX, MBD4, MEN1, MET, MLH1, MSH2, MSH3, MSH6, MUTYH, NF1, NF2, NTHL1, PALB2, PHOX2B, PMS2, POT1, PRKAR1A, PTCH1, PTEN, RAD51C, RAD51D, RB1, RET, RUNX1, SDHA, SDHAF2, SDHB, SDHC, SDHD, SMAD4, SMARCA4, SMARCB1, SMARCE1, STK11, SUFU, TMEM127, TP53, TSC1, TSC2, VHL, and WT1 (sequencing and deletion/duplication); EGFR, HOXB13, KIT, MITF, PDGFRA, POLD1, and POLE (sequencing only); EPCAM and GREM1 (deletion/duplication only). The test report has been scanned into EPIC and is located under the Molecular Pathology section of the Results Review tab.  A portion of the result report is included below for reference.     We discussed with Ms. Kincannon that because current genetic testing is not perfect, it is possible there may be a gene mutation in one of these genes that current testing cannot detect, but that chance is small.  We also discussed, that there could be another gene that has not yet been discovered, or that we have not yet tested, that is responsible for the cancer diagnoses in the family. It is also possible there is a hereditary cause for the cancer in the family that Ms. Landers did not inherit and therefore was not identified in her testing.  Therefore, it is important to remain in touch with cancer genetics in the future so that we can continue to offer Ms. Kearn the most up to date genetic testing.   ADDITIONAL GENETIC TESTING: We discussed with Ms. Gerdeman that her genetic testing was fairly extensive.  If there are genes identified to increase cancer risk that can be analyzed in the future, we would be happy to discuss and coordinate this testing at that time.    CANCER SCREENING RECOMMENDATIONS: Ms. Supak test result is considered  negative (normal).  This means that we have not identified a hereditary cause for her personal history of cancer at this time. Most cancers happen by chance and this negative test suggests that her personal history of cancer may fall into this category.  Possible reasons for Ms. Hewett's negative genetic test include:  1. There may be a gene mutation in one of these genes that current testing methods cannot detect but that chance is small.  2. There could be another gene that has not yet been discovered, or that we have not yet tested, that is responsible for the cancer diagnoses in the family.  3.  There may be no hereditary risk for cancer in the family. The cancers in Ms. Auerbach and/or her family may be sporadic/familial or due to other genetic and environmental factors. 4. It is also possible there is a hereditary cause for the cancer in the family that Ms. Sheriff did not inherit.  Therefore, it is recommended she continue to follow the cancer management and screening guidelines provided by her oncology and primary healthcare provider. An individual's cancer risk and medical management are not determined by genetic test results alone. Overall cancer risk assessment incorporates additional factors, including personal medical history, family history, and any available genetic information that may result in a personalized plan for cancer prevention and surveillance  RECOMMENDATIONS FOR FAMILY MEMBERS:   Since she did not inherit a identifiable mutation in a cancer predisposition gene included on this panel, her children could not have inherited a known mutation from her in one of these genes. Individuals in this family might be at some increased risk of developing cancer, over the general population risk, simply due to the family history of cancer.  We recommended women in this family have a yearly mammogram beginning at age 46, or 13 years younger than the earliest onset of cancer, an annual  clinical breast exam, and perform monthly breast self-exams. Women in this family should also have a gynecological exam as recommended by their primary provider. All family members should be referred for colonoscopy starting at age 40, or 66 years younger than the earliest onset of cancer.  FOLLOW-UP: Lastly, we discussed with Ms. Wolters that cancer genetics is a rapidly advancing field and it is possible that new genetic tests will be appropriate for her and/or her family members in the future. We encouraged her to remain in contact with cancer genetics on an annual basis so we can update her personal and family histories and let her know of advances in cancer genetics that may benefit this family.   Our contact number was provided. Ms. Hilscher questions were answered to her satisfaction, and she knows she is welcome to call us  at anytime with additional questions or concerns.   Marijo Shove, MS, [facility] Licensed, Certified Genetic Counselor [email or web address]

## 2023-06-17 ENCOUNTER — Ambulatory Visit: Attending: Internal Medicine | Admitting: Pharmacist

## 2023-06-17 ENCOUNTER — Other Ambulatory Visit (HOSPITAL_COMMUNITY): Payer: Self-pay

## 2023-06-17 ENCOUNTER — Other Ambulatory Visit (HOSPITAL_BASED_OUTPATIENT_CLINIC_OR_DEPARTMENT_OTHER): Payer: Self-pay

## 2023-06-17 ENCOUNTER — Encounter (HOSPITAL_COMMUNITY): Payer: Self-pay

## 2023-06-17 ENCOUNTER — Other Ambulatory Visit: Payer: Self-pay

## 2023-06-17 ENCOUNTER — Other Ambulatory Visit: Payer: Self-pay | Admitting: Pharmacist

## 2023-06-17 DIAGNOSIS — Z79899 Other long term (current) drug therapy: Secondary | ICD-10-CM

## 2023-06-17 MED ORDER — TALTZ 80 MG/ML ~~LOC~~ SOAJ
SUBCUTANEOUS | 0 refills | Status: AC
  Filled 2023-06-17: qty 6, fill #0
  Filled 2023-06-22: qty 2, 28d supply, fill #0
  Filled 2023-09-14 – 2024-03-09 (×4): qty 2, 28d supply, fill #1

## 2023-06-17 MED ORDER — TALTZ 80 MG/ML ~~LOC~~ SOAJ
SUBCUTANEOUS | 6 refills | Status: AC
  Filled 2023-06-17 – 2023-07-28 (×2): qty 1, fill #0
  Filled 2023-08-24: qty 1, 28d supply, fill #0
  Filled 2023-10-25: qty 1, 28d supply, fill #1
  Filled 2023-11-15: qty 1, 28d supply, fill #2
  Filled 2023-12-12 – 2023-12-16 (×3): qty 1, 28d supply, fill #3
  Filled 2024-01-11: qty 1, 28d supply, fill #4
  Filled 2024-02-06: qty 1, 28d supply, fill #5
  Filled 2024-03-09: qty 1, 28d supply, fill #6

## 2023-06-17 NOTE — Progress Notes (Signed)
   S: Patient presents today for review of their specialty medication therapy.  Patient is restarting Taltz  for psoriatic arthritis. Patient is managed by Dr. Lalama for this.   Adherence: denies any issues - has taken in the past.  Efficacy: reports that it worked well for her in the past. Has already completed loading dose with samples from her Specialist. Can already tell an improvement in symptoms.  Dosing:  Psoriatic arthritis: SubQ: 160 mg once, followed by 80 mg every 4 weeks; may administer alone or in combination with conventional disease-modifying antirheumatic drugs (eg, methotrexate).   Dose adjustments: Renal: no dose adjustments (has not been studied) Hepatic: no dose adjustments (has not been studied)  Screening: TB test: completed per patient  Monitoring: S/sx of infection: denies CBC: monitored by [facility] rheumatology S/sx of hypersensitivity: denies   O:     Lab Results  Component Value Date   WBC 3.4 (L) 05/12/2023   HGB 12.3 05/12/2023   HCT 37.7 05/12/2023   MCV 89.5 05/12/2023   PLT 55 (L) 05/12/2023      Chemistry      Component Value Date/Time   NA 139 05/12/2023 0823   K 2.9 (L) 05/12/2023 0823   CL 100 05/12/2023 0823   CO2 31 05/12/2023 0823   BUN 11 05/12/2023 0823   CREATININE 0.47 05/12/2023 0823      Component Value Date/Time   CALCIUM  9.5 05/12/2023 0823   ALKPHOS 39 05/12/2023 0823   AST 54 (H) 05/12/2023 0823   ALT 43 05/12/2023 0823   BILITOT 1.6 (H) 05/12/2023 0823       A/P: 1. Medication review: Patient is restarting Taltz  for psoriatic arthritis. Reviewed the medication with the patient, including the following: Taltz  is a monoclonal antibody used in the treatment of ankylosing spondylitis, plaque psoriasis, and psoriatic arthritis. Administration instructions were provided: Subcutaneous: Allow to reach room temperature prior to injection (30 minutes). Do not shake. Inject full amount into the upper arms, thighs or  any quadrant of the abdomen; administer each injection at a different anatomic location than a previous injection and avoid areas where the skin is tender, bruised, erythematous, indurated, or affected by psoriasis. Administration in the upper, outer arm may be performed by a caregiver or health care provider. Ixekizumab  is intended for use under the guidance and supervision of a physician; may be self-injected by the patient following proper training in SubQ injection technique. Possible adverse effects include neutropenia, increased risk of infection, local injection site reaction, and antibody development (loss of efficacy of drug). May increase risk of Crohn disease or ulcerative colitis. Live vaccinations should be avoided. No recommendations for any changes.   Marene Shape, PharmD, Becky Bowels, CPP Clinical Pharmacist [facility] [phone]

## 2023-06-17 NOTE — Progress Notes (Signed)
 Please see OV for additional documentation.   Marene Shape, PharmD, Becky Bowels, CPP Clinical Pharmacist [facility] [phone]

## 2023-06-22 ENCOUNTER — Other Ambulatory Visit: Payer: Self-pay

## 2023-06-22 NOTE — Progress Notes (Signed)
 Specialty Pharmacy Initial Fill Coordination Note  Kirsten Rodriguez is a 48 y.o. female contacted today regarding initial fill of specialty medication(s) Ixekizumab  (Taltz )   Patient requested [place] [facility] at [facility] at [place] date: 06/23/23   Medication will be filled on 5/1.   Patient is aware of $0 copayment.

## 2023-06-23 ENCOUNTER — Other Ambulatory Visit: Payer: Self-pay

## 2023-07-08 ENCOUNTER — Other Ambulatory Visit (HOSPITAL_BASED_OUTPATIENT_CLINIC_OR_DEPARTMENT_OTHER): Payer: Self-pay

## 2023-07-08 ENCOUNTER — Other Ambulatory Visit: Payer: Self-pay

## 2023-07-09 ENCOUNTER — Other Ambulatory Visit (HOSPITAL_BASED_OUTPATIENT_CLINIC_OR_DEPARTMENT_OTHER): Payer: Self-pay

## 2023-07-11 ENCOUNTER — Other Ambulatory Visit (HOSPITAL_BASED_OUTPATIENT_CLINIC_OR_DEPARTMENT_OTHER): Payer: Self-pay

## 2023-07-11 MED ORDER — ROSUVASTATIN CALCIUM 5 MG PO TABS
5.0000 mg | ORAL_TABLET | ORAL | 1 refills | Status: DC
  Filled 2023-07-11: qty 36, 84d supply, fill #0
  Filled 2023-09-05 – 2023-09-16 (×3): qty 36, 84d supply, fill #1

## 2023-07-13 ENCOUNTER — Other Ambulatory Visit (HOSPITAL_COMMUNITY): Payer: Self-pay

## 2023-07-15 ENCOUNTER — Other Ambulatory Visit (HOSPITAL_COMMUNITY): Payer: Self-pay

## 2023-07-29 ENCOUNTER — Other Ambulatory Visit (HOSPITAL_BASED_OUTPATIENT_CLINIC_OR_DEPARTMENT_OTHER): Payer: Self-pay

## 2023-07-29 ENCOUNTER — Other Ambulatory Visit: Payer: Self-pay

## 2023-08-23 ENCOUNTER — Other Ambulatory Visit: Payer: Self-pay

## 2023-08-23 ENCOUNTER — Other Ambulatory Visit (HOSPITAL_COMMUNITY): Payer: Self-pay

## 2023-08-24 ENCOUNTER — Other Ambulatory Visit: Payer: Self-pay

## 2023-08-24 ENCOUNTER — Other Ambulatory Visit (HOSPITAL_COMMUNITY): Payer: Self-pay

## 2023-08-24 NOTE — Progress Notes (Signed)
 Specialty Pharmacy Refill Coordination Note  Spoke with Elysia Grand is a 48 y.o. female contacted today regarding refills of specialty medication(s) Ixekizumab  (Taltz )  Injection date: 08/25/23    Patient requested: [facility] at [facility] at [place] date: 08/25/23  Medication will be filled on 08/25/23.

## 2023-08-25 ENCOUNTER — Other Ambulatory Visit (HOSPITAL_COMMUNITY): Payer: Self-pay

## 2023-08-25 ENCOUNTER — Other Ambulatory Visit: Payer: Self-pay

## 2023-08-25 DIAGNOSIS — E669 Obesity, unspecified: Secondary | ICD-10-CM | POA: Diagnosis not present

## 2023-08-25 DIAGNOSIS — Z6835 Body mass index (BMI) 35.0-35.9, adult: Secondary | ICD-10-CM | POA: Diagnosis not present

## 2023-08-25 DIAGNOSIS — L4050 Arthropathic psoriasis, unspecified: Secondary | ICD-10-CM | POA: Diagnosis not present

## 2023-08-25 DIAGNOSIS — G5601 Carpal tunnel syndrome, right upper limb: Secondary | ICD-10-CM | POA: Diagnosis not present

## 2023-08-25 DIAGNOSIS — R5383 Other fatigue: Secondary | ICD-10-CM | POA: Diagnosis not present

## 2023-08-25 DIAGNOSIS — L409 Psoriasis, unspecified: Secondary | ICD-10-CM | POA: Diagnosis not present

## 2023-08-30 ENCOUNTER — Telehealth: Payer: Self-pay | Admitting: Oncology

## 2023-08-30 NOTE — Telephone Encounter (Signed)
 Kirsten Rodriguez left a message that she recently had blood work done when she saw her rheumatologist and her platelets came back low at 68.  Her rheumatologist is recommending further testing.    Kirsten Rodriguez thinks that she was told by Dr. Lonn that her platelets will always be low. She wants to check with Dr. Lonn to see if this level is normal for her or if she does need more testing.

## 2023-08-30 NOTE — Telephone Encounter (Signed)
 Called Kirsten Rodriguez back and advised her of message from Dr. Lonn.  She asked if we can fax Dr. Buzz notes from earlier this year to her rheumatologist, Rosaline Salt at [facility].

## 2023-08-30 NOTE — Telephone Encounter (Signed)
 This was evaluated and discussed before She has liver cirrhosis and splenomegaly that cannot be fixed; losing weight would help I can meet with her again on Friday to discuss or we can advise her rheumatologist to look at my notes early this year

## 2023-09-05 ENCOUNTER — Other Ambulatory Visit (HOSPITAL_COMMUNITY): Payer: Self-pay

## 2023-09-05 ENCOUNTER — Other Ambulatory Visit: Payer: Self-pay

## 2023-09-14 ENCOUNTER — Other Ambulatory Visit: Payer: Self-pay

## 2023-09-14 ENCOUNTER — Other Ambulatory Visit: Payer: Self-pay | Admitting: Pharmacy Technician

## 2023-09-14 NOTE — Progress Notes (Signed)
 Patient should be on last dose of loading dose but has been filling off schedule. Called to speak with patient about her injection schedule. Will get Kim to adjust PA date for loading dose if needed. Maintenance dose is going through with copay card for $0

## 2023-09-15 ENCOUNTER — Other Ambulatory Visit (HOSPITAL_COMMUNITY): Payer: Self-pay

## 2023-09-15 ENCOUNTER — Other Ambulatory Visit: Payer: Self-pay

## 2023-09-15 ENCOUNTER — Encounter (INDEPENDENT_AMBULATORY_CARE_PROVIDER_SITE_OTHER): Payer: Self-pay

## 2023-09-16 ENCOUNTER — Other Ambulatory Visit: Payer: Self-pay

## 2023-09-20 ENCOUNTER — Other Ambulatory Visit (HOSPITAL_COMMUNITY): Payer: Self-pay

## 2023-09-20 ENCOUNTER — Other Ambulatory Visit: Payer: Self-pay

## 2023-09-21 ENCOUNTER — Other Ambulatory Visit: Payer: Self-pay

## 2023-09-23 ENCOUNTER — Other Ambulatory Visit (HOSPITAL_COMMUNITY): Payer: Self-pay

## 2023-09-23 ENCOUNTER — Other Ambulatory Visit: Payer: Self-pay

## 2023-09-23 NOTE — Progress Notes (Signed)
 Patient did not complete loading dose as prescribed. She completed 2 injections in May, one in June from a sample, and 1 in July. Spoke to patient to start Clinical Intervention and advised she should contact her MD next week to discuss whether she needs to restart loading phase which may require new prior authorization as loading phase PA is expired. She will call me back and let me know the outcome before we proceed.

## 2023-09-26 DIAGNOSIS — E8941 Symptomatic postprocedural ovarian failure: Secondary | ICD-10-CM | POA: Diagnosis not present

## 2023-09-26 DIAGNOSIS — Z6836 Body mass index (BMI) 36.0-36.9, adult: Secondary | ICD-10-CM | POA: Diagnosis not present

## 2023-09-26 DIAGNOSIS — Z23 Encounter for immunization: Secondary | ICD-10-CM | POA: Diagnosis not present

## 2023-09-26 DIAGNOSIS — L400 Psoriasis vulgaris: Secondary | ICD-10-CM | POA: Diagnosis not present

## 2023-09-26 DIAGNOSIS — R52 Pain, unspecified: Secondary | ICD-10-CM | POA: Diagnosis not present

## 2023-09-26 DIAGNOSIS — Z1231 Encounter for screening mammogram for malignant neoplasm of breast: Secondary | ICD-10-CM | POA: Diagnosis not present

## 2023-09-26 DIAGNOSIS — Z0000 Encounter for general adult medical examination without abnormal findings: Secondary | ICD-10-CM | POA: Diagnosis not present

## 2023-09-26 DIAGNOSIS — Z124 Encounter for screening for malignant neoplasm of cervix: Secondary | ICD-10-CM | POA: Diagnosis not present

## 2023-09-26 DIAGNOSIS — I10 Essential (primary) hypertension: Secondary | ICD-10-CM | POA: Diagnosis not present

## 2023-09-26 DIAGNOSIS — E1169 Type 2 diabetes mellitus with other specified complication: Secondary | ICD-10-CM | POA: Diagnosis not present

## 2023-09-27 ENCOUNTER — Other Ambulatory Visit: Payer: Self-pay

## 2023-09-27 NOTE — Progress Notes (Signed)
 Patient returned Jen's call and Wyvonna was out of office for the remainder of the week. Patient had spoken with MD, and it was decided that she would not resume or restart loading dose. Office is providing sample for 8/5 injection, and call will be retimed for next rfill. Patient will inject every 28 days, going forward.

## 2023-10-03 ENCOUNTER — Other Ambulatory Visit: Payer: Self-pay

## 2023-10-03 NOTE — Progress Notes (Signed)
 LVM today for patient to complete intervention before seeing Tamra's note that they had spoken last week. If patient calls back today may tell her to disregard unless she needs to speak further to pharmacist.

## 2023-10-03 NOTE — Progress Notes (Signed)
 Clinical Intervention Note  Clinical Intervention Notes: Patient did not complete loading dose as prescribed. She completed 2 injections in May, one in June from a sample, and 1 in July. Spoke to patient to start Clinical Intervention and advised she should contact her MD next week to discuss whether she needs to restart loading phase which may require new prior authorization as loading phase PA is expired. She will call me back and let me know the outcome before we proceed.     09/27/23 per Tamra's note patient will not resume or restart loading phase and office is providing samples for 8/5 injection. Patient will continue to inject every 28 days going forward. Refill call timed appropriately. No further follow up needed at this time.   Clinical Intervention Outcomes: Improved therapy adherence   Delon CHRISTELLA Brow Specialty Pharmacist

## 2023-10-13 ENCOUNTER — Other Ambulatory Visit: Payer: Self-pay

## 2023-10-19 ENCOUNTER — Other Ambulatory Visit (HOSPITAL_BASED_OUTPATIENT_CLINIC_OR_DEPARTMENT_OTHER): Payer: Self-pay

## 2023-10-19 DIAGNOSIS — Z8543 Personal history of malignant neoplasm of ovary: Secondary | ICD-10-CM | POA: Diagnosis not present

## 2023-10-19 DIAGNOSIS — N951 Menopausal and female climacteric states: Secondary | ICD-10-CM | POA: Diagnosis not present

## 2023-10-19 DIAGNOSIS — F418 Other specified anxiety disorders: Secondary | ICD-10-CM | POA: Diagnosis not present

## 2023-10-19 DIAGNOSIS — K760 Fatty (change of) liver, not elsewhere classified: Secondary | ICD-10-CM | POA: Diagnosis not present

## 2023-10-19 DIAGNOSIS — E1169 Type 2 diabetes mellitus with other specified complication: Secondary | ICD-10-CM | POA: Diagnosis not present

## 2023-10-19 MED ORDER — BUPROPION HCL ER (XL) 150 MG PO TB24
150.0000 mg | ORAL_TABLET | Freq: Every day | ORAL | 2 refills | Status: AC
  Filled 2023-10-19: qty 30, 30d supply, fill #0
  Filled 2023-11-15: qty 30, 30d supply, fill #1

## 2023-10-25 ENCOUNTER — Other Ambulatory Visit: Payer: Self-pay

## 2023-10-25 NOTE — Progress Notes (Signed)
 Specialty Pharmacy Refill Coordination Note  Kirsten Rodriguez is a 48 y.o. female contacted today regarding refills of specialty medication(s) Ixekizumab  (Taltz )   Patient requested Delivery   Delivery date: 10/27/23   Verified address: [address] [place] [address]   Medication will be filled on 10/26/23.

## 2023-10-26 ENCOUNTER — Other Ambulatory Visit: Payer: Self-pay

## 2023-10-26 ENCOUNTER — Other Ambulatory Visit (HOSPITAL_COMMUNITY): Payer: Self-pay

## 2023-10-27 DIAGNOSIS — Z1231 Encounter for screening mammogram for malignant neoplasm of breast: Secondary | ICD-10-CM | POA: Diagnosis not present

## 2023-10-28 ENCOUNTER — Other Ambulatory Visit (HOSPITAL_BASED_OUTPATIENT_CLINIC_OR_DEPARTMENT_OTHER): Payer: Self-pay

## 2023-10-28 MED ORDER — LOSARTAN POTASSIUM-HCTZ 50-12.5 MG PO TABS
1.0000 | ORAL_TABLET | Freq: Every day | ORAL | 3 refills | Status: AC
  Filled 2023-10-28: qty 90, 90d supply, fill #0
  Filled 2024-01-24: qty 90, 90d supply, fill #1

## 2023-11-11 ENCOUNTER — Other Ambulatory Visit (HOSPITAL_COMMUNITY): Payer: Self-pay

## 2023-11-15 ENCOUNTER — Encounter (INDEPENDENT_AMBULATORY_CARE_PROVIDER_SITE_OTHER): Payer: Self-pay

## 2023-11-15 ENCOUNTER — Other Ambulatory Visit (HOSPITAL_COMMUNITY): Payer: Self-pay

## 2023-11-15 ENCOUNTER — Other Ambulatory Visit: Payer: Self-pay

## 2023-11-15 MED ORDER — OZEMPIC (2 MG/DOSE) 8 MG/3ML ~~LOC~~ SOPN
2.0000 mg | PEN_INJECTOR | SUBCUTANEOUS | 0 refills | Status: DC
  Filled 2023-11-15: qty 9, 84d supply, fill #0

## 2023-11-15 NOTE — Progress Notes (Signed)
 Specialty Pharmacy Refill Coordination Note  Kirsten Rodriguez is a 48 y.o. female contacted today regarding refills of specialty medication(s) Ixekizumab  (Taltz )   Patient requested (Patient-Rptd) Delivery   Delivery date: 11/17/23   Verified address: (Patient-Rptd) [address] [place], [place] [address]   Medication will be filled on 11/16/23.

## 2023-11-17 ENCOUNTER — Other Ambulatory Visit (HOSPITAL_COMMUNITY): Payer: Self-pay

## 2023-11-18 ENCOUNTER — Other Ambulatory Visit (HOSPITAL_COMMUNITY): Payer: Self-pay

## 2023-11-18 MED ORDER — SUMATRIPTAN SUCCINATE 100 MG PO TABS
100.0000 mg | ORAL_TABLET | Freq: Every day | ORAL | 3 refills | Status: AC
  Filled 2023-11-18: qty 18, 30d supply, fill #0
  Filled 2024-03-16: qty 18, 30d supply, fill #1

## 2023-11-22 ENCOUNTER — Other Ambulatory Visit (HOSPITAL_COMMUNITY): Payer: Self-pay

## 2023-11-23 ENCOUNTER — Other Ambulatory Visit (HOSPITAL_COMMUNITY): Payer: Self-pay

## 2023-11-29 ENCOUNTER — Other Ambulatory Visit: Payer: Self-pay

## 2023-11-30 ENCOUNTER — Other Ambulatory Visit: Payer: Self-pay

## 2023-12-12 ENCOUNTER — Other Ambulatory Visit (HOSPITAL_COMMUNITY): Payer: Self-pay

## 2023-12-13 ENCOUNTER — Telehealth: Payer: Self-pay

## 2023-12-13 ENCOUNTER — Other Ambulatory Visit: Payer: Self-pay

## 2023-12-13 NOTE — Telephone Encounter (Signed)
 Pharmacy Patient Advocate Encounter   Received notification from Patient Pharmacy that prior authorization for Taltz  is required/requested.   Insurance verification completed.   The patient is insured through [facility].   Per test claim: PA required; PA submitted to above mentioned insurance via Latent Key/confirmation #/EOC [id number] Status is pending

## 2023-12-14 NOTE — Telephone Encounter (Signed)
 Pharmacy Patient Advocate Encounter  Received notification from [facility] that Prior Authorization for Taltz  has been APPROVED from 12/14/23 to 12/12/24   PA #/Case ID/Reference #: [id number]

## 2023-12-16 ENCOUNTER — Other Ambulatory Visit: Payer: Self-pay

## 2023-12-16 DIAGNOSIS — F418 Other specified anxiety disorders: Secondary | ICD-10-CM | POA: Diagnosis not present

## 2023-12-16 DIAGNOSIS — E1169 Type 2 diabetes mellitus with other specified complication: Secondary | ICD-10-CM | POA: Diagnosis not present

## 2023-12-16 DIAGNOSIS — N951 Menopausal and female climacteric states: Secondary | ICD-10-CM | POA: Diagnosis not present

## 2023-12-16 DIAGNOSIS — K760 Fatty (change of) liver, not elsewhere classified: Secondary | ICD-10-CM | POA: Diagnosis not present

## 2023-12-16 DIAGNOSIS — Z8543 Personal history of malignant neoplasm of ovary: Secondary | ICD-10-CM | POA: Diagnosis not present

## 2023-12-19 ENCOUNTER — Encounter (INDEPENDENT_AMBULATORY_CARE_PROVIDER_SITE_OTHER): Payer: Self-pay

## 2023-12-20 ENCOUNTER — Other Ambulatory Visit: Payer: Self-pay | Admitting: Pharmacy Technician

## 2023-12-20 ENCOUNTER — Other Ambulatory Visit: Payer: Self-pay

## 2023-12-20 NOTE — Progress Notes (Signed)
 Specialty Pharmacy Refill Coordination Note  Kirsten Rodriguez is a 48 y.o. female contacted today regarding refills of specialty medication(s) Ixekizumab  (Taltz )   Patient requested (Patient-Rptd) Pickup at [facility] date: (Patient-Rptd) 12/22/23   Medication will be filled on: 12/21/23

## 2023-12-22 ENCOUNTER — Other Ambulatory Visit (HOSPITAL_COMMUNITY): Payer: Self-pay

## 2023-12-22 ENCOUNTER — Other Ambulatory Visit: Payer: Self-pay

## 2023-12-22 MED ORDER — ROSUVASTATIN CALCIUM 5 MG PO TABS
5.0000 mg | ORAL_TABLET | ORAL | 1 refills | Status: AC
  Filled 2023-12-22 – 2024-01-18 (×3): qty 36, 84d supply, fill #0
  Filled 2024-02-28 – 2024-03-16 (×4): qty 36, 84d supply, fill #1

## 2024-01-01 ENCOUNTER — Other Ambulatory Visit (HOSPITAL_COMMUNITY): Payer: Self-pay

## 2024-01-04 ENCOUNTER — Other Ambulatory Visit: Payer: Self-pay

## 2024-01-05 ENCOUNTER — Encounter (HOSPITAL_BASED_OUTPATIENT_CLINIC_OR_DEPARTMENT_OTHER): Payer: Self-pay

## 2024-01-05 ENCOUNTER — Other Ambulatory Visit (HOSPITAL_BASED_OUTPATIENT_CLINIC_OR_DEPARTMENT_OTHER): Payer: Self-pay

## 2024-01-05 ENCOUNTER — Other Ambulatory Visit: Payer: Self-pay

## 2024-01-09 ENCOUNTER — Other Ambulatory Visit: Payer: Self-pay

## 2024-01-09 ENCOUNTER — Other Ambulatory Visit (HOSPITAL_BASED_OUTPATIENT_CLINIC_OR_DEPARTMENT_OTHER): Payer: Self-pay

## 2024-01-10 ENCOUNTER — Other Ambulatory Visit: Payer: Self-pay

## 2024-01-11 ENCOUNTER — Other Ambulatory Visit (HOSPITAL_COMMUNITY): Payer: Self-pay

## 2024-01-11 ENCOUNTER — Other Ambulatory Visit: Payer: Self-pay

## 2024-01-11 NOTE — Progress Notes (Signed)
 Specialty Pharmacy Refill Coordination Note  Kirsten Rodriguez is a 48 y.o. female contacted today regarding refills of specialty medication(s) Ixekizumab  (Taltz )   Patient requested (Patient-Rptd) Pickup at [facility] at [facility] date: (Patient-Rptd) 01/16/24   Medication will be filled on: 01/13/24

## 2024-01-12 ENCOUNTER — Other Ambulatory Visit: Payer: Self-pay

## 2024-01-17 ENCOUNTER — Other Ambulatory Visit: Payer: Self-pay

## 2024-01-17 ENCOUNTER — Other Ambulatory Visit (HOSPITAL_BASED_OUTPATIENT_CLINIC_OR_DEPARTMENT_OTHER): Payer: Self-pay

## 2024-01-18 ENCOUNTER — Other Ambulatory Visit (HOSPITAL_BASED_OUTPATIENT_CLINIC_OR_DEPARTMENT_OTHER): Payer: Self-pay

## 2024-02-06 ENCOUNTER — Other Ambulatory Visit: Payer: Self-pay

## 2024-02-07 ENCOUNTER — Other Ambulatory Visit: Payer: Self-pay

## 2024-02-07 NOTE — Progress Notes (Signed)
 Specialty Pharmacy Refill Coordination Note  Kirsten Rodriguez is a 48 y.o. female contacted today regarding refills of specialty medication(s) Ixekizumab  (Taltz )   Patient requested (Patient-Rptd) Delivery   Delivery date: 02/09/24   Verified address: (Patient-Rptd) [address], [place], [place] [address]   Medication will be filled on: 02/08/24

## 2024-02-08 ENCOUNTER — Other Ambulatory Visit: Payer: Self-pay

## 2024-02-28 ENCOUNTER — Encounter (HOSPITAL_COMMUNITY): Payer: Self-pay

## 2024-02-28 ENCOUNTER — Other Ambulatory Visit (HOSPITAL_COMMUNITY): Payer: Self-pay

## 2024-02-28 ENCOUNTER — Other Ambulatory Visit: Payer: Self-pay

## 2024-02-28 MED ORDER — OZEMPIC (2 MG/DOSE) 8 MG/3ML ~~LOC~~ SOPN
2.0000 mg | PEN_INJECTOR | SUBCUTANEOUS | 0 refills | Status: AC
  Filled 2024-02-28: qty 9, 84d supply, fill #0

## 2024-02-29 ENCOUNTER — Other Ambulatory Visit: Payer: Self-pay

## 2024-03-02 ENCOUNTER — Other Ambulatory Visit (HOSPITAL_COMMUNITY): Payer: Self-pay

## 2024-03-02 ENCOUNTER — Other Ambulatory Visit: Payer: Self-pay | Admitting: Pharmacy Technician

## 2024-03-02 ENCOUNTER — Other Ambulatory Visit: Payer: Self-pay

## 2024-03-07 ENCOUNTER — Other Ambulatory Visit (HOSPITAL_COMMUNITY): Payer: Self-pay

## 2024-03-07 ENCOUNTER — Other Ambulatory Visit: Payer: Self-pay

## 2024-03-09 ENCOUNTER — Other Ambulatory Visit (HOSPITAL_COMMUNITY): Payer: Self-pay

## 2024-03-09 ENCOUNTER — Other Ambulatory Visit: Payer: Self-pay

## 2024-03-09 NOTE — Progress Notes (Signed)
 Specialty Pharmacy Refill Coordination Note  MyChart Questionnaire Submission  Kirsten Rodriguez is a 49 y.o. female contacted today regarding refills of specialty medication(s) Taltz .  Doses on hand: (Patient-Rptd) 0   Next inj: (Patient-Rptd) 03/13/24  Patient requested: (Patient-Rptd) Pickup at [facility] at [facility] date: 03/12/24 (after 12pm)  Medication will be filled on 03/12/24

## 2024-03-12 ENCOUNTER — Other Ambulatory Visit (HOSPITAL_COMMUNITY): Payer: Self-pay

## 2024-03-16 ENCOUNTER — Other Ambulatory Visit: Payer: Self-pay

## 2024-03-19 ENCOUNTER — Other Ambulatory Visit: Payer: Self-pay

## 2024-03-20 ENCOUNTER — Other Ambulatory Visit: Payer: Self-pay

## 2024-03-23 ENCOUNTER — Other Ambulatory Visit: Payer: Self-pay

## 2024-03-23 NOTE — Progress Notes (Signed)
 Specialty Pharmacy Ongoing Clinical Assessment Note  Kirsten Rodriguez is a 49 y.o. female who is being followed by the specialty pharmacy service for RxSp Psoriatic Arthritis   Patient's specialty medication(s) reviewed today: Ixekizumab  (Taltz )   Missed doses in the last 4 weeks: 0   Patient/Caregiver did not have any additional questions or concerns.   Therapeutic benefit summary: Patient is achieving benefit   Adverse events/side effects summary: No adverse events/side effects   Patient's therapy is appropriate to: Continue    Goals Addressed             This Visit's Progress    Maintain optimal adherence to therapy   On track    Patient is on track. Patient will maintain adherence         Follow up: 12 months  [facility]
